# Patient Record
Sex: Male | Born: 1965 | Race: White | Hispanic: No | Smoking: Current every day smoker
Health system: Southern US, Community
[De-identification: ages and names within clinical notes are randomized; demographics above are authoritative.]

## PROBLEM LIST (undated history)

## (undated) DIAGNOSIS — G4730 Sleep apnea, unspecified: Secondary | ICD-10-CM

## (undated) DIAGNOSIS — T33521A Superficial frostbite of right hand, initial encounter: Secondary | ICD-10-CM

## (undated) DIAGNOSIS — K635 Polyp of colon: Secondary | ICD-10-CM

## (undated) DIAGNOSIS — Z21 Asymptomatic human immunodeficiency virus [HIV] infection status: Secondary | ICD-10-CM

## (undated) DIAGNOSIS — B20 Human immunodeficiency virus [HIV] disease: Secondary | ICD-10-CM

## (undated) DIAGNOSIS — T7840XA Allergy, unspecified, initial encounter: Secondary | ICD-10-CM

## (undated) DIAGNOSIS — A630 Anogenital (venereal) warts: Secondary | ICD-10-CM

## (undated) DIAGNOSIS — I10 Essential (primary) hypertension: Secondary | ICD-10-CM

## (undated) DIAGNOSIS — J449 Chronic obstructive pulmonary disease, unspecified: Secondary | ICD-10-CM

## (undated) HISTORY — DX: Allergy, unspecified, initial encounter: T78.40XA

## (undated) HISTORY — DX: Human immunodeficiency virus (HIV) disease: B20

## (undated) HISTORY — DX: Polyp of colon: K63.5

## (undated) HISTORY — DX: Sleep apnea, unspecified: G47.30

## (undated) HISTORY — DX: Essential (primary) hypertension: I10

## (undated) HISTORY — DX: Asymptomatic human immunodeficiency virus (hiv) infection status: Z21

## (undated) HISTORY — DX: Anogenital (venereal) warts: A63.0

## (undated) HISTORY — DX: Chronic obstructive pulmonary disease, unspecified: J44.9

---

## 2018-03-03 ENCOUNTER — Ambulatory Visit: Payer: BLUE CROSS/BLUE SHIELD | Admitting: Adult Health

## 2018-03-03 ENCOUNTER — Encounter: Payer: Self-pay | Admitting: Adult Health

## 2018-03-03 VITALS — BP 124/88 | HR 75 | Temp 98.2°F | Resp 16 | Ht 69.5 in | Wt 180.0 lb

## 2018-03-03 DIAGNOSIS — J209 Acute bronchitis, unspecified: Secondary | ICD-10-CM | POA: Insufficient documentation

## 2018-03-03 DIAGNOSIS — J440 Chronic obstructive pulmonary disease with acute lower respiratory infection: Secondary | ICD-10-CM | POA: Diagnosis not present

## 2018-03-03 DIAGNOSIS — B20 Human immunodeficiency virus [HIV] disease: Secondary | ICD-10-CM

## 2018-03-03 DIAGNOSIS — Z1211 Encounter for screening for malignant neoplasm of colon: Secondary | ICD-10-CM

## 2018-03-03 DIAGNOSIS — F419 Anxiety disorder, unspecified: Secondary | ICD-10-CM

## 2018-03-03 DIAGNOSIS — J0110 Acute frontal sinusitis, unspecified: Secondary | ICD-10-CM

## 2018-03-03 DIAGNOSIS — I10 Essential (primary) hypertension: Secondary | ICD-10-CM

## 2018-03-03 DIAGNOSIS — I270 Primary pulmonary hypertension: Secondary | ICD-10-CM | POA: Insufficient documentation

## 2018-03-03 DIAGNOSIS — B001 Herpesviral vesicular dermatitis: Secondary | ICD-10-CM

## 2018-03-03 DIAGNOSIS — F17219 Nicotine dependence, cigarettes, with unspecified nicotine-induced disorders: Secondary | ICD-10-CM

## 2018-03-03 MED ORDER — VALACYCLOVIR HCL 1 G PO TABS: 1000.0000 mg | ORAL_TABLET | Freq: Two times a day (BID) | ORAL | 1 refills | Status: DC

## 2018-03-03 MED ORDER — HYDROXYZINE HCL 25 MG PO TABS: 25.0000 mg | ORAL_TABLET | Freq: Every evening | ORAL | 0 refills | Status: DC | PRN

## 2018-03-03 MED ORDER — ALBUTEROL SULFATE HFA 108 (90 BASE) MCG/ACT IN AERS: 1.0000 | INHALATION_SPRAY | Freq: Four times a day (QID) | RESPIRATORY_TRACT | 2 refills | Status: DC | PRN

## 2018-03-03 MED ORDER — AMOXICILLIN-POT CLAVULANATE 875-125 MG PO TABS: 1.0000 | ORAL_TABLET | Freq: Two times a day (BID) | ORAL | 0 refills | Status: DC

## 2018-03-03 MED ORDER — LISINOPRIL 10 MG PO TABS: 10.0000 mg | ORAL_TABLET | Freq: Every day | ORAL | 0 refills | Status: DC

## 2018-03-03 NOTE — Progress Notes (Signed)
[facility] [place], [place] [address]  Internal MEDICINE  Office Visit Note  Patient Name: [name]  [id number]  [id number]  Date of Service: 03/03/2018   Complaints/HPI Pt is here for establishment of PCP. Chief Complaint  Patient presents with  . 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 Hypertension  . COPD    needs refills   . Allergies  . Quality Metric Gaps    colonoscopy  . Sinusitis    ongoing sinus infection   HPI Patient is a well-appearing 52 year old Caucasian male.  He reports moving to area a few months ago for a new job.  He moved here alone and does not know anyone have any family in the area.  He is here today to establish primary care.  He reports a medical history that includes: HIV, hypertension, COPD, and allergies.  He also reports some significant anxiety surrounding his medical treatment.  He states that his anxiety has been bad since having to make multiple doctors appointments and get his care established here.  He states he has been much more stressful than he had anticipated.  Patient reports he has been referred to infectious disease at [facility] monitor his HIV.  He states he is been undetectable for the last 9 years and his infectious disease physician in [place] where he moved from referred him to [facility].  He is already been in establish care with them they will continue to write his antiviral medications.  He is in need of a colonoscopy to close his quality metric gaps he is agreeable to this at this visit.  His main complaint today is sinus infection as well as ongoing allergy symptoms.  He reports multiple weeks of sinus pain and pressure as well as rhinitis and postnasal drip.  He reports he is been taking some over-the-counter medications with no relief.  Current Medication: Outpatient Encounter Medications as of 03/03/2018  Medication Sig  . albuterol (VENTOLIN HFA) 108 (90 Base) MCG/ACT inhaler Inhale 1-2 puffs into the  lungs every 6 (six) hours as needed for wheezing or shortness of breath.  . lisinopril (PRINIVIL,ZESTRIL) 10 MG tablet Take 1 tablet (10 mg total) by mouth daily.  . mirtazapine (REMERON) 15 MG tablet   . TRIUMEQ 600-50-300 MG tablet   . valACYclovir (VALTREX) 1000 MG tablet Take 1 tablet (1,000 mg total) by mouth 2 (two) times daily.  . [DISCONTINUED] albuterol (VENTOLIN HFA) 108 (90 Base) MCG/ACT inhaler Inhale 1-2 puffs into the lungs every 6 (six) hours as needed for wheezing or shortness of breath.  . [DISCONTINUED] lisinopril (PRINIVIL,ZESTRIL) 10 MG tablet   . [DISCONTINUED] valACYclovir (VALTREX) 1000 MG tablet   . amoxicillin-clavulanate (AUGMENTIN) 875-125 MG tablet Take 1 tablet by mouth 2 (two) times daily.  . hydrOXYzine (ATARAX/VISTARIL) 25 MG tablet Take 1 tablet (25 mg total) by mouth at bedtime as needed.   No facility-administered encounter medications on file as of 03/03/2018.     Surgical History: History reviewed. No pertinent surgical history.  Medical History: Past Medical History:  Diagnosis Date  . Allergy   . COPD (chronic obstructive pulmonary disease) ([place])   . HIV (human immunodeficiency virus infection) ([place])   . Hypertension     Family History: Family History  Adopted: Yes    Social History   Socioeconomic History  . Marital status: Divorced    Spouse name: Not on file  . Number of children: Not on file  . Years of  education: Not on file  . Highest education level: Not on file  Occupational History  . Not on file  Social Needs  . Financial resource strain: Not on file  . Food insecurity:    Worry: Not on file    Inability: Not on file  . Transportation needs:    Medical: Not on file    Non-medical: Not on file  Tobacco Use  . Smoking status: Current Every Day Smoker    Packs/day: 1.00    Types: Cigarettes  . Smokeless tobacco: Never Used  Substance and Sexual Activity  . Alcohol use: Yes  . Drug use: Never  . Sexual activity: Not  on file  Lifestyle  . Physical activity:    Days per week: Not on file    Minutes per session: Not on file  . Stress: Not on file  Relationships  . Social connections:    Talks on phone: Not on file    Gets together: Not on file    Attends religious service: Not on file    Active member of club or organization: Not on file    Attends meetings of clubs or organizations: Not on file    Relationship status: Not on file  . Intimate partner violence:    Fear of current or ex partner: Not on file    Emotionally abused: Not on file    Physically abused: Not on file    Forced sexual activity: Not on file  Other Topics Concern  . Not on file  Social History Narrative  . Not on file     Review of Systems  Constitutional: Negative.  Negative for chills, fatigue and unexpected weight change.  HENT: Positive for sinus pressure and sinus pain. Negative for congestion, rhinorrhea, sneezing and sore throat.   Eyes: Negative for redness.  Respiratory: Negative.  Negative for cough, chest tightness and shortness of breath.   Cardiovascular: Negative.  Negative for chest pain and palpitations.  Gastrointestinal: Negative.  Negative for abdominal pain, constipation, diarrhea, nausea and vomiting.  Endocrine: Negative.   Genitourinary: Negative.  Negative for dysuria and frequency.  Musculoskeletal: Negative.  Negative for arthralgias, back pain, joint swelling and neck pain.  Skin: Negative.  Negative for rash.  Allergic/Immunologic: Negative.   Neurological: Negative.  Negative for tremors and numbness.  Hematological: Negative for adenopathy. Does not bruise/bleed easily.  Psychiatric/Behavioral: Negative.  Negative for behavioral problems, sleep disturbance and suicidal ideas. The patient is not nervous/anxious.     Vital Signs: BP 124/88 (BP Location: Left Arm, Patient Position: Sitting, Cuff Size: Normal)   Pulse 75   Temp 98.2 F (36.8 C) (Oral)   Resp 16   Ht 5' 9.5" (1.765 m)    Wt 180 lb (81.6 kg)   SpO2 97%   BMI 26.20 kg/m    Physical Exam  Constitutional: He is oriented to person, place, and time. He appears well-developed and well-nourished. No distress.  HENT:  Head: Normocephalic and atraumatic.  Mouth/Throat: Oropharynx is clear and moist. No oropharyngeal exudate.  Eyes: Pupils are equal, round, and reactive to light. EOM are normal.  Neck: Normal range of motion. Neck supple. No JVD present. No tracheal deviation present. No thyromegaly present.  Cardiovascular: Normal rate, regular rhythm and normal heart sounds. Exam reveals no gallop and no friction rub.  No murmur heard. Pulmonary/Chest: Effort normal and breath sounds normal. No respiratory distress. He has no wheezes. He has no rales. He exhibits no tenderness.  Abdominal: Soft. There  is no tenderness. There is no guarding.  Musculoskeletal: Normal range of motion.  Lymphadenopathy:    He has no cervical adenopathy.  Neurological: He is alert and oriented to person, place, and time. No cranial nerve deficit.  Skin: Skin is warm and dry. He is not diaphoretic.  Psychiatric: He has a normal mood and affect. His behavior is normal. Judgment and thought content normal.  Nursing note and vitals reviewed.  Assessment/Plan: 1. Anxiety Patient reports he has been taking Remeron at night for his anxiety, however he does not feel this is working had to stop taking it a few months ago.  He states that he had been on clonazepam at some point at night for his anxiety however he does not want to take any from the strong.  We discussed a few options including SSRIs but the patient settled on taking a small dose of hydroxyzine and see how that does for him.  He reports difficulty falling asleep and staying asleep due to his anxiety and feels like the hydroxyzine will help him with his sleeping as well as anxiety.  2. Acute non-recurrent frontal sinusitis Patient reports feeling feverish and chilly at times over  the last few days.  No matter how much over-the-counter medication he takes he still has sinus drainage and pressure.  Given the symptoms a couple of Augmentin is prescribed.  Patient is instructed to return to clinic if symptoms do not improve in the next 5 to 7 days. - amoxicillin-clavulanate (AUGMENTIN) 875-125 MG tablet; Take 1 tablet by mouth 2 (two) times daily.  Dispense: 20 tablet; Refill: 0  3. HIV infection, unspecified symptom status ([place]) Patient will see [facility] infectious disease for ongoing HIV surveillance.  Per patient is currently undetectable and is on the following medication. - TRIUMEQ 600-50-300 MG tablet  4. Acute bronchitis with COPD [facility]) Patient reports using her inhaler for his COPD.  Unfortunately he does continue to smoke greater than 1 pack of cigarettes per day.  Albuterol inhaler refilled at this time. - albuterol (VENTOLIN HFA) 108 (90 Base) MCG/ACT inhaler; Inhale 1-2 puffs into the lungs every 6 (six) hours as needed for wheezing or shortness of breath.  Dispense: 1 Inhaler; Refill: 2  5. 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  He keeps a prescription for valacyclovir on hand for these outbreak.  He reports his last outbreak approximately 2 months ago.  Patient Valtrex refilled at this time. - valACYclovir (VALTREX) 1000 MG tablet; Take 1 tablet (1,000 mg total) by mouth 2 (two) times daily.  Dispense: 20 tablet; Refill: 1  6. Hypertension, unspecified type Refill patient's lisinopril.  Blood pressure appears stable at this time. - lisinopril (PRINIVIL,ZESTRIL) 10 MG tablet; Take 1 tablet (10 mg total) by mouth daily.  Dispense: 30 tablet; Refill: 0  7. Screen for colon cancer Referral for GI place the patient could have screening colonoscopy. - Ambulatory referral to Gastroenterology  8. Cigarette nicotine dependence with nicotine-induced disorder Patient reports smoking just over 1 pack of cigarette daily.  He reports his anxiety is  to have a time for him to try to quit and he hopes to start the quitting process once he has stabilized all his medical appointments and has establish his routine locally. Smoking cessation counseling: 1. Pt acknowledges the risks of long term smoking, she will try to quite smoking. 2. Options for different medications including nicotine products, chewing gum, patch etc, Wellbutrin and Chantix is discussed 3. Goal  and date of compete cessation is discussed 4. Total time spent in smoking cessation is 15 min.   General Counseling: Marylou Mccoy understanding of the findings of todays visit and agrees with plan of treatment. I have discussed any further diagnostic evaluation that may be needed or ordered today. We also reviewed his medications today. he has been encouraged to call the office with any questions or concerns that should arise related to todays visit.  Orders Placed This Encounter  Procedures  . CBC with Differential/Platelet  . Lipid Panel With LDL/HDL Ratio  . TSH  . T4, free  . Comprehensive metabolic panel  . PSA  . Ambulatory referral to Gastroenterology    Meds ordered this encounter  Medications  . albuterol (VENTOLIN HFA) 108 (90 Base) MCG/ACT inhaler    Sig: Inhale 1-2 puffs into the lungs every 6 (six) hours as needed for wheezing or shortness of breath.    Dispense:  1 Inhaler    Refill:  2  . amoxicillin-clavulanate (AUGMENTIN) 875-125 MG tablet    Sig: Take 1 tablet by mouth 2 (two) times daily.    Dispense:  20 tablet    Refill:  0  . lisinopril (PRINIVIL,ZESTRIL) 10 MG tablet    Sig: Take 1 tablet (10 mg total) by mouth daily.    Dispense:  30 tablet    Refill:  0  . valACYclovir (VALTREX) 1000 MG tablet    Sig: Take 1 tablet (1,000 mg total) by mouth 2 (two) times daily.    Dispense:  20 tablet    Refill:  1  . hydrOXYzine (ATARAX/VISTARIL) 25 MG tablet    Sig: Take 1 tablet (25 mg total) by mouth at bedtime as needed.    Dispense:  30 tablet     Refill:  0    Time spent: 25 Minutes   This patient was seen by Orson Gear AGNP-C in Collaboration with Dr Lavera Guise as a part of collaborative care agreement  Kendell Bane AGNP-C Internal Medicine

## 2018-03-03 NOTE — Patient Instructions (Signed)

## 2018-03-11 ENCOUNTER — Encounter: Payer: Self-pay | Admitting: Adult Health

## 2018-03-24 ENCOUNTER — Encounter: Payer: Self-pay | Admitting: *Deleted

## 2018-03-24 DIAGNOSIS — Z113 Encounter for screening for infections with a predominantly sexual mode of transmission: Secondary | ICD-10-CM | POA: Diagnosis not present

## 2018-03-24 DIAGNOSIS — I10 Essential (primary) hypertension: Secondary | ICD-10-CM | POA: Diagnosis not present

## 2018-03-24 DIAGNOSIS — N50819 Testicular pain, unspecified: Secondary | ICD-10-CM | POA: Diagnosis not present

## 2018-03-24 DIAGNOSIS — Z720 Tobacco use: Secondary | ICD-10-CM | POA: Diagnosis not present

## 2018-03-24 DIAGNOSIS — B20 Human immunodeficiency virus [HIV] disease: Secondary | ICD-10-CM | POA: Diagnosis not present

## 2018-03-24 DIAGNOSIS — Z23 Encounter for immunization: Secondary | ICD-10-CM | POA: Diagnosis not present

## 2018-03-26 ENCOUNTER — Other Ambulatory Visit: Payer: Self-pay | Admitting: Adult Health

## 2018-03-26 DIAGNOSIS — F419 Anxiety disorder, unspecified: Secondary | ICD-10-CM

## 2018-03-26 MED ORDER — HYDROXYZINE HCL 25 MG PO TABS: 25.0000 mg | ORAL_TABLET | Freq: Every evening | ORAL | 1 refills | Status: DC | PRN

## 2018-04-27 ENCOUNTER — Encounter: Payer: Self-pay | Admitting: Adult Health

## 2018-04-27 ENCOUNTER — Ambulatory Visit (INDEPENDENT_AMBULATORY_CARE_PROVIDER_SITE_OTHER): Payer: BC Managed Care – PPO | Admitting: Adult Health

## 2018-04-27 VITALS — BP 126/86 | HR 79 | Resp 16 | Ht 69.5 in | Wt 178.8 lb

## 2018-04-27 DIAGNOSIS — Z0001 Encounter for general adult medical examination with abnormal findings: Secondary | ICD-10-CM | POA: Diagnosis not present

## 2018-04-27 DIAGNOSIS — I10 Essential (primary) hypertension: Secondary | ICD-10-CM

## 2018-04-27 DIAGNOSIS — R300 Dysuria: Secondary | ICD-10-CM

## 2018-04-27 DIAGNOSIS — H6123 Impacted cerumen, bilateral: Secondary | ICD-10-CM | POA: Diagnosis not present

## 2018-04-27 DIAGNOSIS — Z1211 Encounter for screening for malignant neoplasm of colon: Secondary | ICD-10-CM

## 2018-04-27 DIAGNOSIS — F1010 Alcohol abuse, uncomplicated: Secondary | ICD-10-CM

## 2018-04-27 DIAGNOSIS — F419 Anxiety disorder, unspecified: Secondary | ICD-10-CM

## 2018-04-27 DIAGNOSIS — B20 Human immunodeficiency virus [HIV] disease: Secondary | ICD-10-CM

## 2018-04-27 DIAGNOSIS — F17209 Nicotine dependence, unspecified, with unspecified nicotine-induced disorders: Secondary | ICD-10-CM | POA: Diagnosis not present

## 2018-04-27 MED ORDER — MIRTAZAPINE 15 MG PO TABS: 15.0000 mg | ORAL_TABLET | Freq: Every day | ORAL | 3 refills | Status: DC

## 2018-04-27 MED ORDER — CARBAMIDE PEROXIDE 6.5 % OT SOLN: 5.0000 [drp] | Freq: Two times a day (BID) | OTIC | 0 refills | Status: DC

## 2018-04-27 NOTE — Patient Instructions (Signed)
Coping with Quitting Smoking  Quitting smoking is a physical and mental challenge. You will face cravings, withdrawal symptoms, and temptation. Before quitting, work with your health care provider to make a plan that can help you cope. Preparation can help you quit and keep you from giving in. How can I cope with cravings? Cravings usually last for 5-10 minutes. If you get through it, the craving will pass. Consider taking the following actions to help you cope with cravings:  Keep your mouth busy: ? Chew sugar-free gum. ? Suck on hard candies or a straw. ? Brush your teeth.  Keep your hands and body busy: ? Immediately change to a different activity when you feel a craving. ? Squeeze or play with a ball. ? Do an activity or a hobby, like making bead jewelry, practicing needlepoint, or working with wood. ? Mix up your normal routine. ? Take a short exercise break. Go for a quick walk or run up and down stairs. ? Spend time in public places where smoking is not allowed.  Focus on doing something kind or helpful for someone else.  Call a friend or family member to talk during a craving.  Join a support group.  Call a quit line, such as 1-800-QUIT-NOW.  Talk with your health care provider about medicines that might help you cope with cravings and make quitting easier for you. How can I deal with withdrawal symptoms? Your body may experience negative effects as it tries to get used to not having nicotine in the system. These effects are called withdrawal symptoms. They may include:  Feeling hungrier than normal.  Trouble concentrating.  Irritability.  Trouble sleeping.  Feeling depressed.  Restlessness and agitation.  Craving a cigarette. To manage withdrawal symptoms:  Avoid places, people, and activities that trigger your cravings.  Remember why you want to quit.  Get plenty of sleep.  Avoid coffee and other caffeinated drinks. These may worsen some of your symptoms.  How can I handle social situations? Social situations can be difficult when you are quitting smoking, especially in the first few weeks. To manage this, you can:  Avoid parties, bars, and other social situations where people might be smoking.  Avoid alcohol.  Leave right away if you have the urge to smoke.  Explain to your family and friends that you are quitting smoking. Ask for understanding and support.  Plan activities with friends or family where smoking is not an option. What are some ways I can cope with stress? Wanting to smoke may cause stress, and stress can make you want to smoke. Find ways to manage your stress. Relaxation techniques can help. For example:  Breathe slowly and deeply, in through your nose and out through your mouth.  Listen to soothing, relaxing music.  Talk with a family member or friend about your stress.  Light a candle.  Soak in a bath or take a shower.  Think about a peaceful place. What are some ways I can prevent weight gain? Be aware that many people gain weight after they quit smoking. However, not everyone does. To keep from gaining weight, have a plan in place before you quit and stick to the plan after you quit. Your plan should include:  Having healthy snacks. When you have a craving, it may help to: ? Eat plain popcorn, crunchy carrots, celery, or other cut vegetables. ? Chew sugar-free gum.  Changing how you eat: ? Eat small portion sizes at meals. ? Eat 4-6 small meals   throughout the day instead of 1-2 large meals a day. ? Be mindful when you eat. Do not watch television or do other things that might distract you as you eat.  Exercising regularly: ? Make time to exercise each day. If you do not have time for a long workout, do short bouts of exercise for 5-10 minutes several times a day. ? Do some form of strengthening exercise, like weight lifting, and some form of aerobic exercise, like running or swimming.  Drinking plenty of  water or other low-calorie or no-calorie drinks. Drink 6-8 glasses of water daily, or as much as instructed by your health care provider. Summary  Quitting smoking is a physical and mental challenge. You will face cravings, withdrawal symptoms, and temptation to smoke again. Preparation can help you as you go through these challenges.  You can cope with cravings by keeping your mouth busy (such as by chewing gum), keeping your body and hands busy, and making calls to family, friends, or a helpline for people who want to quit smoking.  You can cope with withdrawal symptoms by avoiding places where people smoke, avoiding drinks with caffeine, and getting plenty of rest.  Ask your health care provider about the different ways to prevent weight gain, avoid stress, and handle social situations. This information is not intended to replace advice given to you by your health care provider. Make sure you discuss any questions you have with your health care provider. Document Released: 04/05/2016 Document Revised: 04/05/2016 Document Reviewed: 04/05/2016 [employer] Interactive Patient Education  2019 [employer]  

## 2018-04-27 NOTE — Progress Notes (Signed)
[facility] [place], [place] [address]  Internal MEDICINE  Office Visit Note  Patient Name: [name]  [id number]  [id number]  Date of Service: 04/27/2018  Chief Complaint  Patient presents with  . Annual Exam  . HIV Positive/AIDS  . Hypertension  . COPD  . Quality Metric Gaps    colonoscopy     HPI Pt is here for routine health maintenance examination.  He is a well appearing 53 yo male. He has a history of HIV for over 10 years.  He also has HTN, COPD.  Generally he is doing well and denies any issues currently.  His blood pressures well controlled on current medications.  He has just seen infectious disease last month at [facility] and his HIV remains undetectable.  He denies any issues with his breathing however he does report that he smokes over 1 pack of cigarettes per day still.  He also reports that he drinks approximately 5 drinks every Saturday and Sunday.  He typically does not drink during the week.  He is in need of a colonoscopy to close his quality metric gaps. He has a referral already, and needs to reschedule, as he could not make his first appt.    Current Medication: Outpatient Encounter Medications as of 04/27/2018  Medication Sig  . albuterol (VENTOLIN HFA) 108 (90 Base) MCG/ACT inhaler Inhale 1-2 puffs into the lungs every 6 (six) hours as needed for wheezing or shortness of breath.  . bictegravir-emtricitabine-tenofovir AF (BIKTARVY) 50-200-25 MG TABS tablet Take by mouth.  . hydrOXYzine (ATARAX/VISTARIL) 25 MG tablet Take 1 tablet (25 mg total) by mouth at bedtime as needed.  Marland Kitchen lisinopril (PRINIVIL,ZESTRIL) 10 MG tablet Take 1 tablet (10 mg total) by mouth daily.  . mirtazapine (REMERON) 15 MG tablet Take 1 tablet (15 mg total) by mouth at bedtime.  . valACYclovir (VALTREX) 1000 MG tablet Take 1 tablet (1,000 mg total) by mouth 2 (two) times daily.  . [DISCONTINUED] mirtazapine (REMERON) 15 MG tablet   . amoxicillin-clavulanate (AUGMENTIN)  875-125 MG tablet Take 1 tablet by mouth 2 (two) times daily. (Patient not taking: Reported on 04/27/2018)  . carbamide peroxide (DEBROX) 6.5 % OTIC solution Place 5 drops into both ears 2 (two) times daily.  Perlie Gold 810-17-510 MG tablet    No facility-administered encounter medications on file as of 04/27/2018.     Surgical History: History reviewed. No pertinent surgical history.  Medical History: Past Medical History:  Diagnosis Date  . Allergy   . COPD (chronic obstructive pulmonary disease) ([place])   . HIV (human immunodeficiency virus infection) ([place])   . Hypertension     Family History: Family History  Adopted: Yes      Review of Systems  Constitutional: Negative.  Negative for chills, fatigue and unexpected weight change.  HENT: Negative.  Negative for congestion, rhinorrhea, sneezing and sore throat.   Eyes: Negative for redness.  Respiratory: Negative.  Negative for cough, chest tightness and shortness of breath.   Cardiovascular: Negative.  Negative for chest pain and palpitations.  Gastrointestinal: Negative.  Negative for abdominal pain, constipation, diarrhea, nausea and vomiting.  Endocrine: Negative.   Genitourinary: Negative.  Negative for dysuria and frequency.  Musculoskeletal: Negative.  Negative for arthralgias, back pain, joint swelling and neck pain.  Skin: Negative.  Negative for rash.  Allergic/Immunologic: Negative.   Neurological: Negative.  Negative for tremors and numbness.  Hematological: Negative for adenopathy. Does not bruise/bleed easily.  Psychiatric/Behavioral: Negative.  Negative for  behavioral problems, sleep disturbance and suicidal ideas. The patient is not nervous/anxious.      Vital Signs: BP 126/86   Pulse 79   Resp 16   Ht 5' 9.5" (1.765 m)   Wt 178 lb 12.8 oz (81.1 kg)   SpO2 96%   BMI 26.03 kg/m    Physical Exam Vitals signs and nursing note reviewed.  Constitutional:      General: He is not in acute distress.     Appearance: He is well-developed. He is not diaphoretic.  HENT:     Head: Normocephalic and atraumatic.     Mouth/Throat:     Pharynx: No oropharyngeal exudate.  Eyes:     Pupils: Pupils are equal, round, and reactive to light.  Neck:     Musculoskeletal: Normal range of motion and neck supple.     Thyroid: No thyromegaly.     Vascular: No JVD.     Trachea: No tracheal deviation.  Cardiovascular:     Rate and Rhythm: Normal rate and regular rhythm.     Heart sounds: Normal heart sounds. No murmur. No friction rub. No gallop.   Pulmonary:     Effort: Pulmonary effort is normal. No respiratory distress.     Breath sounds: Normal breath sounds. No wheezing or rales.  Chest:     Chest wall: No tenderness.  Abdominal:     Palpations: Abdomen is soft.     Tenderness: There is no abdominal tenderness. There is no guarding.  Genitourinary:    Rectum: Normal.  Musculoskeletal: Normal range of motion.  Lymphadenopathy:     Cervical: No cervical adenopathy.  Skin:    General: Skin is warm and dry.  Neurological:     Mental Status: He is alert and oriented to person, place, and time.     Cranial Nerves: No cranial nerve deficit.  Psychiatric:        Behavior: Behavior normal.        Thought Content: Thought content normal.        Judgment: Judgment normal.     LABS: No results found for this or any previous visit (from the past 2160 hour(s)).  Assessment/Plan: 1. Encounter for general adult medical examination with abnormal findings Patient is been on preventative health maintenance except for colonoscopy which he is attempting to schedule at this time. - Lipid Panel With LDL/HDL Ratio - TSH - T4, free - Comprehensive metabolic panel - PSA  2. HIV infection, unspecified symptom status ([place]) Patient is being followed [facility] infectious disease at [facility].  His current viral load is undetectable.  He will continue take his medications and followed by infectious disease. -  bictegravir-emtricitabine-tenofovir AF (BIKTARVY) 50-200-25 MG TABS tablet; Take by mouth.  3. Hypertension, unspecified type Stable, continue current medications.  4. Bilateral impacted cerumen Patient provided with Debrox drops.  He has bilateral impacted ear canals.  Instructed patient to use drops as directed for 710 days and then he can return to office for ear irrigation. - carbamide peroxide (DEBROX) 6.5 % OTIC solution; Place 5 drops into both ears 2 (two) times daily.  Dispense: 15 mL; Refill: 0  5. Nicotine dependence with nicotine-induced disorder, unspecified nicotine product type Unfortunately patient continues to smoke greater than 1 pack of cigarettes per day. Smoking cessation counseling: 1. Pt acknowledges the risks of long term smoking, she will try to quite smoking. 2. Options for different medications including nicotine products, chewing gum, patch etc, Wellbutrin and Chantix is discussed  3. Goal and date of compete cessation is discussed 4. Total time spent in smoking cessation is 15 min.   6. Screen for colon cancer Patient has referral for colonoscopy.  He had to reschedule since he cannot make his first appointment.  Patient reports he will do that at this time.  7. Alcohol consumption binge drinking Patient reports drinking 5 mixed drinks on Saturday and 5 mixed drinks on Sunday every weekend.  He does not feel like he has an alcohol problem or that he needs to cut down at this time.  8. Anxiety Renewed patient's Remeron. - mirtazapine (REMERON) 15 MG tablet; Take 1 tablet (15 mg total) by mouth at bedtime.  Dispense: 30 tablet; Refill: 3  9. Dysuria - UA/M w/rflx Culture, Routine  General Counseling: Desmen verbalizes understanding of the findings of todays visit and agrees with plan of treatment. I have discussed any further diagnostic evaluation that may be needed or ordered today. We also reviewed his medications today. he has been encouraged to call the  office with any questions or concerns that should arise related to todays visit.   Orders Placed This Encounter  Procedures  . UA/M w/rflx Culture, Routine  . Lipid Panel With LDL/HDL Ratio  . TSH  . T4, free  . Comprehensive metabolic panel  . PSA    Meds ordered this encounter  Medications  . carbamide peroxide (DEBROX) 6.5 % OTIC solution    Sig: Place 5 drops into both ears 2 (two) times daily.    Dispense:  15 mL    Refill:  0  . mirtazapine (REMERON) 15 MG tablet    Sig: Take 1 tablet (15 mg total) by mouth at bedtime.    Dispense:  30 tablet    Refill:  3    Time spent: 35 Minutes   This patient was seen by Orson Gear AGNP-C in Collaboration with Dr Lavera Guise as a part of collaborative care agreement    Kendell Bane AGNP-C Internal Medicine

## 2018-04-28 LAB — UA/M W/RFLX CULTURE, ROUTINE
Bilirubin, UA: NEGATIVE
Glucose, UA: NEGATIVE
Ketones, UA: NEGATIVE
LEUKOCYTES UA: NEGATIVE
Nitrite, UA: NEGATIVE
PH UA: 5.5 (ref 5.0–7.5)
PROTEIN UA: NEGATIVE
RBC UA: NEGATIVE
SPEC GRAV UA: 1.011 (ref 1.005–1.030)
Urobilinogen, Ur: 0.2 mg/dL (ref 0.2–1.0)

## 2018-04-28 LAB — MICROSCOPIC EXAMINATION
Bacteria, UA: NONE SEEN
CASTS: NONE SEEN /LPF
Epithelial Cells (non renal): NONE SEEN /hpf (ref 0–10)

## 2018-05-06 DIAGNOSIS — Z0001 Encounter for general adult medical examination with abnormal findings: Secondary | ICD-10-CM | POA: Diagnosis not present

## 2018-05-07 DIAGNOSIS — Z6825 Body mass index (BMI) 25.0-25.9, adult: Secondary | ICD-10-CM | POA: Diagnosis not present

## 2018-05-07 DIAGNOSIS — Z23 Encounter for immunization: Secondary | ICD-10-CM | POA: Diagnosis not present

## 2018-05-07 DIAGNOSIS — B20 Human immunodeficiency virus [HIV] disease: Secondary | ICD-10-CM | POA: Diagnosis not present

## 2018-05-07 LAB — COMPREHENSIVE METABOLIC PANEL
ALT: 19 IU/L (ref 0–44)
AST: 34 IU/L (ref 0–40)
Albumin/Globulin Ratio: 1.8 (ref 1.2–2.2)
Albumin: 4.4 g/dL (ref 3.5–5.5)
Alkaline Phosphatase: 67 IU/L (ref 39–117)
BUN/Creatinine Ratio: 9 (ref 9–20)
BUN: 9 mg/dL (ref 6–24)
Bilirubin Total: 1 mg/dL (ref 0.0–1.2)
CO2: 24 mmol/L (ref 20–29)
Calcium: 8.8 mg/dL (ref 8.7–10.2)
Chloride: 103 mmol/L (ref 96–106)
Creatinine, Ser: 0.98 mg/dL (ref 0.76–1.27)
GFR calc Af Amer: 103 mL/min/{1.73_m2} (ref >59)
GFR calc non Af Amer: 90 mL/min/{1.73_m2} (ref >59)
Globulin, Total: 2.5 g/dL (ref 1.5–4.5)
Glucose: 82 mg/dL (ref 65–99)
Potassium: 4.2 mmol/L (ref 3.5–5.2)
Sodium: 141 mmol/L (ref 134–144)
Total Protein: 6.9 g/dL (ref 6.0–8.5)

## 2018-05-07 LAB — LIPID PANEL WITH LDL/HDL RATIO
Cholesterol, Total: 148 mg/dL (ref 100–199)
HDL: 60 mg/dL (ref >39)
LDL Calculated: 40 mg/dL (ref 0–99)
LDL/HDL RATIO: 0.7 ratio (ref 0.0–3.6)
Triglycerides: 242 mg/dL — ABNORMAL HIGH (ref 0–149)
VLDL Cholesterol Cal: 48 mg/dL — ABNORMAL HIGH (ref 5–40)

## 2018-05-07 LAB — PSA: Prostate Specific Ag, Serum: 0.8 ng/mL (ref 0.0–4.0)

## 2018-05-07 LAB — TSH: TSH: 1.25 u[IU]/mL (ref 0.450–4.500)

## 2018-05-07 LAB — T4, FREE: Free T4: 1.23 ng/dL (ref 0.82–1.77)

## 2018-05-21 ENCOUNTER — Other Ambulatory Visit: Payer: Self-pay

## 2018-05-21 DIAGNOSIS — F419 Anxiety disorder, unspecified: Secondary | ICD-10-CM

## 2018-05-21 MED ORDER — MIRTAZAPINE 15 MG PO TABS: 15.0000 mg | ORAL_TABLET | Freq: Every day | ORAL | 1 refills | Status: DC

## 2018-06-20 ENCOUNTER — Other Ambulatory Visit: Payer: Self-pay | Admitting: Adult Health

## 2018-06-20 DIAGNOSIS — B001 Herpesviral vesicular dermatitis: Secondary | ICD-10-CM

## 2018-06-22 ENCOUNTER — Telehealth: Payer: Self-pay | Admitting: Gastroenterology

## 2018-06-22 NOTE — Telephone Encounter (Signed)
LVM for pt to call office to schedule colonoscopy.  Thanks [name] 

## 2018-06-22 NOTE — Telephone Encounter (Signed)
Patient returned called after receiving letter to schedule procedure.

## 2018-07-06 ENCOUNTER — Other Ambulatory Visit: Payer: Self-pay

## 2018-07-06 ENCOUNTER — Encounter: Payer: Self-pay | Admitting: Adult Health

## 2018-07-06 ENCOUNTER — Ambulatory Visit: Payer: BC Managed Care – PPO | Admitting: Adult Health

## 2018-07-06 VITALS — BP 150/86 | HR 87 | Temp 98.5°F | Resp 16 | Ht 69.5 in | Wt 173.0 lb

## 2018-07-06 DIAGNOSIS — F17209 Nicotine dependence, unspecified, with unspecified nicotine-induced disorders: Secondary | ICD-10-CM

## 2018-07-06 DIAGNOSIS — I10 Essential (primary) hypertension: Secondary | ICD-10-CM

## 2018-07-06 DIAGNOSIS — J0110 Acute frontal sinusitis, unspecified: Secondary | ICD-10-CM | POA: Diagnosis not present

## 2018-07-06 DIAGNOSIS — T7840XA Allergy, unspecified, initial encounter: Secondary | ICD-10-CM

## 2018-07-06 MED ORDER — AMOXICILLIN-POT CLAVULANATE 875-125 MG PO TABS: 1.0000 | ORAL_TABLET | Freq: Two times a day (BID) | ORAL | 0 refills | Status: DC

## 2018-07-06 NOTE — Progress Notes (Signed)
[facility] [place], [place] [address]  Internal MEDICINE  Office Visit Note  Patient Name: [name]  [id number]  [id number]  Date of Service: 07/06/2018  Chief Complaint  Patient presents with  . Sinusitis    chills and sweating no fever      HPI Pt is here for a sick visit. Pt reports he was at the gym and noticed he felt chilled, and started sweating. He was on the treadmill at the time. He has felt feverish since.  He is concerned because of his history of HIV. He came in today to have his temperature checked. He denies ingesting any new substances, or foods.  His medications have not changed.    Current Medication:  Outpatient Encounter Medications as of 07/06/2018  Medication Sig  . albuterol (VENTOLIN HFA) 108 (90 Base) MCG/ACT inhaler Inhale 1-2 puffs into the lungs every 6 (six) hours as needed for wheezing or shortness of breath.  . bictegravir-emtricitabine-tenofovir AF (BIKTARVY) 50-200-25 MG TABS tablet Take by mouth.  . carbamide peroxide (DEBROX) 6.5 % OTIC solution Place 5 drops into both ears 2 (two) times daily.  . hydrOXYzine (ATARAX/VISTARIL) 25 MG tablet Take 1 tablet (25 mg total) by mouth at bedtime as needed.  Marland Kitchen lisinopril (PRINIVIL,ZESTRIL) 10 MG tablet Take 1 tablet (10 mg total) by mouth daily.  . mirtazapine (REMERON) 15 MG tablet Take 1 tablet (15 mg total) by mouth at bedtime.  . TRIUMEQ 600-50-300 MG tablet   . valACYclovir (VALTREX) 1000 MG tablet TAKE 1 TABLET BY MOUTH TWICE A DAY  . [DISCONTINUED] amoxicillin-clavulanate (AUGMENTIN) 875-125 MG tablet Take 1 tablet by mouth 2 (two) times daily. (Patient not taking: Reported on 04/27/2018)   No facility-administered encounter medications on file as of 07/06/2018.       Medical History: Past Medical History:  Diagnosis Date  . Allergy   . COPD (chronic obstructive pulmonary disease) ([place])   . HIV (human immunodeficiency virus infection) ([place])   . Hypertension       Vital Signs: BP (!) 150/86   Pulse 87   Temp 98.5 F (36.9 C)   Resp 16   Ht 5' 9.5" (1.765 m)   Wt 173 lb (78.5 kg)   SpO2 95%   BMI 25.18 kg/m    Review of Systems  Constitutional: Negative.  Negative for chills, fatigue and unexpected weight change.  HENT: Positive for rhinorrhea. Negative for congestion, sneezing and sore throat.   Eyes: Negative for redness.  Respiratory: Negative.  Negative for cough, chest tightness and shortness of breath.   Cardiovascular: Negative.  Negative for chest pain and palpitations.  Gastrointestinal: Negative.  Negative for abdominal pain, constipation, diarrhea, nausea and vomiting.  Endocrine: Negative.   Genitourinary: Negative.  Negative for dysuria and frequency.  Musculoskeletal: Negative.  Negative for arthralgias, back pain, joint swelling and neck pain.  Skin: Negative.  Negative for rash.  Allergic/Immunologic: Negative.   Neurological: Negative.  Negative for tremors and numbness.  Hematological: Negative for adenopathy. Does not bruise/bleed easily.  Psychiatric/Behavioral: Negative.  Negative for behavioral problems, sleep disturbance and suicidal ideas. The patient is not nervous/anxious.     Physical Exam Vitals signs and nursing note reviewed.  Constitutional:      General: He is not in acute distress.    Appearance: He is well-developed. He is not diaphoretic.  HENT:     Head: Normocephalic and atraumatic.     Mouth/Throat:     Pharynx: No oropharyngeal exudate.  Eyes:     Pupils: Pupils are equal, round, and reactive to light.  Neck:     Musculoskeletal: Normal range of motion and neck supple.     Thyroid: No thyromegaly.     Vascular: No JVD.     Trachea: No tracheal deviation.  Cardiovascular:     Rate and Rhythm: Normal rate and regular rhythm.     Heart sounds: Normal heart sounds. No murmur. No friction rub. No gallop.   Pulmonary:     Effort: Pulmonary effort is normal. No respiratory distress.      Breath sounds: Normal breath sounds. No wheezing or rales.  Chest:     Chest wall: No tenderness.  Abdominal:     Palpations: Abdomen is soft.     Tenderness: There is no abdominal tenderness. There is no guarding.  Musculoskeletal: Normal range of motion.  Lymphadenopathy:     Cervical: No cervical adenopathy.  Skin:    General: Skin is warm and dry.  Neurological:     Mental Status: He is alert and oriented to person, place, and time.     Cranial Nerves: No cranial nerve deficit.  Psychiatric:        Behavior: Behavior normal.        Thought Content: Thought content normal.        Judgment: Judgment normal.    Assessment/Plan: 1. Allergic state, initial encounter Advised patient since [place] is [place] to the [place], he should take a daily allergy medication like Zyrtec, or Claritin. Also suggested flonase.  He verbalized understanding and is agreeable to the plan.    2. Acute non-recurrent frontal sinusitis Pt provided with course of augmentin for watch and wait. Instructed patient that if he develops a fever, or sinus pressure/pain increases he can start taking augmentin. - amoxicillin-clavulanate (AUGMENTIN) 875-125 MG tablet; Take 1 tablet by mouth 2 (two) times daily.  Dispense: 14 tablet; Refill: 0  3. Hypertension, unspecified type BP rechecked, 132/86.  Will continue to follow at future visits.   4. Nicotine dependence with nicotine-induced disorder, unspecified nicotine product type Smoking cessation counseling: 1. Pt acknowledges the risks of long term smoking, she will try to quite smoking. 2. Options for different medications including nicotine products, chewing gum, patch etc, Wellbutrin and Chantix is discussed 3. Goal and date of compete cessation is discussed 4. Total time spent in smoking cessation is 15 min.  General Counseling: Marylou Mccoy understanding of the findings of todays visit and agrees with plan of treatment. I have discussed any further diagnostic  evaluation that may be needed or ordered today. We also reviewed his medications today. he has been encouraged to call the office with any questions or concerns that should arise related to todays visit.   No orders of the defined types were placed in this encounter.   No orders of the defined types were placed in this encounter.   Time spent:25 Minutes  This patient was seen by Orson Gear AGNP-C in Collaboration with Dr Lavera Guise as a part of collaborative care agreement.  Kendell Bane AGNP-C Internal Medicine

## 2018-07-06 NOTE — Progress Notes (Signed)
bp elevated

## 2018-08-03 ENCOUNTER — Encounter: Payer: Self-pay | Admitting: *Deleted

## 2018-09-07 ENCOUNTER — Telehealth: Payer: Self-pay

## 2018-09-07 ENCOUNTER — Other Ambulatory Visit: Payer: Self-pay

## 2018-09-07 DIAGNOSIS — F419 Anxiety disorder, unspecified: Secondary | ICD-10-CM

## 2018-09-07 MED ORDER — HYDROXYZINE HCL 25 MG PO TABS: 25.0000 mg | ORAL_TABLET | Freq: Every evening | ORAL | 1 refills | Status: DC | PRN

## 2018-09-07 NOTE — Telephone Encounter (Signed)
Called pt to schedule a general fu. His hydroxyzine has been sent but will need a follow up appt for further refills.

## 2018-09-21 ENCOUNTER — Ambulatory Visit: Payer: BLUE CROSS/BLUE SHIELD | Admitting: Adult Health

## 2018-10-14 ENCOUNTER — Encounter: Payer: Self-pay | Admitting: Adult Health

## 2018-10-14 ENCOUNTER — Ambulatory Visit: Payer: BC Managed Care – PPO | Admitting: Adult Health

## 2018-10-14 ENCOUNTER — Other Ambulatory Visit: Payer: Self-pay

## 2018-10-14 VITALS — BP 125/85 | HR 100 | Resp 16 | Ht 69.5 in | Wt 174.0 lb

## 2018-10-14 DIAGNOSIS — B001 Herpesviral vesicular dermatitis: Secondary | ICD-10-CM

## 2018-10-14 DIAGNOSIS — J209 Acute bronchitis, unspecified: Secondary | ICD-10-CM

## 2018-10-14 DIAGNOSIS — J440 Chronic obstructive pulmonary disease with acute lower respiratory infection: Secondary | ICD-10-CM

## 2018-10-14 DIAGNOSIS — F17209 Nicotine dependence, unspecified, with unspecified nicotine-induced disorders: Secondary | ICD-10-CM | POA: Diagnosis not present

## 2018-10-14 DIAGNOSIS — I10 Essential (primary) hypertension: Secondary | ICD-10-CM | POA: Diagnosis not present

## 2018-10-14 DIAGNOSIS — K1370 Unspecified lesions of oral mucosa: Secondary | ICD-10-CM

## 2018-10-14 DIAGNOSIS — F419 Anxiety disorder, unspecified: Secondary | ICD-10-CM

## 2018-10-14 DIAGNOSIS — F17200 Nicotine dependence, unspecified, uncomplicated: Secondary | ICD-10-CM

## 2018-10-14 MED ORDER — LISINOPRIL 10 MG PO TABS: 10.0000 mg | ORAL_TABLET | Freq: Every day | ORAL | 0 refills | Status: DC

## 2018-10-14 MED ORDER — MIRTAZAPINE 15 MG PO TABS: 15.0000 mg | ORAL_TABLET | Freq: Every day | ORAL | 3 refills | Status: DC

## 2018-10-14 MED ORDER — ALBUTEROL SULFATE HFA 108 (90 BASE) MCG/ACT IN AERS: 1.0000 | INHALATION_SPRAY | Freq: Four times a day (QID) | RESPIRATORY_TRACT | 3 refills | Status: DC | PRN

## 2018-10-14 MED ORDER — CHANTIX STARTING MONTH PAK 0.5 MG X 11 & 1 MG X 42 PO TABS: ORAL_TABLET | ORAL | 0 refills | Status: DC

## 2018-10-14 MED ORDER — VALACYCLOVIR HCL 1 G PO TABS: 1000.0000 mg | ORAL_TABLET | Freq: Two times a day (BID) | ORAL | 1 refills | Status: DC

## 2018-10-14 MED ORDER — HYDROXYZINE HCL 25 MG PO TABS: 25.0000 mg | ORAL_TABLET | Freq: Every evening | ORAL | 1 refills | Status: DC | PRN

## 2018-10-14 MED ORDER — MIRTAZAPINE 15 MG PO TABS: 15.0000 mg | ORAL_TABLET | Freq: Every day | ORAL | 1 refills | Status: DC

## 2018-10-14 NOTE — Progress Notes (Signed)
[facility] [place], [place] [address]  Internal MEDICINE  Office Visit Note  Patient Name: [name]  [id number]  [id number]  Date of Service: 10/14/2018  Chief Complaint  Patient presents with  . Medical Management of Chronic Issues  . Anxiety  . Hypertension    HPI  Pt is here for follow up on HTN, anxiety, and copd.  He report his bp has been well controlled. Denies Chest pain, Shortness of breath, palpitations, headache, or blurred vision. He reports some increased anxiety due to covid-19 however he has found it to be manageable.  He unfortunately is continuing to smoke cigarettes, about 2 packs per day.  He is requesting chantix at this time to help him stop.    Current Medication: Outpatient Encounter Medications as of 10/14/2018  Medication Sig  . albuterol (VENTOLIN HFA) 108 (90 Base) MCG/ACT inhaler Inhale 1-2 puffs into the lungs every 6 (six) hours as needed for wheezing or shortness of breath.  . bictegravir-emtricitabine-tenofovir AF (BIKTARVY) 50-200-25 MG TABS tablet Take by mouth.  . hydrOXYzine (ATARAX/VISTARIL) 25 MG tablet Take 1 tablet (25 mg total) by mouth at bedtime as needed.  Marland Kitchen lisinopril (ZESTRIL) 10 MG tablet Take 1 tablet (10 mg total) by mouth daily.  . mirtazapine (REMERON) 15 MG tablet Take 1 tablet (15 mg total) by mouth at bedtime.  . TRIUMEQ 600-50-300 MG tablet   . valACYclovir (VALTREX) 1000 MG tablet Take 1 tablet (1,000 mg total) by mouth 2 (two) times daily.  . [DISCONTINUED] albuterol (VENTOLIN HFA) 108 (90 Base) MCG/ACT inhaler Inhale 1-2 puffs into the lungs every 6 (six) hours as needed for wheezing or shortness of breath.  . [DISCONTINUED] hydrOXYzine (ATARAX/VISTARIL) 25 MG tablet Take 1 tablet (25 mg total) by mouth at bedtime as needed.  . [DISCONTINUED] lisinopril (PRINIVIL,ZESTRIL) 10 MG tablet Take 1 tablet (10 mg total) by mouth daily.  . [DISCONTINUED] mirtazapine (REMERON) 15 MG tablet Take 1 tablet (15  mg total) by mouth at bedtime.  . [DISCONTINUED] valACYclovir (VALTREX) 1000 MG tablet TAKE 1 TABLET BY MOUTH TWICE A DAY  . varenicline (CHANTIX STARTING MONTH PAK) 0.5 MG X 11 & 1 MG X 42 tablet Take one 0.5 mg tablet by mouth once daily for 3 days, then increase to one 0.5 mg tablet twice daily for 4 days, then increase to one 1 mg tablet twice daily.  . [DISCONTINUED] amoxicillin-clavulanate (AUGMENTIN) 875-125 MG tablet Take 1 tablet by mouth 2 (two) times daily. (Patient not taking: Reported on 10/14/2018)  . [DISCONTINUED] carbamide peroxide (DEBROX) 6.5 % OTIC solution Place 5 drops into both ears 2 (two) times daily. (Patient not taking: Reported on 10/14/2018)   No facility-administered encounter medications on file as of 10/14/2018.     Surgical History: No past surgical history on file.  Medical History: Past Medical History:  Diagnosis Date  . Allergy   . COPD (chronic obstructive pulmonary disease) ([place])   . HIV (human immunodeficiency virus infection) ([place])   . Hypertension     Family History: Family History  Adopted: Yes    Social History   Socioeconomic History  . Marital status: Divorced    Spouse name: Not on file  . Number of children: Not on file  . Years of education: Not on file  . Highest education level: Not on file  Occupational History  . Not on file  Social Needs  . Financial resource strain: Not on file  . Food insecurity  Worry: Not on file    Inability: Not on file  . Transportation needs    Medical: Not on file    Non-medical: Not on file  Tobacco Use  . Smoking status: Current Every Day Smoker    Packs/day: 1.00    Types: Cigarettes  . Smokeless tobacco: Never Used  Substance and Sexual Activity  . Alcohol use: Yes  . Drug use: Never  . Sexual activity: Not on file  Lifestyle  . Physical activity    Days per week: Not on file    Minutes per session: Not on file  . Stress: Not on file  Relationships  . Social Product manager on phone: Not on file    Gets together: Not on file    Attends religious service: Not on file    Active member of club or organization: Not on file    Attends meetings of clubs or organizations: Not on file    Relationship status: Not on file  . Intimate partner violence    Fear of current or ex partner: Not on file    Emotionally abused: Not on file    Physically abused: Not on file    Forced sexual activity: Not on file  Other Topics Concern  . Not on file  Social History Narrative  . Not on file      Review of Systems  Constitutional: Negative.  Negative for chills, fatigue and unexpected weight change.  HENT: Negative.  Negative for congestion, rhinorrhea, sneezing and sore throat.   Eyes: Negative for redness.  Respiratory: Negative.  Negative for cough, chest tightness and shortness of breath.   Cardiovascular: Negative.  Negative for chest pain and palpitations.  Gastrointestinal: Negative.  Negative for abdominal pain, constipation, diarrhea, nausea and vomiting.  Endocrine: Negative.   Genitourinary: Negative.  Negative for dysuria and frequency.  Musculoskeletal: Negative.  Negative for arthralgias, back pain, joint swelling and neck pain.  Skin: Negative.  Negative for rash.  Allergic/Immunologic: Negative.   Neurological: Negative.  Negative for tremors and numbness.  Hematological: Negative for adenopathy. Does not bruise/bleed easily.  Psychiatric/Behavioral: Negative.  Negative for behavioral problems, sleep disturbance and suicidal ideas. The patient is not nervous/anxious.     Vital Signs: BP 125/85   Pulse 100   Resp 16   Ht 5' 9.5" (1.765 m)   Wt 174 lb (78.9 kg)   SpO2 97%   BMI 25.33 kg/m    Physical Exam Vitals signs and nursing note reviewed.  Constitutional:      General: He is not in acute distress.    Appearance: He is well-developed. He is not diaphoretic.  HENT:     Head: Normocephalic and atraumatic.     Mouth/Throat:      Pharynx: No oropharyngeal exudate.  Eyes:     Pupils: Pupils are equal, round, and reactive to light.  Neck:     Musculoskeletal: Normal range of motion and neck supple.     Thyroid: No thyromegaly.     Vascular: No JVD.     Trachea: No tracheal deviation.  Cardiovascular:     Rate and Rhythm: Normal rate and regular rhythm.     Heart sounds: Normal heart sounds. No murmur. No friction rub. No gallop.   Pulmonary:     Effort: Pulmonary effort is normal. No respiratory distress.     Breath sounds: Normal breath sounds. No wheezing or rales.  Chest:     Chest wall: No tenderness.  Abdominal:     Palpations: Abdomen is soft.     Tenderness: There is no abdominal tenderness. There is no guarding.  Musculoskeletal: Normal range of motion.  Lymphadenopathy:     Cervical: No cervical adenopathy.  Skin:    General: Skin is warm and dry.  Neurological:     Mental Status: He is alert and oriented to person, place, and time.     Cranial Nerves: No cranial nerve deficit.  Psychiatric:        Behavior: Behavior normal.        Thought Content: Thought content normal.        Judgment: Judgment normal.     Assessment/Plan: 1. Hypertension, unspecified type Stable, refilled lisinopril, continue taking as directed.  - lisinopril (ZESTRIL) 10 MG tablet; Take 1 tablet (10 mg total) by mouth daily.  Dispense: 30 tablet; Refill: 0  2. Anxiety Refilled patients remeron and hydroxizine at this time.  - mirtazapine (REMERON) 15 MG tablet; Take 1 tablet (15 mg total) by mouth at bedtime.  Dispense: 90 tablet; Refill: 1 - hydrOXYzine (ATARAX/VISTARIL) 25 MG tablet; Take 1 tablet (25 mg total) by mouth at bedtime as needed.  Dispense: 90 tablet; Refill: 1  3. Nicotine dependence with nicotine-induced disorder, unspecified nicotine product type Smoking cessation counseling: 1. Pt acknowledges the risks of long term smoking, she will try to quite smoking. 2. Options for different medications  including nicotine products, chewing gum, patch etc, Wellbutrin and Chantix is discussed 3. Goal and date of compete cessation is discussed 4. Total time spent in smoking cessation is 15 min. - varenicline (CHANTIX STARTING MONTH PAK) 0.5 MG X 11 & 1 MG X 42 tablet; Take one 0.5 mg tablet by mouth once daily for 3 days, then increase to one 0.5 mg tablet twice daily for 4 days, then increase to one 1 mg tablet twice daily.  Dispense: 53 tablet; Refill: 0  4. Cold sore Pt reports last outbreak one month ago, refilled medication at this time.  - valACYclovir (VALTREX) 1000 MG tablet; Take 1 tablet (1,000 mg total) by mouth 2 (two) times daily.  Dispense: 20 tablet; Refill: 1  5. Acute bronchitis with COPD ([place]) Refilled albuterol at this time.  - albuterol (VENTOLIN HFA) 108 (90 Base) MCG/ACT inhaler; Inhale 1-2 puffs into the lungs every 6 (six) hours as needed for wheezing or shortness of breath.  Dispense: 18 g; Refill: 3  7. Oral mucosal lesion Pt reports a place in the roof of his mouth that has been there for a few months.  He is able to "pull it off" at times but it keeps returning. - Ambulatory referral to ENT  General Counseling: Marylou Mccoy understanding of the findings of todays visit and agrees with plan of treatment. I have discussed any further diagnostic evaluation that may be needed or ordered today. We also reviewed his medications today. he has been encouraged to call the office with any questions or concerns that should arise related to todays visit.    No orders of the defined types were placed in this encounter.   Meds ordered this encounter  Medications  . mirtazapine (REMERON) 15 MG tablet    Sig: Take 1 tablet (15 mg total) by mouth at bedtime.    Dispense:  90 tablet    Refill:  1  . lisinopril (ZESTRIL) 10 MG tablet    Sig: Take 1 tablet (10 mg total) by mouth daily.    Dispense:  30 tablet  Refill:  0  . hydrOXYzine (ATARAX/VISTARIL) 25 MG tablet     Sig: Take 1 tablet (25 mg total) by mouth at bedtime as needed.    Dispense:  90 tablet    Refill:  1  . valACYclovir (VALTREX) 1000 MG tablet    Sig: Take 1 tablet (1,000 mg total) by mouth 2 (two) times daily.    Dispense:  20 tablet    Refill:  1  . albuterol (VENTOLIN HFA) 108 (90 Base) MCG/ACT inhaler    Sig: Inhale 1-2 puffs into the lungs every 6 (six) hours as needed for wheezing or shortness of breath.    Dispense:  18 g    Refill:  3  . varenicline (CHANTIX STARTING MONTH PAK) 0.5 MG X 11 & 1 MG X 42 tablet    Sig: Take one 0.5 mg tablet by mouth once daily for 3 days, then increase to one 0.5 mg tablet twice daily for 4 days, then increase to one 1 mg tablet twice daily.    Dispense:  53 tablet    Refill:  0    Time spent: 20 Minutes   This patient was seen by Orson Gear AGNP-C in Collaboration with Dr Lavera Guise as a part of collaborative care agreement     Kendell Bane AGNP-C Internal medicine

## 2018-10-28 DIAGNOSIS — B20 Human immunodeficiency virus [HIV] disease: Secondary | ICD-10-CM | POA: Diagnosis not present

## 2018-11-04 DIAGNOSIS — Z7289 Other problems related to lifestyle: Secondary | ICD-10-CM | POA: Diagnosis not present

## 2018-11-04 DIAGNOSIS — B20 Human immunodeficiency virus [HIV] disease: Secondary | ICD-10-CM | POA: Diagnosis not present

## 2018-11-04 DIAGNOSIS — F17210 Nicotine dependence, cigarettes, uncomplicated: Secondary | ICD-10-CM | POA: Insufficient documentation

## 2018-11-04 DIAGNOSIS — Z7189 Other specified counseling: Secondary | ICD-10-CM | POA: Diagnosis not present

## 2018-11-04 DIAGNOSIS — F5104 Psychophysiologic insomnia: Secondary | ICD-10-CM | POA: Insufficient documentation

## 2018-11-04 DIAGNOSIS — Z720 Tobacco use: Secondary | ICD-10-CM | POA: Diagnosis not present

## 2018-11-05 DIAGNOSIS — B20 Human immunodeficiency virus [HIV] disease: Secondary | ICD-10-CM | POA: Diagnosis not present

## 2018-11-05 DIAGNOSIS — Z23 Encounter for immunization: Secondary | ICD-10-CM | POA: Diagnosis not present

## 2018-11-06 ENCOUNTER — Other Ambulatory Visit: Payer: Self-pay

## 2018-11-06 DIAGNOSIS — F419 Anxiety disorder, unspecified: Secondary | ICD-10-CM

## 2018-11-06 MED ORDER — HYDROXYZINE HCL 25 MG PO TABS: 25.0000 mg | ORAL_TABLET | Freq: Two times a day (BID) | ORAL | 0 refills | Status: DC

## 2018-11-15 ENCOUNTER — Other Ambulatory Visit: Payer: Self-pay | Admitting: Adult Health

## 2018-11-15 DIAGNOSIS — F17209 Nicotine dependence, unspecified, with unspecified nicotine-induced disorders: Secondary | ICD-10-CM

## 2018-12-03 ENCOUNTER — Other Ambulatory Visit: Payer: Self-pay | Admitting: Adult Health

## 2018-12-03 DIAGNOSIS — J209 Acute bronchitis, unspecified: Secondary | ICD-10-CM

## 2018-12-04 ENCOUNTER — Other Ambulatory Visit: Payer: Self-pay | Admitting: Adult Health

## 2018-12-04 DIAGNOSIS — F419 Anxiety disorder, unspecified: Secondary | ICD-10-CM

## 2018-12-04 MED ORDER — HYDROXYZINE HCL 25 MG PO TABS: 25.0000 mg | ORAL_TABLET | Freq: Two times a day (BID) | ORAL | 2 refills | Status: DC

## 2019-01-02 DIAGNOSIS — Z23 Encounter for immunization: Secondary | ICD-10-CM | POA: Diagnosis not present

## 2019-01-07 ENCOUNTER — Other Ambulatory Visit: Payer: Self-pay | Admitting: Adult Health

## 2019-01-07 DIAGNOSIS — F419 Anxiety disorder, unspecified: Secondary | ICD-10-CM

## 2019-01-26 ENCOUNTER — Telehealth: Payer: Self-pay | Admitting: Gastroenterology

## 2019-01-26 NOTE — Telephone Encounter (Signed)
Pt left vm to schedule colonoscopy  °

## 2019-01-27 NOTE — Telephone Encounter (Signed)
Returned patients call to schedule colonoscopy.  He will need to check with his insurance to make sure our providers are in network.  Thanks [employer]

## 2019-02-11 ENCOUNTER — Encounter: Payer: Self-pay | Admitting: Adult Health

## 2019-02-11 ENCOUNTER — Ambulatory Visit: Payer: BC Managed Care – PPO | Admitting: Internal Medicine

## 2019-02-11 ENCOUNTER — Other Ambulatory Visit: Payer: Self-pay

## 2019-02-11 DIAGNOSIS — I10 Essential (primary) hypertension: Secondary | ICD-10-CM

## 2019-02-11 DIAGNOSIS — M542 Cervicalgia: Secondary | ICD-10-CM | POA: Diagnosis not present

## 2019-02-11 DIAGNOSIS — M25552 Pain in left hip: Secondary | ICD-10-CM | POA: Diagnosis not present

## 2019-02-11 DIAGNOSIS — F411 Generalized anxiety disorder: Secondary | ICD-10-CM

## 2019-02-11 DIAGNOSIS — R0602 Shortness of breath: Secondary | ICD-10-CM

## 2019-02-11 DIAGNOSIS — J449 Chronic obstructive pulmonary disease, unspecified: Secondary | ICD-10-CM

## 2019-02-11 NOTE — Progress Notes (Signed)
[facility] [place], [place] [address]  Internal MEDICINE  Office Visit Note  Patient Name: [name]  [id number]  [id number]  Date of Service: 02/18/2019  Chief Complaint  Patient presents with  . HIV Positive/AIDS  . Hypertension  . Medical Management of Chronic Issues    muscle pain in left leg  . Cough    HPI  Pt is here for routine follow up, His anxiety is still not well controlled, he is still having sleeping problems and feels anxious. C/O left hip pain in the center of buttock, denies any radiation, denies any rashes, Has been under the care of ID for his AIDS/HIV management. BP is well controlled as well. Pt has congested cough( smoker's cough)clears after coughing however there is an audible wheeze. Takes albuterol as needed but has been using it more frequently, denies any chest pain   Current Medication: Outpatient Encounter Medications as of 02/11/2019  Medication Sig  . albuterol (VENTOLIN HFA) 108 (90 Base) MCG/ACT inhaler INHALE 1-2 PUFFS INTO THE LUNGS EVERY 6 (SIX) HOURS AS NEEDED FOR WHEEZING OR SHORTNESS OF BREATH.  . bictegravir-emtricitabine-tenofovir AF (BIKTARVY) 50-200-25 MG TABS tablet Take by mouth.  . hydrOXYzine (ATARAX/VISTARIL) 25 MG tablet Take 1 tablet (25 mg total) by mouth 2 (two) times daily.  Marland Kitchen lisinopril (ZESTRIL) 10 MG tablet Take 1 tablet (10 mg total) by mouth daily.  . TRIUMEQ 600-50-300 MG tablet   . valACYclovir (VALTREX) 1000 MG tablet Take 1 tablet (1,000 mg total) by mouth 2 (two) times daily.  . varenicline (CHANTIX STARTING MONTH PAK) 0.5 MG X 11 & 1 MG X 42 tablet Take one 0.5 mg tablet by mouth once daily for 3 days, then increase to one 0.5 mg tablet twice daily for 4 days, then increase to one 1 mg tablet twice daily.  . [DISCONTINUED] mirtazapine (REMERON) 15 MG tablet TAKE 1 TABLET BY MOUTH EVERYDAY AT BEDTIME   No facility-administered encounter medications on file as of 02/11/2019.     Surgical  History: History reviewed. No pertinent surgical history.  Medical History: Past Medical History:  Diagnosis Date  . Allergy   . COPD (chronic obstructive pulmonary disease) ([place])   . HIV (human immunodeficiency virus infection) ([place])   . Hypertension     Family History: Family History  Adopted: Yes    Social History   Socioeconomic History  . Marital status: Divorced    Spouse name: Not on file  . Number of children: Not on file  . Years of education: Not on file  . Highest education level: Not on file  Occupational History  . Not on file  Social Needs  . Financial resource strain: Not on file  . Food insecurity    Worry: Not on file    Inability: Not on file  . Transportation needs    Medical: Not on file    Non-medical: Not on file  Tobacco Use  . Smoking status: Current Every Day Smoker    Packs/day: 1.00    Types: Cigarettes  . Smokeless tobacco: Never Used  Substance and Sexual Activity  . Alcohol use: Yes  . Drug use: Never  . Sexual activity: Not on file  Lifestyle  . Physical activity    Days per week: Not on file    Minutes per session: Not on file  . Stress: Not on file  Relationships  . Social Herbalist on phone: Not on file    Gets  together: Not on file    Attends religious service: Not on file    Active member of club or organization: Not on file    Attends meetings of clubs or organizations: Not on file    Relationship status: Not on file  . Intimate partner violence    Fear of current or ex partner: Not on file    Emotionally abused: Not on file    Physically abused: Not on file    Forced sexual activity: Not on file  Other Topics Concern  . Not on file  Social History Narrative  . Not on file    Review of Systems  Constitutional: Negative for chills, fatigue and unexpected weight change.  HENT: Positive for postnasal drip. Negative for congestion, rhinorrhea, sneezing and sore throat.   Eyes: Negative for redness.   Respiratory: Positive for cough and wheezing. Negative for chest tightness and shortness of breath.   Cardiovascular: Negative for chest pain and palpitations.  Gastrointestinal: Negative for abdominal pain, constipation, diarrhea, nausea and vomiting.  Genitourinary: Negative for dysuria and frequency.  Musculoskeletal: Positive for back pain and neck pain. Negative for arthralgias and joint swelling.       Left hip pain   Skin: Negative for rash.  Neurological: Negative.  Negative for tremors and numbness.  Hematological: Negative for adenopathy. Does not bruise/bleed easily.  Psychiatric/Behavioral: Positive for sleep disturbance. Negative for behavioral problems (Depression) and suicidal ideas. The patient is nervous/anxious.    Vital Signs: BP 110/81   Pulse 87   Temp (!) 97.1 F (36.2 C)   Resp 16   Ht 5' 9.5" (1.765 m)   Wt 172 lb (78 kg)   SpO2 94%   BMI 25.04 kg/m   Physical Exam Constitutional:      General: He is not in acute distress.    Appearance: He is well-developed. He is not diaphoretic.  HENT:     Head: Normocephalic and atraumatic.     Mouth/Throat:     Pharynx: No oropharyngeal exudate.  Eyes:     Pupils: Pupils are equal, round, and reactive to light.  Neck:     Musculoskeletal: Normal range of motion and neck supple.     Thyroid: No thyromegaly.     Vascular: No JVD.     Trachea: No tracheal deviation.  Cardiovascular:     Rate and Rhythm: Normal rate and regular rhythm.     Heart sounds: Normal heart sounds. No murmur. No friction rub. No gallop.   Pulmonary:     Effort: Pulmonary effort is normal. No respiratory distress.     Breath sounds: No wheezing or rales.  Chest:     Chest wall: No tenderness.  Abdominal:     General: Bowel sounds are normal.     Palpations: Abdomen is soft.  Musculoskeletal: Normal range of motion.     Comments: Exam is benign, straight leg raising is negative, good ROM  Lymphadenopathy:     Cervical: No cervical  adenopathy.  Skin:    General: Skin is warm and dry.  Neurological:     Mental Status: He is alert and oriented to person, place, and time.     Cranial Nerves: No cranial nerve deficit.  Psychiatric:        Behavior: Behavior normal.        Thought Content: Thought content normal.        Judgment: Judgment normal.    Assessment/Plan: 1. Chronic obstructive pulmonary disease, unspecified COPD type ([place]) -  Spirometry with Graph, Abnormal, pt will need PFT's and CXR, samples of Dulera is given   2. Left hip pain X-ray ordered, might need further testing if condition is not improved   3. Neck pain C spine Xray is ordered. Flexeril prn as prescribed   4. Hypertension, unspecified type Well controlled, continue meds   5. Generalized anxiety disorder Increase Remeron to 30 mg po qhs, side effects were discussed with him, add Lexapro 10 mg once a day as well   General Counseling: Marylou Mccoy understanding of the findings of todays visit and agrees with plan of treatment. I have discussed any further diagnostic evaluation that may be needed or ordered today. We also reviewed his medications today. he has been encouraged to call the office with any questions or concerns that should arise related to todays visit.  Orders Placed This Encounter  Procedures  . Spirometry with Graph   Orders Placed This Encounter  Procedures  . Spirometry with Graph   Time spent:25 Minutes  Dr Lavera Guise Internal medicine

## 2019-02-12 ENCOUNTER — Telehealth: Payer: Self-pay

## 2019-02-12 ENCOUNTER — Other Ambulatory Visit: Payer: Self-pay

## 2019-02-12 MED ORDER — ESCITALOPRAM OXALATE 10 MG PO TABS: ORAL_TABLET | ORAL | 1 refills | Status: DC

## 2019-02-12 MED ORDER — MIRTAZAPINE 30 MG PO TABS: 30.0000 mg | ORAL_TABLET | Freq: Every day | ORAL | 1 refills | Status: DC

## 2019-02-12 MED ORDER — CYCLOBENZAPRINE HCL 10 MG PO TABS: 10.0000 mg | ORAL_TABLET | Freq: Two times a day (BID) | ORAL | 0 refills | Status: DC | PRN

## 2019-02-12 NOTE — Telephone Encounter (Signed)
Pt advised we send med to [facility] we add muscle relaxer and increase remeron and also add lexapro  And dulera try on see how he is doing sample given at visit

## 2019-02-12 NOTE — Telephone Encounter (Signed)
As per dr Humphrey Rolls send Remeron 30 mg take 1 tab po at bed time,Lexapro 10 mg  1 tab po with supper and flexeril 10 mg 1 tab po BID prn send to [facility]

## 2019-02-18 ENCOUNTER — Other Ambulatory Visit: Payer: Self-pay | Admitting: Internal Medicine

## 2019-02-18 ENCOUNTER — Telehealth: Payer: Self-pay

## 2019-02-18 DIAGNOSIS — M25552 Pain in left hip: Secondary | ICD-10-CM

## 2019-02-18 DIAGNOSIS — M542 Cervicalgia: Secondary | ICD-10-CM

## 2019-02-18 NOTE — Telephone Encounter (Signed)
-----   Message from Lavera Guise, MD sent at 02/17/2019  2:56 PM EDT ----- ok ----- Message ----- From: Laurie Panda Sent: 02/17/2019  11:46 AM EDT To: Lavera Guise, MD  Pt called c/o left hip pain worsening and meds not really helping and he called requesting to have a x ray of left hip and see if he could also do a neck x ray so we could have result for his appt in 2 weeks, can I put order in for these x rays? Beth

## 2019-02-18 NOTE — Telephone Encounter (Signed)
Left patient message advising him of x ray orders and to callus if he has any further questions. Beth

## 2019-02-19 ENCOUNTER — Ambulatory Visit
Admission: RE | Admit: 2019-02-19 | Discharge: 2019-02-19 | Disposition: A | Discharge status: Home / Self Care | Payer: BC Managed Care – PPO | Source: Ambulatory Visit | Attending: Internal Medicine | Admitting: Internal Medicine

## 2019-02-19 ENCOUNTER — Other Ambulatory Visit: Payer: Self-pay

## 2019-02-19 DIAGNOSIS — M542 Cervicalgia: Secondary | ICD-10-CM | POA: Diagnosis not present

## 2019-02-19 DIAGNOSIS — M1612 Unilateral primary osteoarthritis, left hip: Secondary | ICD-10-CM | POA: Diagnosis not present

## 2019-02-19 DIAGNOSIS — M25552 Pain in left hip: Secondary | ICD-10-CM

## 2019-02-22 NOTE — Progress Notes (Signed)
Patient scheduled to see Adam 03/02/2019

## 2019-02-23 ENCOUNTER — Telehealth: Payer: Self-pay

## 2019-02-23 ENCOUNTER — Other Ambulatory Visit: Payer: Self-pay

## 2019-02-23 DIAGNOSIS — Z1211 Encounter for screening for malignant neoplasm of colon: Secondary | ICD-10-CM

## 2019-02-23 NOTE — Telephone Encounter (Signed)
Gastroenterology Pre-Procedure Review  Request Date: 03/22/19 Requesting Physician: Dr. Allen Norris  PATIENT REVIEW QUESTIONS: The patient responded to the following health history questions as indicated:    1. Are you having any GI issues? no 2. Do you have a personal history of Polyps? no 3. Do you have a family history of Colon Cancer or Polyps? no 4. Diabetes Mellitus? no 5. Joint replacements in the past 12 months?no 6. Major health problems in the past 3 months?no 7. Any artificial heart valves, MVP, or defibrillator?no  8. Pulmonary Health:COPD    MEDICATIONS & ALLERGIES:    Patient reports the following regarding taking any anticoagulation/antiplatelet therapy:   Plavix, Coumadin, Eliquis, Xarelto, Lovenox, Pradaxa, Brilinta, or Effient? no Aspirin? no  Patient confirms/reports the following medications:  Current Outpatient Medications  Medication Sig Dispense Refill  . albuterol (VENTOLIN HFA) 108 (90 Base) MCG/ACT inhaler INHALE 1-2 PUFFS INTO THE LUNGS EVERY 6 (SIX) HOURS AS NEEDED FOR WHEEZING OR SHORTNESS OF BREATH. 18 g 2  . bictegravir-emtricitabine-tenofovir AF (BIKTARVY) 50-200-25 MG TABS tablet Take by mouth.    . cyclobenzaprine (FLEXERIL) 10 MG tablet Take 1 tablet (10 mg total) by mouth 2 (two) times daily as needed for muscle spasms. 45 tablet 0  . escitalopram (LEXAPRO) 10 MG tablet Take 1 tab po daily with supper 30 tablet 1  . hydrOXYzine (ATARAX/VISTARIL) 25 MG tablet Take 1 tablet (25 mg total) by mouth 2 (two) times daily. 180 tablet 2  . lisinopril (ZESTRIL) 10 MG tablet Take 1 tablet (10 mg total) by mouth daily. 30 tablet 0  . mirtazapine (REMERON) 30 MG tablet Take 1 tablet (30 mg total) by mouth at bedtime. 30 tablet 1  . TRIUMEQ 600-50-300 MG tablet     . valACYclovir (VALTREX) 1000 MG tablet Take 1 tablet (1,000 mg total) by mouth 2 (two) times daily. 20 tablet 1  . varenicline (CHANTIX STARTING MONTH PAK) 0.5 MG X 11 & 1 MG X 42 tablet Take one 0.5 mg  tablet by mouth once daily for 3 days, then increase to one 0.5 mg tablet twice daily for 4 days, then increase to one 1 mg tablet twice daily. 53 tablet 0   No current facility-administered medications for this visit.     Patient confirms/reports the following allergies:  No Known Allergies  No orders of the defined types were placed in this encounter.   AUTHORIZATION INFORMATION Primary Insurance: 1D#: Group #:  Secondary Insurance: 1D#: Group #:  SCHEDULE INFORMATION: Date: 03/17/19 Time: Location:[facility]

## 2019-02-24 ENCOUNTER — Other Ambulatory Visit: Payer: Self-pay

## 2019-03-02 ENCOUNTER — Encounter: Payer: Self-pay | Admitting: Adult Health

## 2019-03-02 ENCOUNTER — Ambulatory Visit: Payer: BC Managed Care – PPO | Admitting: Adult Health

## 2019-03-02 ENCOUNTER — Other Ambulatory Visit: Payer: Self-pay

## 2019-03-02 VITALS — BP 145/90 | HR 103 | Temp 97.8°F | Resp 16 | Ht 69.0 in | Wt 166.0 lb

## 2019-03-02 DIAGNOSIS — M25552 Pain in left hip: Secondary | ICD-10-CM | POA: Diagnosis not present

## 2019-03-02 DIAGNOSIS — F17209 Nicotine dependence, unspecified, with unspecified nicotine-induced disorders: Secondary | ICD-10-CM | POA: Diagnosis not present

## 2019-03-02 DIAGNOSIS — F411 Generalized anxiety disorder: Secondary | ICD-10-CM

## 2019-03-02 DIAGNOSIS — I10 Essential (primary) hypertension: Secondary | ICD-10-CM

## 2019-03-02 NOTE — Progress Notes (Signed)
[facility] [place], [place] [address]  Internal MEDICINE  Office Visit Note  Patient Name: [name]  [id number]  [id number]  Date of Service: 03/14/2019  Chief Complaint  Patient presents with  . Hypertension  . Follow-up    xray     HPI  Pt is here for follow up to review xray.  He reports about 3-4 weeks of discomfort in his left hip/leg.  The pain is on the distal part of his left thigh.  He localizes the pain,and reports "it feels like my bone hurts"  His hip xray shows minimal degenerative changes, with no other abnormalities.  He has tried muscle relaxer's with no relief. He reports standing feels better, and he is having trouble sitting in exam room. He does reports some tingling to his lower leg, below the painful site.    Current Medication: Outpatient Encounter Medications as of 03/02/2019  Medication Sig  . albuterol (VENTOLIN HFA) 108 (90 Base) MCG/ACT inhaler INHALE 1-2 PUFFS INTO THE LUNGS EVERY 6 (SIX) HOURS AS NEEDED FOR WHEEZING OR SHORTNESS OF BREATH.  . bictegravir-emtricitabine-tenofovir AF (BIKTARVY) 50-200-25 MG TABS tablet Take by mouth.  . cyclobenzaprine (FLEXERIL) 10 MG tablet Take 1 tablet (10 mg total) by mouth 2 (two) times daily as needed for muscle spasms.  . hydrOXYzine (ATARAX/VISTARIL) 25 MG tablet Take 1 tablet (25 mg total) by mouth 2 (two) times daily.  Marland Kitchen lisinopril (ZESTRIL) 10 MG tablet Take 1 tablet (10 mg total) by mouth daily.  . mirtazapine (REMERON) 30 MG tablet Take 1 tablet (30 mg total) by mouth at bedtime.  . TRIUMEQ 600-50-300 MG tablet   . valACYclovir (VALTREX) 1000 MG tablet Take 1 tablet (1,000 mg total) by mouth 2 (two) times daily.  . varenicline (CHANTIX STARTING MONTH PAK) 0.5 MG X 11 & 1 MG X 42 tablet Take one 0.5 mg tablet by mouth once daily for 3 days, then increase to one 0.5 mg tablet twice daily for 4 days, then increase to one 1 mg tablet twice daily.  . [DISCONTINUED] escitalopram  (LEXAPRO) 10 MG tablet Take 1 tab po daily with supper   No facility-administered encounter medications on file as of 03/02/2019.     Surgical History: History reviewed. No pertinent surgical history.  Medical History: Past Medical History:  Diagnosis Date  . Allergy   . COPD (chronic obstructive pulmonary disease) ([place])   . HIV (human immunodeficiency virus infection) ([place])   . Hypertension     Family History: Family History  Adopted: Yes    Social History   Socioeconomic History  . Marital status: Divorced    Spouse name: Not on file  . Number of children: Not on file  . Years of education: Not on file  . Highest education level: Not on file  Occupational History  . Not on file  Social Needs  . Financial resource strain: Not on file  . Food insecurity    Worry: Not on file    Inability: Not on file  . Transportation needs    Medical: Not on file    Non-medical: Not on file  Tobacco Use  . Smoking status: Current Every Day Smoker    Packs/day: 1.00    Types: Cigarettes  . Smokeless tobacco: Never Used  Substance and Sexual Activity  . Alcohol use: Yes  . Drug use: Never  . Sexual activity: Not on file  Lifestyle  . Physical activity    Days per week: Not  on file    Minutes per session: Not on file  . Stress: Not on file  Relationships  . Social Herbalist on phone: Not on file    Gets together: Not on file    Attends religious service: Not on file    Active member of club or organization: Not on file    Attends meetings of clubs or organizations: Not on file    Relationship status: Not on file  . Intimate partner violence    Fear of current or ex partner: Not on file    Emotionally abused: Not on file    Physically abused: Not on file    Forced sexual activity: Not on file  Other Topics Concern  . Not on file  Social History Narrative  . Not on file      Review of Systems  Constitutional: Negative.  Negative for chills, fatigue  and unexpected weight change.  HENT: Negative.  Negative for congestion, rhinorrhea, sneezing and sore throat.   Eyes: Negative for redness.  Respiratory: Negative.  Negative for cough, chest tightness and shortness of breath.   Cardiovascular: Negative.  Negative for chest pain and palpitations.  Gastrointestinal: Negative.  Negative for abdominal pain, constipation, diarrhea, nausea and vomiting.  Endocrine: Negative.   Genitourinary: Negative.  Negative for dysuria and frequency.  Musculoskeletal: Negative.  Negative for arthralgias, back pain, joint swelling and neck pain.       Left thigh, femur pain.  Skin: Negative.  Negative for rash.  Allergic/Immunologic: Negative.   Neurological: Negative.  Negative for tremors and numbness.  Hematological: Negative for adenopathy. Does not bruise/bleed easily.  Psychiatric/Behavioral: Negative.  Negative for behavioral problems, sleep disturbance and suicidal ideas. The patient is not nervous/anxious.     Vital Signs: BP (!) 145/90   Pulse (!) 103   Temp 97.8 F (36.6 C)   Resp 16   Ht 5\' 9"  (1.753 m)   Wt 166 lb (75.3 kg)   SpO2 97%   BMI 24.51 kg/m    Physical Exam Vitals signs and nursing note reviewed.  Constitutional:      General: He is not in acute distress.    Appearance: He is well-developed. He is not diaphoretic.  HENT:     Head: Normocephalic and atraumatic.     Mouth/Throat:     Pharynx: No oropharyngeal exudate.  Eyes:     Pupils: Pupils are equal, round, and reactive to light.  Neck:     Musculoskeletal: Normal range of motion and neck supple.     Thyroid: No thyromegaly.     Vascular: No JVD.     Trachea: No tracheal deviation.  Cardiovascular:     Rate and Rhythm: Normal rate and regular rhythm.     Heart sounds: Normal heart sounds. No murmur. No friction rub. No gallop.   Pulmonary:     Effort: Pulmonary effort is normal. No respiratory distress.     Breath sounds: Normal breath sounds. No wheezing or  rales.  Chest:     Chest wall: No tenderness.  Abdominal:     Palpations: Abdomen is soft.     Tenderness: There is no abdominal tenderness. There is no guarding.  Musculoskeletal: Normal range of motion.  Lymphadenopathy:     Cervical: No cervical adenopathy.  Skin:    General: Skin is warm and dry.  Neurological:     Mental Status: He is alert and oriented to person, place, and time.  Cranial Nerves: No cranial nerve deficit.  Psychiatric:        Behavior: Behavior normal.        Thought Content: Thought content normal.        Judgment: Judgment normal.     Assessment/Plan: 1. Left hip pain Encouraged patient to follow up with ortho, and we discussed bone density evaluation.  - Ambulatory referral to Orthopedic Surgery  2. Hypertension, unspecified type BP elevated today 145/90, He reports drinking coffee this morning, and is anxious about his pain.  Will continue to follow.   3. Generalized anxiety disorder PT is very anxious about his pain, as well as his susceptibility with COVID due to his HIV status.   4. Nicotine dependence with nicotine-induced disorder, unspecified nicotine product type Smoking cessation counseling: 1. Pt acknowledges the risks of long term smoking, she will try to quite smoking. 2. Options for different medications including nicotine products, chewing gum, patch etc, Wellbutrin and Chantix is discussed 3. Goal and date of compete cessation is discussed 4. Total time spent in smoking cessation is 15 min.   General Counseling: [name] understanding of the findings of todays visit and agrees with plan of treatment. I have discussed any further diagnostic evaluation that may be needed or ordered today. We also reviewed his medications today. he has been encouraged to call the office with any questions or concerns that should arise related to todays visit.    Orders Placed This Encounter  Procedures  . Ambulatory referral to Orthopedic  Surgery    No orders of the defined types were placed in this encounter.   Time spent: 25 Minutes   This patient was seen by Orson Gear AGNP-C in Collaboration with Dr Lavera Guise as a part of collaborative care agreement     Kendell Bane AGNP-C Internal medicine

## 2019-03-03 ENCOUNTER — Telehealth: Payer: Self-pay

## 2019-03-03 NOTE — Telephone Encounter (Signed)
Left message advising pt he will need to see ortho before pcp can approve an [place], [employer]

## 2019-03-07 ENCOUNTER — Other Ambulatory Visit: Payer: Self-pay | Admitting: Internal Medicine

## 2019-03-08 ENCOUNTER — Ambulatory Visit: Payer: BC Managed Care – PPO | Admitting: Adult Health

## 2019-03-08 ENCOUNTER — Encounter: Payer: Self-pay | Admitting: Adult Health

## 2019-03-08 ENCOUNTER — Other Ambulatory Visit: Payer: Self-pay

## 2019-03-08 VITALS — BP 148/90 | HR 102 | Temp 97.0°F | Resp 16 | Ht 69.5 in | Wt 168.0 lb

## 2019-03-08 DIAGNOSIS — F1010 Alcohol abuse, uncomplicated: Secondary | ICD-10-CM | POA: Diagnosis not present

## 2019-03-08 DIAGNOSIS — F513 Sleepwalking [somnambulism]: Secondary | ICD-10-CM

## 2019-03-08 DIAGNOSIS — M25552 Pain in left hip: Secondary | ICD-10-CM

## 2019-03-08 DIAGNOSIS — F411 Generalized anxiety disorder: Secondary | ICD-10-CM | POA: Diagnosis not present

## 2019-03-08 DIAGNOSIS — M8588 Other specified disorders of bone density and structure, other site: Secondary | ICD-10-CM | POA: Diagnosis not present

## 2019-03-08 MED ORDER — ESCITALOPRAM OXALATE 20 MG PO TABS: ORAL_TABLET | ORAL | 1 refills | Status: DC

## 2019-03-08 NOTE — Progress Notes (Addendum)
[facility] [place], [place] [address]  Internal MEDICINE  Office Visit Note  Patient Name: [name]  [id number]  [id number]  Date of Service: 03/21/2019  Chief Complaint  Patient presents with  . Medical Management of Chronic Issues    sleep walking and bruise on both hips     HPI Pt is here for a sick visit. Pt reports he woke up over the weekend with blood on his hands. He describes it has a moderate amount of blood.  He noticed it was on the wall by the light switch in his bathroom.  He is concerned that he is sleep walking, which he denies history of.  He has a few small breaks in his skin on his hands that are most likely where the blood has come from.  He reports he has been drinking heavily, and daily recently.  He knows he needs to cut back. He reports 3 strong drinks, nightly.  He has a referral to psych from another provider, and he is going to call them. His Remeron was increased at his previous visit, however he reports at this visit he has stopped taking it completely, as he does not feel it was helping.   His bp is elevated today, and his pulse is 102.  HE is very anxious in the room. He has some ongoing left hip/femur pain that he now has an appt to see ortho for.       Current Medication:  Outpatient Encounter Medications as of 03/08/2019  Medication Sig  . albuterol (VENTOLIN HFA) 108 (90 Base) MCG/ACT inhaler INHALE 1-2 PUFFS INTO THE LUNGS EVERY 6 (SIX) HOURS AS NEEDED FOR WHEEZING OR SHORTNESS OF BREATH.  . bictegravir-emtricitabine-tenofovir AF (BIKTARVY) 50-200-25 MG TABS tablet Take by mouth.  . hydrOXYzine (ATARAX/VISTARIL) 25 MG tablet Take 1 tablet (25 mg total) by mouth 2 (two) times daily. (Patient not taking: Reported on 03/15/2019)  . lisinopril (ZESTRIL) 10 MG tablet Take 1 tablet (10 mg total) by mouth daily.  . mirtazapine (REMERON) 30 MG tablet Take 1 tablet (30 mg total) by mouth at bedtime.  . valACYclovir (VALTREX)  1000 MG tablet Take 1 tablet (1,000 mg total) by mouth 2 (two) times daily.  . varenicline (CHANTIX STARTING MONTH PAK) 0.5 MG X 11 & 1 MG X 42 tablet Take one 0.5 mg tablet by mouth once daily for 3 days, then increase to one 0.5 mg tablet twice daily for 4 days, then increase to one 1 mg tablet twice daily. (Patient not taking: Reported on 03/15/2019)  . [DISCONTINUED] cyclobenzaprine (FLEXERIL) 10 MG tablet Take 1 tablet (10 mg total) by mouth 2 (two) times daily as needed for muscle spasms. (Patient not taking: Reported on 03/15/2019)  . [DISCONTINUED] escitalopram (LEXAPRO) 10 MG tablet Take 1 tab po daily with supper  . [DISCONTINUED] escitalopram (LEXAPRO) 20 MG tablet Take 1 tab po daily with supper  . [DISCONTINUED] TRIUMEQ [id number] MG tablet    No facility-administered encounter medications on file as of 03/08/2019.       Medical History: Past Medical History:  Diagnosis Date  . Allergy   . COPD (chronic obstructive pulmonary disease) ([place])   . 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  . HIV (human immunodeficiency virus infection) ([place])   . Hypertension      Vital Signs: BP (!) 148/90   Pulse (!) 102   Temp (!) 97 F (36.1 C)   Resp 16  Ht 5' 9.5" (1.765 m)   Wt 168 lb (76.2 kg)   SpO2 94%   BMI 24.45 kg/m    Review of Systems  Constitutional: Negative.  Negative for chills, fatigue and unexpected weight change.  HENT: Negative.  Negative for congestion, rhinorrhea, sneezing and sore throat.   Eyes: Negative for redness.  Respiratory: Negative.  Negative for cough, chest tightness and shortness of breath.   Cardiovascular: Negative.  Negative for chest pain and palpitations.  Gastrointestinal: Negative.  Negative for abdominal pain, constipation, diarrhea, nausea and vomiting.  Endocrine: Negative.   Genitourinary: Negative.  Negative for dysuria and frequency.  Musculoskeletal: Negative.  Negative for arthralgias, back pain, joint swelling and neck pain.   Skin: Negative.  Negative for rash.  Allergic/Immunologic: Negative.   Neurological: Negative.  Negative for tremors and numbness.  Hematological: Negative for adenopathy. Does not bruise/bleed easily.  Psychiatric/Behavioral: Negative.  Negative for behavioral problems, sleep disturbance and suicidal ideas. The patient is not nervous/anxious.     Physical Exam Vitals signs and nursing note reviewed.  Constitutional:      General: He is not in acute distress.    Appearance: He is well-developed. He is not diaphoretic.  HENT:     Head: Normocephalic and atraumatic.     Mouth/Throat:     Pharynx: No oropharyngeal exudate.  Eyes:     Pupils: Pupils are equal, round, and reactive to light.  Neck:     Musculoskeletal: Normal range of motion and neck supple.     Thyroid: No thyromegaly.     Vascular: No JVD.     Trachea: No tracheal deviation.  Cardiovascular:     Rate and Rhythm: Normal rate and regular rhythm.     Heart sounds: Normal heart sounds. No murmur. No friction rub. No gallop.   Pulmonary:     Effort: Pulmonary effort is normal. No respiratory distress.     Breath sounds: Normal breath sounds. No wheezing or rales.  Chest:     Chest wall: No tenderness.  Abdominal:     Palpations: Abdomen is soft.     Tenderness: There is no abdominal tenderness. There is no guarding.  Musculoskeletal: Normal range of motion.     Comments: Left leg/hip pain  Lymphadenopathy:     Cervical: No cervical adenopathy.  Skin:    General: Skin is warm and dry.  Neurological:     Mental Status: He is alert and oriented to person, place, and time.     Cranial Nerves: No cranial nerve deficit.  Psychiatric:        Behavior: Behavior normal.        Thought Content: Thought content normal.        Judgment: Judgment normal.     Assessment/Plan: 1. Generalized anxiety disorder Increased patient lexapro to 20mg  po daily.  Encouraged him to follow up with psych.    2. Left hip  pain Ongoing, see ortho as scheduled.   3. Osteopenia of spine Will schedule bone density. - DG Bone Density; Future  4. Alcohol abuse Encouarged patient to cut back on alcohol intake, and see psych as discussed.   5. Sleep walking Likely due to alcohol at bedtime, continue to monitor.   General Counseling: Marylou Mccoy understanding of the findings of todays visit and agrees with plan of treatment. I have discussed any further diagnostic evaluation that may be needed or ordered today. We also reviewed his medications today. he has been encouraged to call the office with  any questions or concerns that should arise related to todays visit.   Orders Placed This Encounter  Procedures  . DG Bone Density    Meds ordered this encounter  Medications  . DISCONTD: escitalopram (LEXAPRO) 20 MG tablet    Sig: Take 1 tab po daily with supper    Dispense:  30 tablet    Refill:  1    Time spent: 25 Minutes  This patient was seen by Orson Gear AGNP-C in Collaboration with Dr Lavera Guise as a part of collaborative care agreement.  Kendell Bane AGNP-C Internal Medicine

## 2019-03-10 ENCOUNTER — Other Ambulatory Visit: Payer: Self-pay

## 2019-03-10 DIAGNOSIS — F411 Generalized anxiety disorder: Secondary | ICD-10-CM

## 2019-03-10 MED ORDER — ESCITALOPRAM OXALATE 20 MG PO TABS: ORAL_TABLET | ORAL | 0 refills | Status: DC

## 2019-03-11 ENCOUNTER — Telehealth: Payer: Self-pay

## 2019-03-11 DIAGNOSIS — M179 Osteoarthritis of knee, unspecified: Secondary | ICD-10-CM | POA: Insufficient documentation

## 2019-03-11 DIAGNOSIS — M170 Bilateral primary osteoarthritis of knee: Secondary | ICD-10-CM | POA: Diagnosis not present

## 2019-03-11 DIAGNOSIS — M7062 Trochanteric bursitis, left hip: Secondary | ICD-10-CM | POA: Diagnosis not present

## 2019-03-11 NOTE — Telephone Encounter (Signed)
Patient cancelled appointment, had cortisone injection pain went away patient does not feel likes needs follow up. klh

## 2019-03-15 ENCOUNTER — Encounter: Payer: Self-pay | Admitting: *Deleted

## 2019-03-15 ENCOUNTER — Telehealth: Payer: Self-pay

## 2019-03-15 ENCOUNTER — Other Ambulatory Visit: Payer: Self-pay

## 2019-03-15 NOTE — Telephone Encounter (Signed)
LVm for pt to call office in regards to his colonoscopy.  He did not receive his instructions-I requested an email address to email instructions to him or he may pick up in the office.  I also requested him to call me back to provide a pharmacy to send his rx to. Also in message I informed him that he will need someone to accompany him to his colonoscopy due to anesthesia required for the procedure, and if he does not have someone to accompany him the colonoscopy can not be done.  Advised of COVID test to be done on Wednesday at [facility] located on [address] between 10:30am and 12:30pm.  Will await call back.  Thanks [employer]

## 2019-03-17 ENCOUNTER — Other Ambulatory Visit
Admission: RE | Admit: 2019-03-17 | Discharge: 2019-03-17 | Disposition: A | Discharge status: Home / Self Care | Payer: BC Managed Care – PPO | Source: Ambulatory Visit | Attending: Gastroenterology | Admitting: Gastroenterology

## 2019-03-17 ENCOUNTER — Other Ambulatory Visit: Payer: Self-pay

## 2019-03-17 ENCOUNTER — Telehealth: Payer: Self-pay | Admitting: Gastroenterology

## 2019-03-17 DIAGNOSIS — Z01812 Encounter for preprocedural laboratory examination: Secondary | ICD-10-CM | POA: Diagnosis not present

## 2019-03-17 DIAGNOSIS — Z20828 Contact with and (suspected) exposure to other viral communicable diseases: Secondary | ICD-10-CM | POA: Diagnosis not present

## 2019-03-17 LAB — SARS CORONAVIRUS 2 (TAT 6-24 HRS): SARS Coronavirus 2: NEGATIVE

## 2019-03-17 MED ORDER — SUPREP BOWEL PREP KIT 17.5-3.13-1.6 GM/177ML PO SOLN: 1.0000 | ORAL | 0 refills | Status: DC

## 2019-03-17 NOTE — Telephone Encounter (Signed)
Patient called & l/m on v/m stating he has not received any instruction by email or mail for his colonoscopy on Monday 03-22-19.     I called him back around 1:30 asking him to call in because we do not have his email address to send them to him & that we will be closing early for the holiday.

## 2019-03-17 NOTE — Discharge Instructions (Signed)

## 2019-03-19 ENCOUNTER — Other Ambulatory Visit: Payer: BC Managed Care – PPO

## 2019-03-20 ENCOUNTER — Other Ambulatory Visit: Payer: Self-pay | Admitting: Internal Medicine

## 2019-03-22 ENCOUNTER — Ambulatory Visit: Payer: BC Managed Care – PPO | Admitting: Anesthesiology

## 2019-03-22 ENCOUNTER — Encounter
Admission: RE | Disposition: A | Discharge status: Home / Self Care | Payer: Self-pay | Source: Home / Self Care | Attending: Gastroenterology

## 2019-03-22 ENCOUNTER — Ambulatory Visit
Admission: RE | Admit: 2019-03-22 | Discharge: 2019-03-22 | Disposition: A | Discharge status: Home / Self Care | Payer: BC Managed Care – PPO | Attending: Gastroenterology | Admitting: Gastroenterology

## 2019-03-22 ENCOUNTER — Other Ambulatory Visit: Payer: Self-pay

## 2019-03-22 DIAGNOSIS — Z1211 Encounter for screening for malignant neoplasm of colon: Secondary | ICD-10-CM

## 2019-03-22 DIAGNOSIS — D123 Benign neoplasm of transverse colon: Secondary | ICD-10-CM | POA: Insufficient documentation

## 2019-03-22 DIAGNOSIS — I10 Essential (primary) hypertension: Secondary | ICD-10-CM | POA: Diagnosis not present

## 2019-03-22 DIAGNOSIS — J449 Chronic obstructive pulmonary disease, unspecified: Secondary | ICD-10-CM | POA: Insufficient documentation

## 2019-03-22 DIAGNOSIS — K635 Polyp of colon: Secondary | ICD-10-CM | POA: Diagnosis not present

## 2019-03-22 DIAGNOSIS — K641 Second degree hemorrhoids: Secondary | ICD-10-CM | POA: Diagnosis not present

## 2019-03-22 DIAGNOSIS — Z21 Asymptomatic human immunodeficiency virus [HIV] infection status: Secondary | ICD-10-CM | POA: Diagnosis not present

## 2019-03-22 DIAGNOSIS — Z79899 Other long term (current) drug therapy: Secondary | ICD-10-CM | POA: Insufficient documentation

## 2019-03-22 DIAGNOSIS — F17210 Nicotine dependence, cigarettes, uncomplicated: Secondary | ICD-10-CM | POA: Diagnosis not present

## 2019-03-22 HISTORY — DX: Superficial frostbite of left hand, initial encounter: T33.521A

## 2019-03-22 HISTORY — PX: POLYPECTOMY: SHX5525

## 2019-03-22 HISTORY — PX: COLONOSCOPY WITH PROPOFOL: SHX5780

## 2019-03-22 SURGERY — COLONOSCOPY WITH PROPOFOL
Anesthesia: General | Site: Rectum

## 2019-03-22 MED ORDER — PROPOFOL 10 MG/ML IV BOLUS
INTRAVENOUS | Status: DC | PRN
  Administered 2019-03-22: 200 mg via INTRAVENOUS
  Administered 2019-03-22 (×4): 60 mg via INTRAVENOUS
  Administered 2019-03-22: 40 mg via INTRAVENOUS
  Administered 2019-03-22 (×3): 60 mg via INTRAVENOUS

## 2019-03-22 MED ORDER — LACTATED RINGERS IV SOLN
10.0000 mL/h | INTRAVENOUS | Status: DC
  Administered 2019-03-22: 10 mL/h via INTRAVENOUS

## 2019-03-22 MED ORDER — LIDOCAINE HCL (CARDIAC) PF 100 MG/5ML IV SOSY
PREFILLED_SYRINGE | INTRAVENOUS | Status: DC | PRN
  Administered 2019-03-22: 30 mg via INTRAVENOUS

## 2019-03-22 MED ORDER — ACETAMINOPHEN 160 MG/5ML PO SOLN: 325.0000 mg | Freq: Once | ORAL | Status: DC

## 2019-03-22 MED ORDER — STERILE WATER FOR IRRIGATION IR SOLN
Status: DC | PRN
  Administered 2019-03-22: 50 mL

## 2019-03-22 MED ORDER — ACETAMINOPHEN 325 MG PO TABS: 325.0000 mg | ORAL_TABLET | Freq: Once | ORAL | Status: DC

## 2019-03-22 SURGICAL SUPPLY — 9 items
CANISTER SUCT 1200ML W/VALVE (MISCELLANEOUS) ×2 IMPLANT
ELECT REM PT RETURN 9FT ADLT (ELECTROSURGICAL) ×2
ELECTRODE REM PT RTRN 9FT ADLT (ELECTROSURGICAL) ×1 IMPLANT
GOWN CVR UNV OPN BCK APRN NK (MISCELLANEOUS) ×2 IMPLANT
GOWN ISOL THUMB LOOP REG UNIV (MISCELLANEOUS) ×2
KIT ENDO PROCEDURE OLY (KITS) ×2 IMPLANT
SNARE SHORT THROW 13M SML OVAL (MISCELLANEOUS) ×2 IMPLANT
TRAP ETRAP POLY (MISCELLANEOUS) ×2 IMPLANT
WATER STERILE IRR 250ML POUR (IV SOLUTION) ×2 IMPLANT

## 2019-03-22 NOTE — Transfer of Care (Signed)
Immediate Anesthesia Transfer of Care Note  Patient: [name]  Procedure(s) Performed: COLONOSCOPY WITH BIOPSIES (N/A Rectum) POLYPECTOMY (N/A Rectum)  Patient Location: PACU  Anesthesia Type: General  Level of Consciousness: awake, alert  and patient cooperative  Airway and Oxygen Therapy: Patient Spontanous Breathing and Patient connected to supplemental oxygen  Post-op Assessment: Post-op Vital signs reviewed, Patient's Cardiovascular Status Stable, Respiratory Function Stable, Patent Airway and No signs of Nausea or vomiting  Post-op Vital Signs: Reviewed and stable  Complications: No apparent anesthesia complications

## 2019-03-22 NOTE — H&P (Signed)
Justin Lame, MD [address] [place], [place] [address] Phone: [phone] Fax : [phone]  Primary Care Physician:  Kendell Bane, NP Primary Gastroenterologist:  Dr. Allen Norris  Pre-Procedure History & Physical: HPI:  [name] is a 53 y.o. male is here for a screening colonoscopy.   Past Medical History:  Diagnosis Date  . Allergy   . COPD (chronic obstructive pulmonary disease) ([place])   . 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  . HIV (human immunodeficiency virus infection) ([place])   . Hypertension     History reviewed. No pertinent surgical history.  Prior to Admission medications   Medication Sig Start Date End Date Taking? Authorizing Provider  albuterol (VENTOLIN HFA) 108 (90 Base) MCG/ACT inhaler INHALE 1-2 PUFFS INTO THE LUNGS EVERY 6 (SIX) HOURS AS NEEDED FOR WHEEZING OR SHORTNESS OF BREATH. 12/03/18  Yes Scarboro, Audie Clear, NP  bictegravir-emtricitabine-tenofovir AF (BIKTARVY) 50-200-25 MG TABS tablet Take by mouth. 03/24/18  Yes [provider]  lisinopril (ZESTRIL) 10 MG tablet Take 1 tablet (10 mg total) by mouth daily. 10/14/18  Yes Scarboro, Audie Clear, NP  mirtazapine (REMERON) 30 MG tablet Take 1 tablet (30 mg total) by mouth at bedtime. 02/12/19  Yes Lavera Guise, MD  valACYclovir (VALTREX) 1000 MG tablet Take 1 tablet (1,000 mg total) by mouth 2 (two) times daily. 10/14/18  Yes Scarboro, Audie Clear, NP  cyclobenzaprine (FLEXERIL) 10 MG tablet TAKE 1 TABLET BY MOUTH TWICE A DAY AS NEEDED FOR MUSCLE SPASMS 03/21/19   Lavera Guise, MD  escitalopram (LEXAPRO) 20 MG tablet Take 1 tab po daily with supper Patient not taking: Reported on 03/15/2019 03/10/19   Kendell Bane, NP  hydrOXYzine (ATARAX/VISTARIL) 25 MG tablet Take 1 tablet (25 mg total) by mouth 2 (two) times daily. Patient not taking: Reported on 03/15/2019 12/04/18   Kendell Bane, NP  Na Sulfate-K Sulfate-Mg Sulf (SUPREP BOWEL PREP KIT) 17.5-3.13-1.6 GM/177ML SOLN Take 1 kit by mouth  as directed. 03/17/19   Justin Lame, MD  varenicline (CHANTIX STARTING MONTH PAK) 0.5 MG X 11 & 1 MG X 42 tablet Take one 0.5 mg tablet by mouth once daily for 3 days, then increase to one 0.5 mg tablet twice daily for 4 days, then increase to one 1 mg tablet twice daily. Patient not taking: Reported on 03/15/2019 10/14/18   Kendell Bane, NP    Allergies as of 02/23/2019  . (No Known Allergies)    Family History  Adopted: Yes    Social History   Socioeconomic History  . Marital status: Divorced    Spouse name: Not on file  . Number of children: Not on file  . Years of education: Not on file  . Highest education level: Not on file  Occupational History  . Not on file  Social Needs  . Financial resource strain: Not on file  . Food insecurity    Worry: Not on file    Inability: Not on file  . Transportation needs    Medical: Not on file    Non-medical: Not on file  Tobacco Use  . Smoking status: Current Every Day Smoker    Packs/day: 1.50    Years: 30.00    Pack years: 45.00    Types: Cigarettes  . Smokeless tobacco: Never Used  . Tobacco comment: since age 33  Substance and Sexual Activity  . Alcohol use: Yes    Alcohol/week: 21.0 standard drinks  Types: 21 Shots of liquor per week  . Drug use: Never  . Sexual activity: Not on file  Lifestyle  . Physical activity    Days per week: Not on file    Minutes per session: Not on file  . Stress: Not on file  Relationships  . Social Herbalist on phone: Not on file    Gets together: Not on file    Attends religious service: Not on file    Active member of club or organization: Not on file    Attends meetings of clubs or organizations: Not on file    Relationship status: Not on file  . Intimate partner violence    Fear of current or ex partner: Not on file    Emotionally abused: Not on file    Physically abused: Not on file    Forced sexual activity: Not on file  Other Topics Concern  . Not on file   Social History Narrative  . Not on file    Review of Systems: See HPI, otherwise negative ROS  Physical Exam: BP (!) 164/95   Pulse 83   Temp 97.7 F (36.5 C)   Resp 16   Ht '5\' 9"'  (1.753 m)   Wt 76.3 kg   SpO2 97%   BMI 24.84 kg/m  General:   Alert,  pleasant and cooperative in NAD Head:  Normocephalic and atraumatic. Neck:  Supple; no masses or thyromegaly. Lungs:  Clear throughout to auscultation.    Heart:  Regular rate and rhythm. Abdomen:  Soft, nontender and nondistended. Normal bowel sounds, without guarding, and without rebound.   Neurologic:  Alert and  oriented x4;  grossly normal neurologically.  Impression/Plan: [name] is now here to undergo a screening colonoscopy.  Risks, benefits, and alternatives regarding colonoscopy have been reviewed with the patient.  Questions have been answered.  All parties agreeable.

## 2019-03-22 NOTE — Anesthesia Procedure Notes (Signed)
Date/Time: 03/22/2019 10:06 AM Performed by: Cameron Ali, CRNA Pre-anesthesia Checklist: Patient identified, Emergency Drugs available, Suction available, Timeout performed and Patient being monitored Patient Re-evaluated:Patient Re-evaluated prior to induction Oxygen Delivery Method: Nasal cannula Placement Confirmation: positive ETCO2

## 2019-03-22 NOTE — Anesthesia Preprocedure Evaluation (Signed)
Anesthesia Evaluation  Patient identified by MRN, date of birth, ID band Patient awake    Reviewed: Allergy & Precautions, H&P , NPO status , Patient's Chart, lab work & pertinent test results  Airway Mallampati: II  TM Distance: >3 FB Neck ROM: full    Dental no notable dental hx.    Pulmonary COPD, Current SmokerPatient did not abstain from smoking.,    Pulmonary exam normal breath sounds clear to auscultation       Cardiovascular hypertension, Normal cardiovascular exam Rhythm:regular Rate:Normal     Neuro/Psych    GI/Hepatic   Endo/Other    Renal/GU      Musculoskeletal   Abdominal   Peds  Hematology  (+) HIV,   Anesthesia Other Findings   Reproductive/Obstetrics                             Anesthesia Physical Anesthesia Plan  ASA: III  Anesthesia Plan: General   Post-op Pain Management:    Induction: Intravenous  PONV Risk Score and Plan: 2 and Propofol infusion, Treatment may vary due to age or medical condition and TIVA  Airway Management Planned: Natural Airway  Additional Equipment:   Intra-op Plan:   Post-operative Plan:   Informed Consent: I have reviewed the patients History and Physical, chart, labs and discussed the procedure including the risks, benefits and alternatives for the proposed anesthesia with the patient or authorized representative who has indicated his/her understanding and acceptance.       Plan Discussed with: CRNA  Anesthesia Plan Comments:         Anesthesia Quick Evaluation

## 2019-03-22 NOTE — Op Note (Addendum)
[facility] Patient Name: [name] Procedure Date: 03/22/2019 9:47 AM MRN: [id number] Account #: [id number] Date of Birth: April 03, 1966 Admit Type: Outpatient Age: 53 Room: [facility] OR ROOM 01 Gender: Male Note Status: Supervisor Override Procedure:             Colonoscopy Indications:           Screening for colorectal malignant neoplasm Providers:             Lucilla Lame MD, MD Referring MD:          Lavera Guise, MD (Referring MD) Medicines:             Propofol per Anesthesia Complications:         No immediate complications. Procedure:             Pre-Anesthesia Assessment:                        - Prior to the procedure, a History and Physical was                         performed, and patient medications and allergies were                         reviewed. The patient's tolerance of previous                         anesthesia was also reviewed. The risks and benefits                         of the procedure and the sedation options and risks                         were discussed with the patient. All questions were                         answered, and informed consent was obtained. Prior                         Anticoagulants: The patient has taken no previous                         anticoagulant or antiplatelet agents. ASA Grade                         Assessment: II - A patient with mild systemic disease.                         After reviewing the risks and benefits, the patient                         was deemed in satisfactory condition to undergo the                         procedure.                        After obtaining informed consent, the colonoscope was  passed under direct vision. Throughout the procedure,                         the patient's blood pressure, pulse, and oxygen                         saturations were monitored continuously. The was                         introduced through the anus and  advanced to the the                         cecum, identified by appendiceal orifice and ileocecal                         valve. The colonoscopy was performed without                         difficulty. The patient tolerated the procedure well.                         The quality of the bowel preparation was excellent. Findings:      The perianal and digital rectal examinations were normal.      Four sessile polyps were found in the transverse colon. The polyps were       5 to 8 mm in size. These polyps were removed with a hot snare. Resection       and retrieval were complete.      A 3 mm polyp was found in the sigmoid colon. The polyp was sessile. The       polyp was removed with a cold snare. Resection was complete, but the       polyp tissue was not retrieved.      Non-bleeding internal hemorrhoids were found during retroflexion. The       hemorrhoids were Grade II (internal hemorrhoids that prolapse but reduce       spontaneously). Impression:            - Four 5 to 8 mm polyps in the transverse colon,                         removed with a hot snare. Resected and retrieved.                        - One 3 mm polyp in the sigmoid colon, removed with a                         cold snare. Complete resection. Polyp tissue not                         retrieved.                        - Non-bleeding internal hemorrhoids. Recommendation:        - Discharge patient to home.                        - Resume previous diet.                        -  Continue present medications.                        - Await pathology results.                        - Repeat colonoscopy in 5 years if polyp adenoma and                         10 years if hyperplastic Procedure Code(s):     --- Professional ---                        [phone], Colonoscopy, flexible; with removal of                         tumor(s), polyp(s), or other lesion(s) by snare                         technique Diagnosis Code(s):     ---  Professional ---                        Z12.11, Encounter for screening for malignant neoplasm                         of colon                        K63.5, Polyp of colon CPT copyright 2019 American Medical Association. All rights reserved. The codes documented in this report are preliminary and upon coder review may  be revised to meet current compliance requirements. Lucilla Lame MD, MD 03/22/2019 10:15:20 AM This report has been signed electronically. Number of Addenda: 0 Note Initiated On: 03/22/2019 9:47 AM Scope Withdrawal Time: 0 hours 6 minutes 14 seconds  Total Procedure Duration: 0 hours 11 minutes 30 seconds  Estimated Blood Loss:  Estimated blood loss: none.      [facility]

## 2019-03-22 NOTE — Anesthesia Postprocedure Evaluation (Signed)
Anesthesia Post Note  Patient: [name]  Procedure(s) Performed: COLONOSCOPY WITH BIOPSIES (N/A Rectum) POLYPECTOMY (N/A Rectum)     Patient location during evaluation: PACU Anesthesia Type: General Level of consciousness: awake and alert and oriented Pain management: satisfactory to patient Vital Signs Assessment: post-procedure vital signs reviewed and stable Respiratory status: spontaneous breathing, nonlabored ventilation and respiratory function stable Cardiovascular status: blood pressure returned to baseline and stable Postop Assessment: Adequate PO intake and No signs of nausea or vomiting Anesthetic complications: no    Raliegh Ip

## 2019-03-23 ENCOUNTER — Telehealth: Payer: Self-pay | Admitting: Gastroenterology

## 2019-03-23 NOTE — Telephone Encounter (Signed)
Unable to contact patient.  We have no additional procedures for him scheduled.  Thanks [employer]

## 2019-03-23 NOTE — Telephone Encounter (Signed)
Pt left vm to cancel his procedure because he already did it yesterday and had his Covid19 test .

## 2019-03-24 ENCOUNTER — Telehealth: Payer: Self-pay

## 2019-03-24 NOTE — Telephone Encounter (Signed)
Called patient with some phone numbers for alcohol abuse help, left message with some phone numbers for him, [phone], [phone], outpatient help centers for alcohol and drug use

## 2019-03-25 ENCOUNTER — Telehealth (HOSPITAL_COMMUNITY): Payer: Self-pay | Admitting: Licensed Clinical Social Worker

## 2019-03-25 ENCOUNTER — Encounter: Payer: Self-pay | Admitting: Gastroenterology

## 2019-03-25 DIAGNOSIS — R000 Tachycardia, unspecified: Secondary | ICD-10-CM | POA: Diagnosis not present

## 2019-03-25 DIAGNOSIS — R4182 Altered mental status, unspecified: Secondary | ICD-10-CM | POA: Diagnosis not present

## 2019-03-25 DIAGNOSIS — D849 Immunodeficiency, unspecified: Secondary | ICD-10-CM | POA: Diagnosis not present

## 2019-03-25 DIAGNOSIS — J322 Chronic ethmoidal sinusitis: Secondary | ICD-10-CM | POA: Diagnosis not present

## 2019-03-25 NOTE — Telephone Encounter (Signed)
Clinician spoke with client about referral for substance abuse treatment. Client reports he is unable to copmlete [facility] CDIOP program due to working second shift. Client states he has pending appointment with [facility] for following day to discuss CDIOP program hours. Based on currently reported symptoms daily of shaking, nausea, headaches, chills and cold sweats, having to drink to attend work client is recommended to go to ER for medically monitored detox. Client is in agreement with this plan.

## 2019-03-26 ENCOUNTER — Encounter: Payer: Self-pay | Admitting: Gastroenterology

## 2019-03-30 ENCOUNTER — Other Ambulatory Visit: Payer: Self-pay

## 2019-03-30 ENCOUNTER — Ambulatory Visit: Payer: BC Managed Care – PPO | Admitting: Adult Health

## 2019-03-30 ENCOUNTER — Telehealth: Payer: Self-pay

## 2019-03-30 NOTE — Telephone Encounter (Signed)
Records release request filled put by patient and last CPE printed at handed to patient on 03-30-19.

## 2019-03-31 DIAGNOSIS — F419 Anxiety disorder, unspecified: Secondary | ICD-10-CM | POA: Diagnosis not present

## 2019-04-02 DIAGNOSIS — B20 Human immunodeficiency virus [HIV] disease: Secondary | ICD-10-CM | POA: Diagnosis not present

## 2019-04-06 ENCOUNTER — Other Ambulatory Visit: Payer: Self-pay | Admitting: Adult Health

## 2019-04-06 ENCOUNTER — Other Ambulatory Visit: Payer: Self-pay | Admitting: Internal Medicine

## 2019-04-06 DIAGNOSIS — F411 Generalized anxiety disorder: Secondary | ICD-10-CM

## 2019-04-13 DIAGNOSIS — Z720 Tobacco use: Secondary | ICD-10-CM | POA: Diagnosis not present

## 2019-04-13 DIAGNOSIS — B20 Human immunodeficiency virus [HIV] disease: Secondary | ICD-10-CM | POA: Diagnosis not present

## 2019-04-13 DIAGNOSIS — Z7289 Other problems related to lifestyle: Secondary | ICD-10-CM | POA: Diagnosis not present

## 2019-04-13 NOTE — Progress Notes (Signed)
This pt should have a follow up in a month or so

## 2019-04-21 DIAGNOSIS — F419 Anxiety disorder, unspecified: Secondary | ICD-10-CM | POA: Diagnosis not present

## 2019-04-22 ENCOUNTER — Telehealth: Payer: Self-pay

## 2019-04-22 NOTE — Telephone Encounter (Signed)
RECORD REQUEST COMPLETED AND FAXED BACK TO [facility].

## 2019-04-28 ENCOUNTER — Telehealth: Payer: Self-pay

## 2019-04-28 NOTE — Telephone Encounter (Signed)
Called lmom informing patient of appointment. klh 

## 2019-04-30 ENCOUNTER — Ambulatory Visit: Payer: BC Managed Care – PPO | Admitting: Adult Health

## 2019-05-11 DIAGNOSIS — M8588 Other specified disorders of bone density and structure, other site: Secondary | ICD-10-CM | POA: Diagnosis not present

## 2019-05-12 ENCOUNTER — Telehealth: Payer: Self-pay

## 2019-05-12 NOTE — Telephone Encounter (Signed)
Called lmom informing patient of virtual visit. klh 

## 2019-05-14 ENCOUNTER — Encounter: Payer: Self-pay | Admitting: Adult Health

## 2019-05-14 ENCOUNTER — Other Ambulatory Visit: Payer: Self-pay

## 2019-05-14 ENCOUNTER — Ambulatory Visit (INDEPENDENT_AMBULATORY_CARE_PROVIDER_SITE_OTHER): Payer: BC Managed Care – PPO | Admitting: Adult Health

## 2019-05-14 VITALS — Resp 16 | Ht 69.5 in | Wt 163.0 lb

## 2019-05-14 DIAGNOSIS — F1010 Alcohol abuse, uncomplicated: Secondary | ICD-10-CM

## 2019-05-14 DIAGNOSIS — F17209 Nicotine dependence, unspecified, with unspecified nicotine-induced disorders: Secondary | ICD-10-CM | POA: Diagnosis not present

## 2019-05-14 DIAGNOSIS — M25552 Pain in left hip: Secondary | ICD-10-CM

## 2019-05-14 DIAGNOSIS — B20 Human immunodeficiency virus [HIV] disease: Secondary | ICD-10-CM

## 2019-05-14 DIAGNOSIS — F411 Generalized anxiety disorder: Secondary | ICD-10-CM

## 2019-05-14 DIAGNOSIS — I10 Essential (primary) hypertension: Secondary | ICD-10-CM

## 2019-05-14 DIAGNOSIS — Z125 Encounter for screening for malignant neoplasm of prostate: Secondary | ICD-10-CM

## 2019-05-14 DIAGNOSIS — J209 Acute bronchitis, unspecified: Secondary | ICD-10-CM

## 2019-05-14 DIAGNOSIS — J440 Chronic obstructive pulmonary disease with acute lower respiratory infection: Secondary | ICD-10-CM

## 2019-05-14 MED ORDER — ALBUTEROL SULFATE HFA 108 (90 BASE) MCG/ACT IN AERS: 1.0000 | INHALATION_SPRAY | Freq: Four times a day (QID) | RESPIRATORY_TRACT | 2 refills | Status: DC | PRN

## 2019-05-14 MED ORDER — LISINOPRIL 10 MG PO TABS: 10.0000 mg | ORAL_TABLET | Freq: Every day | ORAL | 1 refills | Status: DC

## 2019-05-14 MED ORDER — ESCITALOPRAM OXALATE 20 MG PO TABS: ORAL_TABLET | ORAL | 1 refills | Status: DC

## 2019-05-14 NOTE — Progress Notes (Signed)
[facility] [place], [place] [address]  Internal MEDICINE  Telephone Visit  Patient Name: [name]  [id number]  [id number]  Date of Service: 05/14/2019  I connected with the patient at  1117 by telephone and verified the patients identity using two identifiers.   I discussed the limitations, risks, security and privacy concerns of performing an evaluation and management service by telephone and the availability of in person appointments. I also discussed with the patient that there may be a patient responsible charge related to the service.  The patient expressed understanding and agrees to proceed.    Chief Complaint  Patient presents with  . Telephone Assessment  . Telephone Screen  . HIV Positive/AIDS  . Hypertension    HPI  Pt is seen via video.  He reports I seen today for follow up on HTN, alcohol abuse, anxiety. His blood pressure is currently well controlled.  He tried to seek alcohol treatment with [facility] but they did not want to give him the medications he used in the past, so he stopped going.  He has cut down on his drinking, but continues to drink at this time.  He reports excellent results with lexapro for his anxiety.  He is pleased with this medication.  He reports his hip pain has gotten better since his cortisone injection.   Current Medication: Outpatient Encounter Medications as of 05/14/2019  Medication Sig  . albuterol (VENTOLIN HFA) 108 (90 Base) MCG/ACT inhaler Inhale 1-2 puffs into the lungs every 6 (six) hours as needed for wheezing or shortness of breath.  . bictegravir-emtricitabine-tenofovir AF (BIKTARVY) 50-200-25 MG TABS tablet Take by mouth.  . cyclobenzaprine (FLEXERIL) 10 MG tablet TAKE 1 TABLET BY MOUTH TWICE A DAY AS NEEDED FOR MUSCLE SPASMS  . escitalopram (LEXAPRO) 20 MG tablet TAKE 1 TABLET BY MOUTH DAILY WITH SUPPER  . hydrOXYzine (ATARAX/VISTARIL) 25 MG tablet Take 1 tablet (25 mg total) by mouth 2 (two) times daily.  Marland Kitchen  lisinopril (ZESTRIL) 10 MG tablet Take 1 tablet (10 mg total) by mouth daily.  . mirtazapine (REMERON) 30 MG tablet TAKE 1 TABLET BY MOUTH AT BEDTIME.  . Na Sulfate-K Sulfate-Mg Sulf (SUPREP BOWEL PREP KIT) 17.5-3.13-1.6 GM/177ML SOLN Take 1 kit by mouth as directed.  . valACYclovir (VALTREX) 1000 MG tablet Take 1 tablet (1,000 mg total) by mouth 2 (two) times daily.  . varenicline (CHANTIX STARTING MONTH PAK) 0.5 MG X 11 & 1 MG X 42 tablet Take one 0.5 mg tablet by mouth once daily for 3 days, then increase to one 0.5 mg tablet twice daily for 4 days, then increase to one 1 mg tablet twice daily.  . [DISCONTINUED] albuterol (VENTOLIN HFA) 108 (90 Base) MCG/ACT inhaler INHALE 1-2 PUFFS INTO THE LUNGS EVERY 6 (SIX) HOURS AS NEEDED FOR WHEEZING OR SHORTNESS OF BREATH.  . [DISCONTINUED] escitalopram (LEXAPRO) 20 MG tablet Take 1 tab po daily with supper  . [DISCONTINUED] escitalopram (LEXAPRO) 20 MG tablet TAKE 1 TABLET BY MOUTH DAILY WITH SUPPER  . [DISCONTINUED] lisinopril (ZESTRIL) 10 MG tablet Take 1 tablet (10 mg total) by mouth daily.   No facility-administered encounter medications on file as of 05/14/2019.    Surgical History: Past Surgical History:  Procedure Laterality Date  . COLONOSCOPY WITH PROPOFOL N/A 03/22/2019   Procedure: COLONOSCOPY WITH BIOPSIES;  Surgeon: Lucilla Lame, MD;  Location: [place];  Service: Endoscopy;  Laterality: N/A;  . POLYPECTOMY N/A 03/22/2019   Procedure: POLYPECTOMY;  Surgeon: Lucilla Lame,  MD;  Location: [place];  Service: Endoscopy;  Laterality: N/A;    Medical History: Past Medical History:  Diagnosis Date  . Allergy   . COPD (chronic obstructive pulmonary disease) ([place])   . 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  . HIV (human immunodeficiency virus infection) ([place])   . Hypertension     Family History: Family History  Adopted: Yes    Social History   Socioeconomic History  . Marital status: Divorced    Spouse  name: Not on file  . Number of children: Not on file  . Years of education: Not on file  . Highest education level: Not on file  Occupational History  . Not on file  Tobacco Use  . Smoking status: Current Every Day Smoker    Packs/day: 1.50    Years: 30.00    Pack years: 45.00    Types: Cigarettes  . Smokeless tobacco: Never Used  . Tobacco comment: since age 47  Substance and Sexual Activity  . Alcohol use: Yes    Alcohol/week: 21.0 standard drinks    Types: 21 Shots of liquor per week  . Drug use: Never  . Sexual activity: Not on file  Other Topics Concern  . Not on file  Social History Narrative  . Not on file   Social Determinants of Health   Financial Resource Strain:   . Difficulty of Paying Living Expenses: Not on file  Food Insecurity:   . Worried About Charity fundraiser in the Last Year: Not on file  . Ran Out of Food in the Last Year: Not on file  Transportation Needs:   . Lack of Transportation (Medical): Not on file  . Lack of Transportation (Non-Medical): Not on file  Physical Activity:   . Days of Exercise per Week: Not on file  . Minutes of Exercise per Session: Not on file  Stress:   . Feeling of Stress : Not on file  Social Connections:   . Frequency of Communication with Friends and Family: Not on file  . Frequency of Social Gatherings with Friends and Family: Not on file  . Attends Religious Services: Not on file  . Active Member of Clubs or Organizations: Not on file  . Attends Archivist Meetings: Not on file  . Marital Status: Not on file  Intimate Partner Violence:   . Fear of Current or Ex-Partner: Not on file  . Emotionally Abused: Not on file  . Physically Abused: Not on file  . Sexually Abused: Not on file      Review of Systems  Constitutional: Negative.  Negative for chills, fatigue and unexpected weight change.  HENT: Negative.  Negative for congestion, rhinorrhea, sneezing and sore throat.   Eyes: Negative for  redness.  Respiratory: Negative.  Negative for cough, chest tightness and shortness of breath.   Cardiovascular: Negative.  Negative for chest pain and palpitations.  Gastrointestinal: Negative.  Negative for abdominal pain, constipation, diarrhea, nausea and vomiting.  Endocrine: Negative.   Genitourinary: Negative.  Negative for dysuria and frequency.  Musculoskeletal: Negative.  Negative for arthralgias, back pain, joint swelling and neck pain.  Skin: Negative.  Negative for rash.  Allergic/Immunologic: Negative.   Neurological: Negative.  Negative for tremors and numbness.  Hematological: Negative for adenopathy. Does not bruise/bleed easily.  Psychiatric/Behavioral: Negative.  Negative for behavioral problems, sleep disturbance and suicidal ideas. The patient is not nervous/anxious.     Vital Signs: Resp 16  Ht 5' 9.5" (1.765 m)   Wt 163 lb (73.9 kg)   BMI 23.73 kg/m    Observation/Objective:  Well appearing, NAD noted.    Assessment/Plan: 1. Generalized anxiety disorder Continue to uses lexapro as directed. Pt is having good results.   2. Hypertension, unspecified type Stable, continue present management.  3. Alcohol abuse Continues to drink, was being seen at [facility], but they would not give him medication to help him quit drinking so he stopped going.   4. Nicotine dependence with nicotine-induced disorder, unspecified nicotine product type Smoking cessation counseling: 1. Pt acknowledges the risks of long term smoking, she will try to quite smoking. 2. Options for different medications including nicotine products, chewing gum, patch etc, Wellbutrin and Chantix is discussed 3. Goal and date of compete cessation is discussed 4. Total time spent in smoking cessation is 15 min.  5. Left hip pain Resolved with cortisone injection  6. HIV infection, unspecified symptom status ([place]) Continue to follow up with infectious disease as   General Counseling: Marylou Mccoy  understanding of the findings of today's phone visit and agrees with plan of treatment. I have discussed any further diagnostic evaluation that may be needed or ordered today. We also reviewed his medications today. he has been encouraged to call the office with any questions or concerns that should arise related to todays visit.    Orders Placed This Encounter  Procedures  . CBC with Differential/Platelet  . Lipid Panel With LDL/HDL Ratio  . TSH  . T4, free  . Comprehensive metabolic panel  . PSA    Meds ordered this encounter  Medications  . lisinopril (ZESTRIL) 10 MG tablet    Sig: Take 1 tablet (10 mg total) by mouth daily.    Dispense:  90 tablet    Refill:  1  . albuterol (VENTOLIN HFA) 108 (90 Base) MCG/ACT inhaler    Sig: Inhale 1-2 puffs into the lungs every 6 (six) hours as needed for wheezing or shortness of breath.    Dispense:  18 g    Refill:  2  . escitalopram (LEXAPRO) 20 MG tablet    Sig: TAKE 1 TABLET BY MOUTH DAILY WITH SUPPER    Dispense:  90 tablet    Refill:  1    Time spent: 25 Minutes    Orson Gear AGNP-C Internal medicine

## 2019-05-19 ENCOUNTER — Encounter: Payer: Self-pay | Admitting: Adult Health

## 2019-05-19 ENCOUNTER — Emergency Department: Payer: BC Managed Care – PPO

## 2019-05-19 ENCOUNTER — Inpatient Hospital Stay
Admission: EM | Admit: 2019-05-19 | Discharge: 2019-05-22 | DRG: 982 | Disposition: A | Discharge status: Home / Self Care | Payer: BC Managed Care – PPO | Attending: Family Medicine | Admitting: Family Medicine

## 2019-05-19 ENCOUNTER — Ambulatory Visit: Payer: BC Managed Care – PPO | Admitting: Adult Health

## 2019-05-19 ENCOUNTER — Other Ambulatory Visit: Payer: Self-pay

## 2019-05-19 ENCOUNTER — Encounter: Payer: Self-pay | Admitting: Emergency Medicine

## 2019-05-19 VITALS — BP 139/82 | HR 93 | Temp 97.4°F | Ht 69.0 in | Wt 172.6 lb

## 2019-05-19 DIAGNOSIS — L03818 Cellulitis of other sites: Secondary | ICD-10-CM

## 2019-05-19 DIAGNOSIS — Z20822 Contact with and (suspected) exposure to covid-19: Secondary | ICD-10-CM | POA: Diagnosis present

## 2019-05-19 DIAGNOSIS — L97929 Non-pressure chronic ulcer of unspecified part of left lower leg with unspecified severity: Secondary | ICD-10-CM | POA: Diagnosis not present

## 2019-05-19 DIAGNOSIS — F1010 Alcohol abuse, uncomplicated: Secondary | ICD-10-CM

## 2019-05-19 DIAGNOSIS — F329 Major depressive disorder, single episode, unspecified: Secondary | ICD-10-CM | POA: Diagnosis present

## 2019-05-19 DIAGNOSIS — Z6823 Body mass index (BMI) 23.0-23.9, adult: Secondary | ICD-10-CM

## 2019-05-19 DIAGNOSIS — Z79899 Other long term (current) drug therapy: Secondary | ICD-10-CM

## 2019-05-19 DIAGNOSIS — Z91048 Other nonmedicinal substance allergy status: Secondary | ICD-10-CM | POA: Diagnosis not present

## 2019-05-19 DIAGNOSIS — L089 Local infection of the skin and subcutaneous tissue, unspecified: Secondary | ICD-10-CM | POA: Diagnosis not present

## 2019-05-19 DIAGNOSIS — F10239 Alcohol dependence with withdrawal, unspecified: Secondary | ICD-10-CM | POA: Diagnosis not present

## 2019-05-19 DIAGNOSIS — F419 Anxiety disorder, unspecified: Secondary | ICD-10-CM | POA: Diagnosis present

## 2019-05-19 DIAGNOSIS — F17210 Nicotine dependence, cigarettes, uncomplicated: Secondary | ICD-10-CM | POA: Diagnosis present

## 2019-05-19 DIAGNOSIS — L03116 Cellulitis of left lower limb: Principal | ICD-10-CM

## 2019-05-19 DIAGNOSIS — B20 Human immunodeficiency virus [HIV] disease: Secondary | ICD-10-CM

## 2019-05-19 DIAGNOSIS — Z21 Asymptomatic human immunodeficiency virus [HIV] infection status: Secondary | ICD-10-CM | POA: Diagnosis not present

## 2019-05-19 DIAGNOSIS — Z86010 Personal history of colonic polyps: Secondary | ICD-10-CM

## 2019-05-19 DIAGNOSIS — J449 Chronic obstructive pulmonary disease, unspecified: Secondary | ICD-10-CM | POA: Diagnosis present

## 2019-05-19 DIAGNOSIS — L0390 Cellulitis, unspecified: Secondary | ICD-10-CM | POA: Diagnosis present

## 2019-05-19 DIAGNOSIS — I10 Essential (primary) hypertension: Secondary | ICD-10-CM

## 2019-05-19 DIAGNOSIS — L97925 Non-pressure chronic ulcer of unspecified part of left lower leg with muscle involvement without evidence of necrosis: Secondary | ICD-10-CM | POA: Diagnosis not present

## 2019-05-19 DIAGNOSIS — R609 Edema, unspecified: Secondary | ICD-10-CM

## 2019-05-19 DIAGNOSIS — Z91038 Other insect allergy status: Secondary | ICD-10-CM

## 2019-05-19 DIAGNOSIS — Z03818 Encounter for observation for suspected exposure to other biological agents ruled out: Secondary | ICD-10-CM | POA: Diagnosis not present

## 2019-05-19 DIAGNOSIS — E876 Hypokalemia: Secondary | ICD-10-CM | POA: Diagnosis present

## 2019-05-19 DIAGNOSIS — L97823 Non-pressure chronic ulcer of other part of left lower leg with necrosis of muscle: Secondary | ICD-10-CM | POA: Diagnosis present

## 2019-05-19 DIAGNOSIS — L02416 Cutaneous abscess of left lower limb: Secondary | ICD-10-CM | POA: Diagnosis not present

## 2019-05-19 DIAGNOSIS — M7989 Other specified soft tissue disorders: Secondary | ICD-10-CM | POA: Diagnosis not present

## 2019-05-19 DIAGNOSIS — E440 Moderate protein-calorie malnutrition: Secondary | ICD-10-CM | POA: Diagnosis present

## 2019-05-19 LAB — COMPREHENSIVE METABOLIC PANEL
ALT: 18 U/L (ref 0–44)
ALT: 17 U/L (ref 0–44)
AST: 34 U/L (ref 15–41)
AST: 29 U/L (ref 15–41)
Albumin: 3.2 g/dL — ABNORMAL LOW (ref 3.5–5.0)
Albumin: 3 g/dL — ABNORMAL LOW (ref 3.5–5.0)
Alkaline Phosphatase: 68 U/L (ref 38–126)
Alkaline Phosphatase: 58 U/L (ref 38–126)
Anion gap: 10 (ref 5–15)
Anion gap: 11 (ref 5–15)
BUN: 10 mg/dL (ref 6–20)
BUN: 8 mg/dL (ref 6–20)
CO2: 25 mmol/L (ref 22–32)
CO2: 23 mmol/L (ref 22–32)
Calcium: 8.6 mg/dL — ABNORMAL LOW (ref 8.9–10.3)
Calcium: 8 mg/dL — ABNORMAL LOW (ref 8.9–10.3)
Chloride: 102 mmol/L (ref 98–111)
Chloride: 100 mmol/L (ref 98–111)
Creatinine, Ser: 0.69 mg/dL (ref 0.61–1.24)
Creatinine, Ser: 0.65 mg/dL (ref 0.61–1.24)
GFR calc Af Amer: >60 mL/min (ref >60)
GFR calc Af Amer: >60 mL/min (ref >60)
GFR calc non Af Amer: >60 mL/min (ref >60)
GFR calc non Af Amer: >60 mL/min (ref >60)
Glucose, Bld: 89 mg/dL (ref 70–99)
Glucose, Bld: 147 mg/dL — ABNORMAL HIGH (ref 70–99)
Potassium: 3.1 mmol/L — ABNORMAL LOW (ref 3.5–5.1)
Potassium: 3.2 mmol/L — ABNORMAL LOW (ref 3.5–5.1)
Sodium: 137 mmol/L (ref 135–145)
Sodium: 134 mmol/L — ABNORMAL LOW (ref 135–145)
Total Bilirubin: 0.5 mg/dL (ref 0.3–1.2)
Total Bilirubin: 0.8 mg/dL (ref 0.3–1.2)
Total Protein: 7 g/dL (ref 6.5–8.1)
Total Protein: 6.5 g/dL (ref 6.5–8.1)

## 2019-05-19 LAB — CBC WITH DIFFERENTIAL/PLATELET
Abs Immature Granulocytes: 0.08 10*3/uL — ABNORMAL HIGH (ref 0.00–0.07)
Basophils Absolute: 0.1 10*3/uL (ref 0.0–0.1)
Basophils Relative: 1 %
Eosinophils Absolute: 0.1 10*3/uL (ref 0.0–0.5)
Eosinophils Relative: 1 %
HCT: 36.7 % — ABNORMAL LOW (ref 39.0–52.0)
Hemoglobin: 13.1 g/dL (ref 13.0–17.0)
Immature Granulocytes: 1 %
Lymphocytes Relative: 16 %
Lymphs Abs: 2.2 10*3/uL (ref 0.7–4.0)
MCH: 34.6 pg — ABNORMAL HIGH (ref 26.0–34.0)
MCHC: 35.7 g/dL (ref 30.0–36.0)
MCV: 96.8 fL (ref 80.0–100.0)
Monocytes Absolute: 1.4 10*3/uL — ABNORMAL HIGH (ref 0.1–1.0)
Monocytes Relative: 10 %
Neutro Abs: 10 10*3/uL — ABNORMAL HIGH (ref 1.7–7.7)
Neutrophils Relative %: 71 %
Platelets: 218 10*3/uL (ref 150–400)
RBC: 3.79 MIL/uL — ABNORMAL LOW (ref 4.22–5.81)
RDW: 13.3 % (ref 11.5–15.5)
WBC: 13.9 10*3/uL — ABNORMAL HIGH (ref 4.0–10.5)
nRBC: 0 % (ref 0.0–0.2)

## 2019-05-19 LAB — CBC
HCT: 36 % — ABNORMAL LOW (ref 39.0–52.0)
Hemoglobin: 12.9 g/dL — ABNORMAL LOW (ref 13.0–17.0)
MCH: 34.8 pg — ABNORMAL HIGH (ref 26.0–34.0)
MCHC: 35.8 g/dL (ref 30.0–36.0)
MCV: 97 fL (ref 80.0–100.0)
Platelets: 213 10*3/uL (ref 150–400)
RBC: 3.71 MIL/uL — ABNORMAL LOW (ref 4.22–5.81)
RDW: 13.2 % (ref 11.5–15.5)
WBC: 10.7 10*3/uL — ABNORMAL HIGH (ref 4.0–10.5)
nRBC: 0 % (ref 0.0–0.2)

## 2019-05-19 LAB — SARS CORONAVIRUS 2 (TAT 6-24 HRS): SARS Coronavirus 2: NEGATIVE

## 2019-05-19 LAB — LACTIC ACID, PLASMA
Lactic Acid, Venous: 1.8 mmol/L (ref 0.5–1.9)
Lactic Acid, Venous: 1.6 mmol/L (ref 0.5–1.9)

## 2019-05-19 MED ORDER — SODIUM CHLORIDE 0.9 % IV SOLN
1.0000 g | INTRAVENOUS | Status: DC
  Filled 2019-05-19: qty 10

## 2019-05-19 MED ORDER — SODIUM CHLORIDE 0.9 % IV SOLN
2.0000 g | Freq: Once | INTRAVENOUS | Status: AC
  Administered 2019-05-19: 17:00:00 2 g via INTRAVENOUS
  Filled 2019-05-19: qty 20

## 2019-05-19 MED ORDER — ENOXAPARIN SODIUM 40 MG/0.4ML ~~LOC~~ SOLN
40.0000 mg | SUBCUTANEOUS | Status: DC
  Administered 2019-05-19 – 2019-05-21 (×3): 40 mg via SUBCUTANEOUS
  Filled 2019-05-19 (×3): qty 0.4

## 2019-05-19 MED ORDER — FLUTICASONE PROPIONATE 50 MCG/ACT NA SUSP
1.0000 | Freq: Every day | NASAL | Status: DC
  Administered 2019-05-21 – 2019-05-22 (×2): 1 via NASAL
  Filled 2019-05-19 (×2): qty 16

## 2019-05-19 MED ORDER — TRAMADOL HCL 50 MG PO TABS
50.0000 mg | ORAL_TABLET | Freq: Four times a day (QID) | ORAL | Status: AC | PRN
  Administered 2019-05-19 – 2019-05-21 (×3): 50 mg via ORAL
  Filled 2019-05-19 (×4): qty 1

## 2019-05-19 MED ORDER — LISINOPRIL 10 MG PO TABS
10.0000 mg | ORAL_TABLET | Freq: Every day | ORAL | Status: DC
  Administered 2019-05-21 – 2019-05-22 (×2): 10 mg via ORAL
  Filled 2019-05-19 (×3): qty 1

## 2019-05-19 MED ORDER — LORATADINE 10 MG PO TABS
10.0000 mg | ORAL_TABLET | Freq: Every day | ORAL | Status: DC
  Administered 2019-05-21 – 2019-05-22 (×2): 10 mg via ORAL
  Filled 2019-05-19 (×3): qty 1

## 2019-05-19 MED ORDER — VANCOMYCIN HCL IN DEXTROSE 1-5 GM/200ML-% IV SOLN
1000.0000 mg | Freq: Once | INTRAVENOUS | Status: AC
  Administered 2019-05-19: 18:00:00 1000 mg via INTRAVENOUS
  Filled 2019-05-19: qty 200

## 2019-05-19 MED ORDER — LORAZEPAM 2 MG/ML IJ SOLN
1.0000 mg | INTRAMUSCULAR | Status: DC | PRN
  Administered 2019-05-19: 22:00:00 1 mg via INTRAVENOUS
  Filled 2019-05-19: qty 1

## 2019-05-19 MED ORDER — MIRTAZAPINE 15 MG PO TABS
30.0000 mg | ORAL_TABLET | Freq: Every day | ORAL | Status: DC
  Administered 2019-05-19 – 2019-05-21 (×3): 30 mg via ORAL
  Filled 2019-05-19 (×3): qty 2

## 2019-05-19 MED ORDER — ADULT MULTIVITAMIN W/MINERALS CH
1.0000 | ORAL_TABLET | Freq: Every day | ORAL | Status: DC
  Administered 2019-05-19 – 2019-05-22 (×3): 1 via ORAL
  Filled 2019-05-19 (×5): qty 1

## 2019-05-19 MED ORDER — ACETAMINOPHEN 650 MG RE SUPP: 650.0000 mg | Freq: Four times a day (QID) | RECTAL | Status: DC | PRN

## 2019-05-19 MED ORDER — MORPHINE SULFATE (PF) 4 MG/ML IV SOLN
4.0000 mg | Freq: Once | INTRAVENOUS | Status: AC
  Administered 2019-05-19: 19:00:00 4 mg via INTRAVENOUS
  Filled 2019-05-19: qty 1

## 2019-05-19 MED ORDER — BICTEGRAVIR-EMTRICITAB-TENOFOV 50-200-25 MG PO TABS
1.0000 | ORAL_TABLET | Freq: Every day | ORAL | Status: DC
  Administered 2019-05-21 – 2019-05-22 (×2): 1 via ORAL
  Filled 2019-05-19 (×3): qty 1

## 2019-05-19 MED ORDER — VANCOMYCIN HCL 1250 MG/250ML IV SOLN
1250.0000 mg | Freq: Two times a day (BID) | INTRAVENOUS | Status: DC
  Filled 2019-05-19: qty 250

## 2019-05-19 MED ORDER — POTASSIUM CHLORIDE CRYS ER 20 MEQ PO TBCR
20.0000 meq | EXTENDED_RELEASE_TABLET | Freq: Once | ORAL | Status: AC
  Administered 2019-05-19: 22:00:00 20 meq via ORAL
  Filled 2019-05-19: qty 1

## 2019-05-19 MED ORDER — LORAZEPAM 1 MG PO TABS
1.0000 mg | ORAL_TABLET | ORAL | Status: DC | PRN
  Administered 2019-05-20 (×2): 1 mg via ORAL
  Filled 2019-05-19 (×2): qty 1

## 2019-05-19 MED ORDER — ACETAMINOPHEN 325 MG PO TABS: 650.0000 mg | ORAL_TABLET | Freq: Four times a day (QID) | ORAL | Status: DC | PRN

## 2019-05-19 MED ORDER — POTASSIUM CHLORIDE IN NACL 20-0.9 MEQ/L-% IV SOLN
INTRAVENOUS | Status: DC
  Filled 2019-05-19 (×2): qty 1000

## 2019-05-19 MED ORDER — ALBUTEROL SULFATE (2.5 MG/3ML) 0.083% IN NEBU: 2.5000 mg | INHALATION_SOLUTION | Freq: Four times a day (QID) | RESPIRATORY_TRACT | Status: DC | PRN

## 2019-05-19 MED ORDER — PRO-STAT SUGAR FREE PO LIQD
30.0000 mL | Freq: Two times a day (BID) | ORAL | Status: DC
  Administered 2019-05-20 – 2019-05-22 (×2): 30 mL via ORAL

## 2019-05-19 MED ORDER — SODIUM CHLORIDE 0.9 % IV BOLUS
1000.0000 mL | Freq: Once | INTRAVENOUS | Status: AC
  Administered 2019-05-19: 17:00:00 1000 mL via INTRAVENOUS

## 2019-05-19 MED ORDER — VANCOMYCIN HCL 750 MG/150ML IV SOLN
750.0000 mg | Freq: Once | INTRAVENOUS | Status: AC
  Administered 2019-05-19: 21:00:00 750 mg via INTRAVENOUS
  Filled 2019-05-19: qty 150

## 2019-05-19 MED ORDER — THIAMINE HCL 100 MG/ML IJ SOLN
100.0000 mg | Freq: Every day | INTRAMUSCULAR | Status: DC
  Administered 2019-05-20: 10:00:00 100 mg via INTRAVENOUS
  Filled 2019-05-19: qty 2

## 2019-05-19 MED ORDER — ESCITALOPRAM OXALATE 10 MG PO TABS
20.0000 mg | ORAL_TABLET | Freq: Every day | ORAL | Status: DC
  Administered 2019-05-20 – 2019-05-21 (×2): 20 mg via ORAL
  Filled 2019-05-19 (×4): qty 2

## 2019-05-19 MED ORDER — ONDANSETRON HCL 4 MG/2ML IJ SOLN: 4.0000 mg | Freq: Four times a day (QID) | INTRAMUSCULAR | Status: DC | PRN

## 2019-05-19 MED ORDER — FOLIC ACID 1 MG PO TABS
1.0000 mg | ORAL_TABLET | Freq: Every day | ORAL | Status: DC
  Administered 2019-05-19 – 2019-05-22 (×4): 1 mg via ORAL
  Filled 2019-05-19 (×4): qty 1

## 2019-05-19 MED ORDER — THIAMINE HCL 100 MG PO TABS
100.0000 mg | ORAL_TABLET | Freq: Every day | ORAL | Status: DC
  Administered 2019-05-19 – 2019-05-22 (×3): 100 mg via ORAL
  Filled 2019-05-19 (×4): qty 1

## 2019-05-19 NOTE — Progress Notes (Signed)
[facility] [place], [place] [address]  Internal MEDICINE  Office Visit Note  Patient Name: [name]  [id number]  [id number]  Date of Service: 05/19/2019  Chief Complaint  Patient presents with  . Sore    left leg, has gotten worse, inflamed, warm to touch, pus, craters, tingling in foot and upper thigh     HPI Pt is here for a sick visit. Pt has swollen left lower leg with wound. He also has multiple wounds around this body including head that are similar, however do not appear infected.  It is warm to touch, significantly swollen and draining form multiple areas.  They have been present for a few weeks but have become much worse over the last 4-5 days.  He has multiple sites around his body as well, including right shoulder, and posterior occiput. They are all in various stages of healing, the most concerning is the left lower extremity.        Current Medication:  Outpatient Encounter Medications as of 05/19/2019  Medication Sig  . albuterol (VENTOLIN HFA) 108 (90 Base) MCG/ACT inhaler Inhale 1-2 puffs into the lungs every 6 (six) hours as needed for wheezing or shortness of breath.  . bictegravir-emtricitabine-tenofovir AF (BIKTARVY) 50-200-25 MG TABS tablet Take by mouth.  . cyclobenzaprine (FLEXERIL) 10 MG tablet TAKE 1 TABLET BY MOUTH TWICE A DAY AS NEEDED FOR MUSCLE SPASMS  . escitalopram (LEXAPRO) 20 MG tablet TAKE 1 TABLET BY MOUTH DAILY WITH SUPPER  . hydrOXYzine (ATARAX/VISTARIL) 25 MG tablet Take 1 tablet (25 mg total) by mouth 2 (two) times daily.  Marland Kitchen lisinopril (ZESTRIL) 10 MG tablet Take 1 tablet (10 mg total) by mouth daily.  . mirtazapine (REMERON) 30 MG tablet TAKE 1 TABLET BY MOUTH AT BEDTIME.  . Na Sulfate-K Sulfate-Mg Sulf (SUPREP BOWEL PREP KIT) 17.5-3.13-1.6 GM/177ML SOLN Take 1 kit by mouth as directed.  . valACYclovir (VALTREX) 1000 MG tablet Take 1 tablet (1,000 mg total) by mouth 2 (two) times daily.  . varenicline (CHANTIX  STARTING MONTH PAK) 0.5 MG X 11 & 1 MG X 42 tablet Take one 0.5 mg tablet by mouth once daily for 3 days, then increase to one 0.5 mg tablet twice daily for 4 days, then increase to one 1 mg tablet twice daily.   No facility-administered encounter medications on file as of 05/19/2019.      Medical History: Past Medical History:  Diagnosis Date  . Allergy   . COPD (chronic obstructive pulmonary disease) ([place])   . 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  . HIV (human immunodeficiency virus infection) ([place])   . Hypertension      Vital Signs: BP 139/82   Pulse 93   Temp (!) 97.4 F (36.3 C)   Ht '5\' 9"'  (1.753 m)   Wt 172 lb 9.6 oz (78.3 kg)   SpO2 98%   BMI 25.49 kg/m    Review of Systems  Constitutional: Negative.  Negative for chills, fatigue and unexpected weight change.  HENT: Negative.  Negative for congestion, rhinorrhea, sneezing and sore throat.   Eyes: Negative for redness.  Respiratory: Negative.  Negative for cough, chest tightness and shortness of breath.   Cardiovascular: Negative.  Negative for chest pain and palpitations.  Gastrointestinal: Negative.  Negative for abdominal pain, constipation, diarrhea, nausea and vomiting.  Endocrine: Negative.   Genitourinary: Negative.  Negative for dysuria and frequency.  Musculoskeletal: Negative.  Negative for arthralgias, back pain, joint swelling  and neck pain.  Skin: Positive for wound. Negative for rash.       Wound with reddness, heat and purulent drainage to left lower leg. Also has scattered wounds in various stages of healing through out body.   Allergic/Immunologic: Negative.   Neurological: Negative.  Negative for tremors and numbness.  Hematological: Negative for adenopathy. Does not bruise/bleed easily.  Psychiatric/Behavioral: Negative.  Negative for behavioral problems, sleep disturbance and suicidal ideas. The patient is not nervous/anxious.     Physical Exam Vitals and nursing note reviewed.   Constitutional:      General: He is not in acute distress.    Appearance: He is well-developed. He is not diaphoretic.  HENT:     Head: Normocephalic and atraumatic.     Mouth/Throat:     Pharynx: No oropharyngeal exudate.  Eyes:     Pupils: Pupils are equal, round, and reactive to light.  Neck:     Thyroid: No thyromegaly.     Vascular: No JVD.     Trachea: No tracheal deviation.  Cardiovascular:     Rate and Rhythm: Normal rate and regular rhythm.     Heart sounds: Normal heart sounds. No murmur. No friction rub. No gallop.   Pulmonary:     Effort: Pulmonary effort is normal. No respiratory distress.     Breath sounds: Normal breath sounds. No wheezing or rales.  Chest:     Chest wall: No tenderness.  Abdominal:     Palpations: Abdomen is soft.     Tenderness: There is no abdominal tenderness. There is no guarding.  Musculoskeletal:        General: Normal range of motion.     Cervical back: Normal range of motion and neck supple.  Lymphadenopathy:     Cervical: No cervical adenopathy.  Skin:    General: Skin is warm and dry.     Findings: Erythema and lesion present.     Comments: Draining wound, with warmth and significant circumferential swelling to lower extremity.  Pain with ambulation.  Neurological:     Mental Status: He is alert and oriented to person, place, and time.     Cranial Nerves: No cranial nerve deficit.  Psychiatric:        Behavior: Behavior normal.        Thought Content: Thought content normal.        Judgment: Judgment normal.     Assessment/Plan: 1. Cellulitis of other specified site Significant signs of infections, Pt likely to need IV antibiotics, asked him to go to Emergency room to be seen immediately.  Concern for osteomyelitis/sepsis, etc.     2. Hypertension, unspecified type Currently controlled, continue medications.   3. HIV infection, unspecified symptom status ([place]) Recent ID appt, reports undetectable viral load.  4. Alcohol  abuse Daily drinker, concern for DT's if admitted to hospital.    General Counseling: Marylou Mccoy understanding of the findings of todays visit and agrees with plan of treatment. I have discussed any further diagnostic evaluation that may be needed or ordered today. We also reviewed his medications today. he has been encouraged to call the office with any questions or concerns that should arise related to todays visit.   No orders of the defined types were placed in this encounter.   No orders of the defined types were placed in this encounter.   Time spent: 20 Minutes  This patient was seen by Orson Gear AGNP-C in Collaboration with Dr Lavera Guise as a  part of collaborative care agreement.  Kendell Bane AGNP-C Internal Medicine

## 2019-05-19 NOTE — Consult Note (Signed)
PHARMACY -  BRIEF ANTIBIOTIC NOTE   Pharmacy has received consult(s) for cellulitis from an ED provider.  The patient's profile has been reviewed for ht/wt/allergies/indication/available labs.    One time order(s) placed for vancomycin  Further antibiotics/pharmacy consults should be ordered by admitting physician if indicated.                       Thank you, Oswald Hillock 05/19/2019  5:07 PM

## 2019-05-19 NOTE — Consult Note (Signed)
Pharmacy Antibiotic Note  [name] is a 54 y.o. male admitted on 05/19/2019 with cellulitis.  Pharmacy has been consulted for vancomycin dosing.  Plan: Pt received a vancomycin 1000 mg in the ED. Will order vancomycin 750 mg for a total of 1750 mg loading dose. Will order a maintenance dose of 1250 mg q12H with a predicted AUC of 490. Goal AUC 400-550. Css min 12.6. Scr used 0.8. Plan to get levels prior to the 4-5 dose.   Height: 5\' 9"  (175.3 cm) Weight: 164 lb (74.4 kg) IBW/kg (Calculated) : 70.7  Temp (24hrs), Avg:97.9 F (36.6 C), Min:97.4 F (36.3 C), Max:98.3 F (36.8 C)  Recent Labs  Lab 05/19/19 1542 05/19/19 1725  WBC 13.9*  --   CREATININE 0.69  --   LATICACIDVEN 1.8 1.6    Estimated Creatinine Clearance: 109.2 mL/min (by C-G formula based on SCr of 0.69 mg/dL).    Allergies  Allergen Reactions  . Other     Dust mites and roaches     Antimicrobials this admission: 1/27 vancomycin >>  1/27 ceftriaxone  >>   Dose adjustments this admission: none  Thank you for allowing pharmacy to be a part of this patient's care.  Oswald Hillock, PharmD, BCPS 05/19/2019 6:51 PM

## 2019-05-19 NOTE — ED Provider Notes (Signed)
[facility] Emergency Department Provider Note  ____________________________________________  Time seen: Approximately 5:56 PM  I have reviewed the triage vital signs and the nursing notes.   HISTORY  Chief Complaint Wound Infection    HPI [name] is a 54 y.o. male with a history of COPD hypertension HIV who complains of redness, swelling, pain, open wound to the left anterior shin that has been there for the past 5 days.  Reports being seen by his doctor  but not starting any antibiotics for it.  Denies chest pain or shortness of breath, reports worsening pain and purulent drainage from the wound in the leg.  Reports compliance with antiretroviral therapy and negative HIV counts.  Reviewed electronic medical record/care everywhere, shows that his HIV RNA was undetectable recently at [facility].     Past Medical History:  Diagnosis Date  . Allergy   . COPD (chronic obstructive pulmonary disease) ([place])   . 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  . HIV (human immunodeficiency virus infection) ([place])   . Hypertension      Patient Active Problem List   Diagnosis Date Noted  . Special screening for malignant neoplasms, colon   . Polyp of transverse colon   . HIV (human immunodeficiency virus infection) ([place]) 03/03/2018  . Acute bronchitis with COPD ([place]) 03/03/2018     Past Surgical History:  Procedure Laterality Date  . COLONOSCOPY WITH PROPOFOL N/A 03/22/2019   Procedure: COLONOSCOPY WITH BIOPSIES;  Surgeon: Lucilla Lame, MD;  Location: [place];  Service: Endoscopy;  Laterality: N/A;  . POLYPECTOMY N/A 03/22/2019   Procedure: POLYPECTOMY;  Surgeon: Lucilla Lame, MD;  Location: [place];  Service: Endoscopy;  Laterality: N/A;     Prior to Admission medications   Medication Sig Start Date End Date Taking? Authorizing Provider  albuterol (VENTOLIN HFA) 108 (90 Base) MCG/ACT inhaler Inhale 1-2 puffs into the lungs every 6 (six)  hours as needed for wheezing or shortness of breath. 05/14/19   Scarboro, Audie Clear, NP  bictegravir-emtricitabine-tenofovir AF (BIKTARVY) 50-200-25 MG TABS tablet Take by mouth. 03/24/18   [provider]  cyclobenzaprine (FLEXERIL) 10 MG tablet TAKE 1 TABLET BY MOUTH TWICE A DAY AS NEEDED FOR MUSCLE SPASMS 03/21/19   Lavera Guise, MD  escitalopram (LEXAPRO) 20 MG tablet TAKE 1 TABLET BY MOUTH DAILY WITH SUPPER 05/14/19   Kendell Bane, NP  hydrOXYzine (ATARAX/VISTARIL) 25 MG tablet Take 1 tablet (25 mg total) by mouth 2 (two) times daily. 12/04/18   Kendell Bane, NP  lisinopril (ZESTRIL) 10 MG tablet Take 1 tablet (10 mg total) by mouth daily. 05/14/19   Kendell Bane, NP  mirtazapine (REMERON) 30 MG tablet TAKE 1 TABLET BY MOUTH AT BEDTIME. 04/06/19   Lavera Guise, MD  Na Sulfate-K Sulfate-Mg Sulf (SUPREP BOWEL PREP Justin) 17.5-3.13-1.6 GM/177ML SOLN Take 1 Justin by mouth as directed. 03/17/19   Lucilla Lame, MD  valACYclovir (VALTREX) 1000 MG tablet Take 1 tablet (1,000 mg total) by mouth 2 (two) times daily. 10/14/18   Kendell Bane, NP  varenicline (CHANTIX STARTING MONTH PAK) 0.5 MG X 11 & 1 MG X 42 tablet Take one 0.5 mg tablet by mouth once daily for 3 days, then increase to one 0.5 mg tablet twice daily for 4 days, then increase to one 1 mg tablet twice daily. 10/14/18   Kendell Bane, NP     Allergies Other   Family History  Adopted: Yes  Social History Social History   Tobacco Use  . Smoking status: Current Every Day Smoker    Packs/day: 1.50    Years: 30.00    Pack years: 45.00    Types: Cigarettes  . Smokeless tobacco: Never Used  . Tobacco comment: since age 79  Substance Use Topics  . Alcohol use: Yes    Alcohol/week: 21.0 standard drinks    Types: 21 Shots of liquor per week  . Drug use: Never    Review of Systems  Constitutional:   No fever or chills.  ENT:   No sore throat. No rhinorrhea. Cardiovascular:   No chest pain or  syncope. Respiratory:   No dyspnea or cough. Gastrointestinal:   Negative for abdominal pain, vomiting and diarrhea.  Musculoskeletal: Left leg pain and swelling as above All other systems reviewed and are negative except as documented above in ROS and HPI.  ____________________________________________   PHYSICAL EXAM:  VITAL SIGNS: ED Triage Vitals  Enc Vitals Group     BP 05/19/19 1539 128/74     Pulse Rate 05/19/19 1539 95     Resp 05/19/19 1539 20     Temp 05/19/19 1539 98.3 F (36.8 C)     Temp Source 05/19/19 1539 Oral     SpO2 05/19/19 1539 98 %     Weight 05/19/19 1442 164 lb (74.4 kg)     Height 05/19/19 1442 '5\' 9"'  (1.753 m)     Head Circumference --      Peak Flow --      Pain Score 05/19/19 1441 7     Pain Loc --      Pain Edu? --      Excl. in [place]? --     Vital signs reviewed, nursing assessments reviewed.   Constitutional:   Alert and oriented. Non-toxic appearance. Eyes:   Conjunctivae are normal. EOMI. PERRL. ENT      Head:   Normocephalic and atraumatic.      Nose:   Wearing a mask.      Mouth/Throat:   Wearing a mask.      Neck:   No meningismus. Full ROM. Hematological/Lymphatic/Immunilogical:   No cervical lymphadenopathy. Cardiovascular:   RRR. Symmetric bilateral radial and DP pulses.  No murmurs. Cap refill less than 2 seconds. Respiratory:   Normal respiratory effort without tachypnea/retractions. Breath sounds are clear and equal bilaterally. No wheezes/rales/rhonchi. Gastrointestinal:   Soft and nontender. Non distended. There is no CVA tenderness.  No rebound, rigidity, or guarding.  Musculoskeletal:   Normal range of motion in all extremities.  There is diffuse erythema with purplish discoloration over the anterior shin centered around a 4 cm soft tissue wound with purulent drainage.  No crepitus.  There is lymphangitic streaking.  There is swelling, warmth, tenderness. Neurologic:   Normal speech and language.  Motor grossly intact. No  acute focal neurologic deficits are appreciated.  Skin:    Skin is warm, dry with left lower extremity wound and inflammatory changes as above. No rash noted.  No petechiae, purpura, or bullae.  There are scattered other superficial skin wounds in various stages of healing on multiple body areas.  ____________________________________________    LABS (pertinent positives/negatives) (all labs ordered are listed, but only abnormal results are displayed) Labs Reviewed  COMPREHENSIVE METABOLIC PANEL - Abnormal; Notable for the following components:      Result Value   Potassium 3.1 (*)    Calcium 8.6 (*)    Albumin 3.2 (*)  All other components within normal limits  CBC WITH DIFFERENTIAL/PLATELET - Abnormal; Notable for the following components:   WBC 13.9 (*)    RBC 3.79 (*)    HCT 36.7 (*)    MCH 34.6 (*)    Neutro Abs 10.0 (*)    Monocytes Absolute 1.4 (*)    Abs Immature Granulocytes 0.08 (*)    All other components within normal limits  SARS CORONAVIRUS 2 (TAT 6-24 HRS)  LACTIC ACID, PLASMA  LACTIC ACID, PLASMA   ____________________________________________   EKG    ____________________________________________    RADIOLOGY  No results found.  ____________________________________________   PROCEDURES Procedures  ____________________________________________    CLINICAL IMPRESSION / ASSESSMENT AND PLAN / ED COURSE  Medications ordered in the ED: Medications  vancomycin (VANCOCIN) IVPB 1000 mg/200 mL premix (has no administration in time range)  cefTRIAXone (ROCEPHIN) 2 g in sodium chloride 0.9 % 100 mL IVPB (2 g Intravenous New Bag/Given 05/19/19 1727)  sodium chloride 0.9 % bolus 1,000 mL (1,000 mLs Intravenous New Bag/Given 05/19/19 1727)    Pertinent labs & imaging results that were available during my care of the patient were reviewed by me and considered in my medical decision making (see chart for details).  [name] was evaluated in Emergency  Department on 05/19/2019 for the symptoms described in the history of present illness. He was evaluated in the context of the global COVID-19 pandemic, which necessitated consideration that the patient might be at risk for infection with the SARS-CoV-2 virus that causes COVID-19. Institutional protocols and algorithms that pertain to the evaluation of patients at risk for COVID-19 are in a state of rapid change based on information released by regulatory bodies including the CDC and federal and state organizations. These policies and algorithms were followed during the patient's care in the ED.   Patient presents with cellulitis of the left leg.  Does not appear to have a cutaneous abscess.  Doubt necrotizing fasciitis or osteomyelitis given the duration of symptoms and lack of crepitus.  Tib-fib x-ray obtained which looks unremarkable for any deep space complication.  Vital signs are unremarkable, patient is not septic.  However with his comorbidities and extensive soft tissue infection, will plan to hospitalize for IV antibiotics, starting vancomycin and ceftriaxone now.  Labs show leukocytosis of 14,000, mild hypokalemia.      ____________________________________________   FINAL CLINICAL IMPRESSION(S) / ED DIAGNOSES    Final diagnoses:  Cellulitis of left lower extremity  HIV infection, unspecified symptom status [facility])     ED Discharge Orders    None      Portions of this note were generated with dragon dictation software. Dictation errors may occur despite best attempts at proofreading.   Carrie Mew, MD 05/19/19 [phone]

## 2019-05-19 NOTE — H&P (Signed)
TRH H&P    Patient Demographics:    [name], is a 54 y.o. male  MRN: [id number]  DOB - 31-Mar-1966  Admit Date - 05/19/2019  Referring MD/NP/PA:   Carrie Mew  Outpatient Primary MD for the patient is Kendell Bane, NP  Patient coming from:  home  Chief complaint-   cellulitis   HPI:    [name]  is a 54 y.o. male,  w HIV, Hypertension, Copd not on home o2, apparently presents with redness of the left distal lower ext , which began on 05/14/2019, started as a small red bump.  No fever, no chills.  Pt notes that redness now from the left ankle up to the knee with slight ulcer 75mm mid way up.   In ED.  T 98.3, P 95 R 20, Bp 128/74 pox 98% on RA Wt 74.4kg  Wbc 13.9, Hgb 13.1, Plt 218 Na 137, K 3.1, Bun 10, Creatinine 0.69 Ast 34, Alt 18, Alb 3.2 Lactic acid 1.6  Pt given vanco iv, an rocephin 2gm iv x1 in the ED   ED requesting patient admission for cellulitis.        Review of systems:    In addition to the HPI above,  No Fever-chills, No Headache, No changes with Vision or hearing, No problems swallowing food or Liquids, No Chest pain, Cough or Shortness of Breath, No Abdominal pain, No Nausea or Vomiting, bowel movements are regular, No Blood in stool or Urine, No dysuria, No new skin rashes or bruises, No new joints pains-aches,  No new weakness, tingling, numbness in any extremity, No recent weight gain or loss, No polyuria, polydypsia or polyphagia, No significant Mental Stressors.  All other systems reviewed and are negative.    Past History of the following :    Past Medical History:  Diagnosis Date  . Allergy   . COPD (chronic obstructive pulmonary disease) ([place])   . 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  . HIV (human immunodeficiency virus infection) ([place])   . Hypertension       Past Surgical History:  Procedure Laterality Date  . COLONOSCOPY WITH  PROPOFOL N/A 03/22/2019   Procedure: COLONOSCOPY WITH BIOPSIES;  Surgeon: Lucilla Lame, MD;  Location: [place];  Service: Endoscopy;  Laterality: N/A;  . POLYPECTOMY N/A 03/22/2019   Procedure: POLYPECTOMY;  Surgeon: Lucilla Lame, MD;  Location: [place];  Service: Endoscopy;  Laterality: N/A;      Social History:      Social History   Tobacco Use  . Smoking status: Current Every Day Smoker    Packs/day: 1.50    Years: 30.00    Pack years: 45.00    Types: Cigarettes  . Smokeless tobacco: Never Used  . Tobacco comment: since age 60  Substance Use Topics  . Alcohol use: Yes    Alcohol/week: 21.0 standard drinks    Types: 21 Shots of liquor per week       Family History :     Family History  Adopted: Yes       Home Medications:   Prior to Admission medications   Medication Sig Start Date End Date Taking? Authorizing Provider  albuterol (VENTOLIN HFA) 108 (90 Base) MCG/ACT inhaler Inhale 1-2 puffs into the lungs every 6 (six) hours as needed for wheezing or shortness of breath. 05/14/19  Yes Scarboro, Audie Clear, NP  bictegravir-emtricitabine-tenofovir AF (BIKTARVY) 50-200-25 MG TABS tablet Take 1 tablet by mouth daily.  03/24/18  Yes [provider]  cetirizine (ZYRTEC) 10 MG tablet Take 10 mg by mouth daily.   Yes [provider]  escitalopram (LEXAPRO) 20 MG tablet TAKE 1 TABLET BY MOUTH DAILY WITH SUPPER 05/14/19  Yes Scarboro, Audie Clear, NP  fluticasone (FLONASE) 50 MCG/ACT nasal spray Place 1 spray into both nostrils daily.   Yes [provider]  lisinopril (ZESTRIL) 10 MG tablet Take 1 tablet (10 mg total) by mouth daily. 05/14/19  Yes Scarboro, Audie Clear, NP  mirtazapine (REMERON) 30 MG tablet TAKE 1 TABLET BY MOUTH AT BEDTIME. 04/06/19  Yes Lavera Guise, MD  valACYclovir (VALTREX) 1000 MG tablet Take 1 tablet (1,000 mg total) by mouth 2 (two) times daily. Patient taking differently: Take 1,000 mg by mouth 2 (two) times daily as  needed.  10/14/18  Yes Kendell Bane, NP     Allergies:     Allergies  Allergen Reactions  . Other     Dust mites and roaches      Physical Exam:   Vitals  Blood pressure 139/84, pulse 80, temperature 98.3 F (36.8 C), temperature source Oral, resp. rate 19, height 5\' 9"  (1.753 m), weight 74.4 kg, SpO2 100 %.  1.  General: axoxo3  2. Psychiatric: euthymic  3. Neurologic: nonfocal  4. HEENMT:  Anicteric, pupil 1.22mm symmetric, direct, consensual, near intact Neck: no jvd  5. Respiratory : CTAB  6. Cardiovascular : rrr s1, s2, no m/g/r  7. Gastrointestinal:  Abd: soft, nt, nd, +bs  8. Skin:  Ext: no c/c,  Slight edema of the left distal lower ext.  Redness from the left ankle to the left knee , fully circumferential of the left distal lower ext. + warm, + tenderness.  25mm skin ulcer mid way up the left distal lower ext  follicultis on the back of the scalp, and also 3 scab over lesions near the left knee  9.Musculoskeletal:  Good ROM    Data Review:    CBC Recent Labs  Lab 05/19/19 1542  WBC 13.9*  HGB 13.1  HCT 36.7*  PLT 218  MCV 96.8  MCH 34.6*  MCHC 35.7  RDW 13.3  LYMPHSABS 2.2  MONOABS 1.4*  EOSABS 0.1  BASOSABS 0.1   ------------------------------------------------------------------------------------------------------------------  Results for orders placed or performed during the hospital encounter of 05/19/19 (from the past 48 hour(s))  Lactic acid, plasma     Status: None   Collection Time: 05/19/19  3:42 PM  Result Value Ref Range   Lactic Acid, Venous 1.8 0.5 - 1.9 mmol/L    Comment: Performed at [facility], [address], [place],  [address]  Comprehensive metabolic panel     Status: Abnormal   Collection Time: 05/19/19  3:42 PM  Result Value Ref Range   Sodium 137 135 - 145 mmol/L   Potassium 3.1 (L) 3.5 - 5.1 mmol/L   Chloride 102 98 - 111 mmol/L   CO2 25 22 - 32 mmol/L   Glucose, Bld 89 70 - 99  mg/dL  BUN 10 6 - 20 mg/dL   Creatinine, Ser 0.69 0.61 - 1.24 mg/dL   Calcium 8.6 (L) 8.9 - 10.3 mg/dL   Total Protein 7.0 6.5 - 8.1 g/dL   Albumin 3.2 (L) 3.5 - 5.0 g/dL   AST 34 15 - 41 U/L   ALT 18 0 - 44 U/L   Alkaline Phosphatase 68 38 - 126 U/L   Total Bilirubin 0.5 0.3 - 1.2 mg/dL   GFR calc non Af Amer >60 >60 mL/min   GFR calc Af Amer >60 >60 mL/min   Anion gap 10 5 - 15    Comment: Performed at [facility], [address] [place], [place] [address]  CBC with Differential     Status: Abnormal   Collection Time: 05/19/19  3:42 PM  Result Value Ref Range   WBC 13.9 (H) 4.0 - 10.5 K/uL   RBC 3.79 (L) 4.22 - 5.81 MIL/uL   Hemoglobin 13.1 13.0 - 17.0 g/dL   HCT 36.7 (L) 39.0 - 52.0 %   MCV 96.8 80.0 - 100.0 fL   MCH 34.6 (H) 26.0 - 34.0 pg   MCHC 35.7 30.0 - 36.0 g/dL   RDW 13.3 11.5 - 15.5 %   Platelets 218 150 - 400 K/uL   nRBC 0.0 0.0 - 0.2 %   Neutrophils Relative % 71 %   Neutro Abs 10.0 (H) 1.7 - 7.7 K/uL   Lymphocytes Relative 16 %   Lymphs Abs 2.2 0.7 - 4.0 K/uL   Monocytes Relative 10 %   Monocytes Absolute 1.4 (H) 0.1 - 1.0 K/uL   Eosinophils Relative 1 %   Eosinophils Absolute 0.1 0.0 - 0.5 K/uL   Basophils Relative 1 %   Basophils Absolute 0.1 0.0 - 0.1 K/uL   Immature Granulocytes 1 %   Abs Immature Granulocytes 0.08 (H) 0.00 - 0.07 K/uL    Comment: Performed at [facility], [address] [place], [place] [address]  Lactic acid, plasma     Status: None   Collection Time: 05/19/19  5:25 PM  Result Value Ref Range   Lactic Acid, Venous 1.6 0.5 - 1.9 mmol/L    Comment: Performed at [facility], [address] [place], [place] [address]    Chemistries  Recent Labs  Lab 05/19/19 1542  NA 137  K 3.1*  CL 102  CO2 25  GLUCOSE 89  BUN 10  CREATININE 0.69  CALCIUM 8.6*  AST 34  ALT 18  ALKPHOS 68  BILITOT 0.5    ------------------------------------------------------------------------------------------------------------------  ------------------------------------------------------------------------------------------------------------------ GFR: Estimated Creatinine Clearance: 109.2 mL/min (by C-G formula based on SCr of 0.69 mg/dL). Liver Function Tests: Recent Labs  Lab 05/19/19 1542  AST 34  ALT 18  ALKPHOS 68  BILITOT 0.5  PROT 7.0  ALBUMIN 3.2*   No results for input(s): LIPASE, AMYLASE in the last 168 hours. No results for input(s): AMMONIA in the last 168 hours. Coagulation Profile: No results for input(s): INR, PROTIME in the last 168 hours. Cardiac Enzymes: No results for input(s): CKTOTAL, CKMB, CKMBINDEX, TROPONINI in the last 168 hours. BNP (last 3 results) No results for input(s): PROBNP in the last 8760 hours. HbA1C: No results for input(s): HGBA1C in the last 72 hours. CBG: No results for input(s): GLUCAP in the last 168 hours. Lipid Profile: No results for input(s): CHOL, HDL, LDLCALC, TRIG, CHOLHDL, LDLDIRECT in the last 72 hours. Thyroid Function Tests: No results for input(s): TSH, T4TOTAL, FREET4, T3FREE, THYROIDAB in the last  72 hours. Anemia Panel: No results for input(s): VITAMINB12, FOLATE, FERRITIN, TIBC, IRON, RETICCTPCT in the last 72 hours.  --------------------------------------------------------------------------------------------------------------- Urine analysis:    Component Value Date/Time   APPEARANCEUR Clear 04/27/2018 0858   GLUCOSEU Negative 04/27/2018 0858   BILIRUBINUR Negative 04/27/2018 0858   PROTEINUR Negative 04/27/2018 0858   NITRITE Negative 04/27/2018 0858   LEUKOCYTESUR Negative 04/27/2018 0858      Imaging Results:    DG Tibia/Fibula Left  Result Date: 05/19/2019 CLINICAL DATA:  Anterior lower leg wound with purulent drainage. EXAM: LEFT TIBIA AND FIBULA - 2 VIEW COMPARISON:  None. FINDINGS: There is no evidence of  fracture or other focal bone lesions. No bony destruction or periosteal reaction. Superficial ulceration over the anterior mid lower leg. Diffuse soft tissue swelling. IMPRESSION: 1. Superficial ulceration over the anterior mid lower leg with diffuse soft tissue swelling. No acute osseous abnormality. Electronically Signed   By: Titus Dubin M.D.   On: 05/19/2019 17:54       Assessment & Plan:    Active Problems:   Cellulitis   Cellulitis w Leukocytosis and wound Wound culture vanco iv, pharmacy to dose Rocephin 1gm iv qday  Left lower ext edema Ultrasound r/o DVT  Hypokalemia Replete Check cmp in am  Moderate protein calorie malnutrition Prostat 30 mL po bid  Hiv Cont Biktarvy  Hypertension Cont Lisinopril 10mg  po qday  Anxiety/ Depression Cont Lexapro 20mg  po qday Cont Remeron 30mg  po qhs   DVT Prophylaxis-   Lovenox - SCDs     AM Labs Ordered, also please review Full Orders  Family Communication: Admission, patients condition and plan of care including tests being ordered have been discussed with the patient  who indicate understanding and agree with the plan and Code Status.  Code Status:  FULL CODE per patient, left message for daughter of admission to [facility]  Admission status:   Inpatient: Based on patients clinical presentation and evaluation of above clinical data, I have made determination that patient meets Inpatient criteria at this time.  Pt has severe cellulitis of the left distal lower ext,  Pt will require iv abx,  Due to the severity of the cellulitis pt will require > 2 nites stay. Has high risk of clinical deterioration.   Time spent in minutes : 55 minutes   Jani Gravel M.D on 05/19/2019 at 6:39 PM

## 2019-05-19 NOTE — ED Triage Notes (Signed)
Large area noted to left shin with dark center, redness around it and heat.

## 2019-05-19 NOTE — ED Triage Notes (Signed)
Pt reports has been dealing with an infection on his left leg since Jan 22nd. Pt reports area keeps getting worse.

## 2019-05-20 ENCOUNTER — Inpatient Hospital Stay: Payer: BC Managed Care – PPO

## 2019-05-20 DIAGNOSIS — L03116 Cellulitis of left lower limb: Secondary | ICD-10-CM | POA: Diagnosis not present

## 2019-05-20 DIAGNOSIS — M7989 Other specified soft tissue disorders: Secondary | ICD-10-CM | POA: Diagnosis not present

## 2019-05-20 DIAGNOSIS — L0390 Cellulitis, unspecified: Secondary | ICD-10-CM | POA: Diagnosis not present

## 2019-05-20 DIAGNOSIS — B20 Human immunodeficiency virus [HIV] disease: Secondary | ICD-10-CM | POA: Diagnosis not present

## 2019-05-20 DIAGNOSIS — L02416 Cutaneous abscess of left lower limb: Secondary | ICD-10-CM | POA: Diagnosis not present

## 2019-05-20 DIAGNOSIS — E440 Moderate protein-calorie malnutrition: Secondary | ICD-10-CM | POA: Diagnosis not present

## 2019-05-20 LAB — CBC
HCT: 34.4 % — ABNORMAL LOW (ref 39.0–52.0)
Hemoglobin: 12.1 g/dL — ABNORMAL LOW (ref 13.0–17.0)
MCH: 34.5 pg — ABNORMAL HIGH (ref 26.0–34.0)
MCHC: 35.2 g/dL (ref 30.0–36.0)
MCV: 98 fL (ref 80.0–100.0)
Platelets: 208 10*3/uL (ref 150–400)
RBC: 3.51 MIL/uL — ABNORMAL LOW (ref 4.22–5.81)
RDW: 13.5 % (ref 11.5–15.5)
WBC: 10.4 10*3/uL (ref 4.0–10.5)
nRBC: 0 % (ref 0.0–0.2)

## 2019-05-20 LAB — BASIC METABOLIC PANEL
Anion gap: 10 (ref 5–15)
BUN: 5 mg/dL — ABNORMAL LOW (ref 6–20)
CO2: 25 mmol/L (ref 22–32)
Calcium: 7.9 mg/dL — ABNORMAL LOW (ref 8.9–10.3)
Chloride: 100 mmol/L (ref 98–111)
Creatinine, Ser: 0.73 mg/dL (ref 0.61–1.24)
GFR calc Af Amer: >60 mL/min (ref >60)
GFR calc non Af Amer: >60 mL/min (ref >60)
Glucose, Bld: 85 mg/dL (ref 70–99)
Potassium: 3.6 mmol/L (ref 3.5–5.1)
Sodium: 135 mmol/L (ref 135–145)

## 2019-05-20 LAB — MAGNESIUM: Magnesium: 2 mg/dL (ref 1.7–2.4)

## 2019-05-20 MED ORDER — GABAPENTIN 300 MG PO CAPS
300.0000 mg | ORAL_CAPSULE | Freq: Three times a day (TID) | ORAL | Status: DC
  Administered 2019-05-20 – 2019-05-22 (×5): 300 mg via ORAL
  Filled 2019-05-20 (×5): qty 1

## 2019-05-20 MED ORDER — VANCOMYCIN HCL 1250 MG/250ML IV SOLN
1250.0000 mg | Freq: Two times a day (BID) | INTRAVENOUS | Status: DC
  Administered 2019-05-20 – 2019-05-22 (×4): 1250 mg via INTRAVENOUS
  Filled 2019-05-20 (×6): qty 250

## 2019-05-20 NOTE — Anesthesia Preprocedure Evaluation (Deleted)
Anesthesia Evaluation    Airway        Dental   Pulmonary Current Smoker and Patient abstained from smoking.,           Cardiovascular hypertension,      Neuro/Psych    GI/Hepatic   Endo/Other    Renal/GU      Musculoskeletal   Abdominal   Peds  Hematology   Anesthesia Other Findings Past Medical History: No date: Allergy No date: COPD (chronic obstructive pulmonary disease) ([facility]) No date: Frostbite of both hands     Comment:  and left arm No date: HIV (human immunodeficiency virus infection) ([place]) No date: Hypertension   Reproductive/Obstetrics                            Anesthesia Physical Anesthesia Plan Anesthesia Quick Evaluation

## 2019-05-20 NOTE — Consult Note (Signed)
Pharmacy Antibiotic Note  [name] is a 54 y.o. male admitted on 05/19/2019 with cellulitis.  Pharmacy has been consulted for vancomycin dosing.  Plan: Patient received vancomycin loading dose of  1750 mg 1/27.  Will reorder Vancomycin  1250 mg q12H.. Goal AUC 400-550. Expected AUC: 467 SCr used: 0.8  Plan to get levels prior to the 4-5 dose.   Height: 5\' 9"  (175.3 cm) Weight: 164 lb (74.4 kg) IBW/kg (Calculated) : 70.7  Temp (24hrs), Avg:98.8 F (37.1 C), Min:98.3 F (36.8 C), Max:99.3 F (37.4 C)  Recent Labs  Lab 05/19/19 1542 05/19/19 1725 05/19/19 1916 05/19/19 2202 05/20/19 0453  WBC 13.9*  --   --  10.7* 10.4  CREATININE 0.69  --  0.65  --  0.73  LATICACIDVEN 1.8 1.6  --   --   --     Estimated Creatinine Clearance: 109.2 mL/min (by C-G formula based on SCr of 0.73 mg/dL).    Allergies  Allergen Reactions  . Other     Dust mites and roaches     Antimicrobials this admission: 1/27 vancomycin >>  1/27 ceftriaxone  >> 1/28  Dose adjustments this admission: none  Thank you for allowing pharmacy to be a part of this patient's care.  Pernell Dupre, PharmD, BCPS 05/20/2019 1:53 PM

## 2019-05-20 NOTE — Consult Note (Signed)
WOC Nurse Consult Note: Reason for Consult:Cellulitis to left lower leg, present on admission.  Is on vancomycin.   Wound type:infectious Pressure Injury POA: NA Measurement: 6 cm circumferential erythema to left lower leg with bulla present to anterior lower leg. Wound [id number] Drainage (amount, consistency, odor) minimal weeping Periwound:edema and erythema Dressing procedure/placement/frequency: Cleanse left leg with soap and water and pat dry   Apply Aquacel Ag to blister on anterior lower leg. Wrap with kerlix and tape.  Change Monday and Thursday Will not follow at this time.  Please re-consult if needed.  Domenic Moras MSN, RN, FNP-BC CWON Wound, Ostomy, Continence Nurse Pager [phone]

## 2019-05-20 NOTE — Plan of Care (Signed)

## 2019-05-20 NOTE — Progress Notes (Signed)
PROGRESS NOTE    [name]  [id number] DOB: 17-Oct-1965 DOA: 05/19/2019 PCP: Kendell Bane, NP      Brief Narrative:  [name] is a 54 y.o. M with HIV, COPD and HTN and daily EtOH use who presented with about a week of right lower extremity redness, swelling and pain around a shin ulcer.  In the ER, HR 95, WBC 13K, lactate normal.  Started on IV antibiotics.       Assessment & Plan:  Cellulitis Purulent cellulitis, with drainage.  Seems to have a fluctuant component subcutaneously.  Doppler US leg rules out DVT. -Continue vancomycin -Stop ceftriaxone -Consult Surgery re: I&D   HIV -Continue Biktarvy -Obtain CD4 count  Alcohol withdrawal Symptoms mild.  CIWA 4-10 overnight -Continue on demand lorazepam -Continue frequent CIWA scoring -Continue thiamine and folate -Start gabapentin 300 TID  COPD No active bronchospasm.    Not on O2 or maintenance inhalers at home. -Continue loratadine -Smoking cessation recommended  Hypertension BP elevated -Continue lisinopril  Mood disorder -Continue escitalopram, mirtazapine       Disposition: The patient was admitted with purulent cellulitis.  This will need incision and drainage, and continued IV antibiotics in the setting of his immunosuppression from chronic HIV infection.    I will discharge when he has completed incision and drainage, his infection has started to improve, and he is able to pack the wound himself.        MDM: The below labs and imaging reports were reviewed and summarized above.  Medication management as above.    DVT prophylaxis: Lovenox Code Status: FULL Family Communication:     Consultants:   General surgery  Procedures:   1/28 US doppler no clot  1/29 I&D pending  Antimicrobials:   Vancomycin 1/27 >>  Ceftriaxone 1.27   Culture data:   1/27 Superficial culture left leg  -- pending          Subjective: Patient still has pretty severe redness and  swelling, no real improvement from yesterday.  No fevers.  He had some hot flashes, shakes, sweats overnight due to alcohol withdrawal.  No confusion, vomiting, headache  Objective: Vitals:   05/19/19 2142 05/19/19 2332 05/20/19 0346 05/20/19 0537  BP: (!) 159/90 (!) 141/82 (!) 147/62 (!) 151/87  Pulse: (!) 101 91 92 88  Resp:      Temp:    98.9 F (37.2 C)  TempSrc:    Oral  SpO2:  96%  96%  Weight:      Height:        Intake/Output Summary (Last 24 hours) at 05/20/2019 1334 Last data filed at 05/20/2019 [id number] Gross per 24 hour  Intake 2021.55 ml  Output --  Net 2021.55 ml   Filed Weights   05/19/19 1442  Weight: 74.4 kg    Examination: General appearance: Thin adult male, alert and in no acute distress.   HEENT: Anicteric, conjunctiva pink, lids and lashes normal. No nasal deformity, discharge, epistaxis.  Lips moist, dentition normal, oropharynx naturally hydrated, no oral lesions, hearing normal.   Skin: Warm and dry.  No jaundice.  Left leg is severely swollen from the knee down, there is an anterior ulcer with some surrounding necrosis and a large fluctuance on the anterior left shin, as well as purulent drainage and swelling of the left foot.  He has scattered other ovoid excoriations/small scabbed ulcers. Cardiac: RRR, nl S1-S2, no murmurs appreciated.  Capillary refill is brisk.  JVP normal.  LE edema only  on the left.  Radial pulses 2+ and symmetric. Respiratory: Normal respiratory rate and rhythm.  CTAB without rales or wheezes. Abdomen: Abdomen soft.  No TTP or guarding. No ascites, distension, hepatosplenomegaly.   MSK: No deformities or effusions. Neuro: Awake and alert.  EOMI, moves all extremities. Speech fluent.    Psych: Sensorium intact and responding to questions, attention normal. Affect normal.  Judgment and insight appear normal.    Data Reviewed: I have personally reviewed following labs and imaging studies:  CBC: Recent Labs  Lab 05/19/19 1542  05/19/19 2202 05/20/19 0453  WBC 13.9* 10.7* 10.4  NEUTROABS 10.0*  --   --   HGB 13.1 12.9* 12.1*  HCT 36.7* 36.0* 34.4*  MCV 96.8 97.0 98.0  PLT 218 213 [id number]   Basic Metabolic Panel: Recent Labs  Lab 05/19/19 1542 05/19/19 1916 05/20/19 0453  NA 137 134* 135  K 3.1* 3.2* 3.6  CL 102 100 100  CO2 25 23 25   GLUCOSE 89 147* 85  BUN 10 8 5*  CREATININE 0.69 0.65 0.73  CALCIUM 8.6* 8.0* 7.9*  MG  --   --  2.0   GFR: Estimated Creatinine Clearance: 109.2 mL/min (by C-G formula based on SCr of 0.73 mg/dL). Liver Function Tests: Recent Labs  Lab 05/19/19 1542 05/19/19 1916  AST 34 29  ALT 18 17  ALKPHOS 68 58  BILITOT 0.5 0.8  PROT 7.0 6.5  ALBUMIN 3.2* 3.0*   No results for input(s): LIPASE, AMYLASE in the last 168 hours. No results for input(s): AMMONIA in the last 168 hours. Coagulation Profile: No results for input(s): INR, PROTIME in the last 168 hours. Cardiac Enzymes: No results for input(s): CKTOTAL, CKMB, CKMBINDEX, TROPONINI in the last 168 hours. BNP (last 3 results) No results for input(s): PROBNP in the last 8760 hours. HbA1C: No results for input(s): HGBA1C in the last 72 hours. CBG: No results for input(s): GLUCAP in the last 168 hours. Lipid Profile: No results for input(s): CHOL, HDL, LDLCALC, TRIG, CHOLHDL, LDLDIRECT in the last 72 hours. Thyroid Function Tests: No results for input(s): TSH, T4TOTAL, FREET4, T3FREE, THYROIDAB in the last 72 hours. Anemia Panel: No results for input(s): VITAMINB12, FOLATE, FERRITIN, TIBC, IRON, RETICCTPCT in the last 72 hours. Urine analysis:    Component Value Date/Time   APPEARANCEUR Clear 04/27/2018 0858   GLUCOSEU Negative 04/27/2018 0858   BILIRUBINUR Negative 04/27/2018 0858   PROTEINUR Negative 04/27/2018 0858   NITRITE Negative 04/27/2018 0858   LEUKOCYTESUR Negative 04/27/2018 0858   Sepsis Labs: @LABRCNTIP (procalcitonin:4,lacticacidven:4)  ) Recent Results (from the past 240 hour(s))  SARS  CORONAVIRUS 2 (TAT 6-24 HRS) Nasopharyngeal Nasopharyngeal Swab     Status: None   Collection Time: 05/19/19  5:25 PM   Specimen: Nasopharyngeal Swab  Result Value Ref Range Status   SARS Coronavirus 2 NEGATIVE NEGATIVE Final    Comment: (NOTE) SARS-CoV-2 target nucleic acids are NOT DETECTED. The SARS-CoV-2 RNA is generally detectable in upper and lower respiratory specimens during the acute phase of infection. Negative results do not preclude SARS-CoV-2 infection, do not rule out co-infections with other pathogens, and should not be used as the sole basis for treatment or other patient management decisions. Negative results must be combined with clinical observations, patient history, and epidemiological information. The expected result is Negative. Fact Sheet for Patients: SugarRoll.be Fact Sheet for Healthcare Providers: https://www.woods-mathews.com/ This test is not yet approved or cleared by the [place] FDA and  has been authorized for detection and/or diagnosis  of SARS-CoV-2 by FDA under an Emergency Use Authorization (EUA). This EUA will remain  in effect (meaning this test can be used) for the duration of the COVID-19 declaration under Section 56 4(b)(1) of the Act, 21 U.S.C. section 360bbb-3(b)(1), unless the authorization is terminated or revoked sooner. Performed at [facility], [place] [address], [place], [place] [address]   Aerobic Culture (superficial specimen)     Status: None (Preliminary result)   Collection Time: 05/19/19  9:06 PM   Specimen: Leg; Wound  Result Value Ref Range Status   Specimen Description   Final    LEG Performed at [facility], [address] [place], [place] [address]    Special Requests   Final    Normal Performed at [facility], [address], [place], [place] [address]    Gram Stain PENDING  Incomplete   Culture   Final    TOO YOUNG TO READ Performed at  [facility], [place] [address] [place], [place] [address]    Report Status PENDING  Incomplete         Radiology Studies: DG Tibia/Fibula Left  Result Date: 05/19/2019 CLINICAL DATA:  Anterior lower leg wound with purulent drainage. EXAM: LEFT TIBIA AND FIBULA - 2 VIEW COMPARISON:  None. FINDINGS: There is no evidence of fracture or other focal bone lesions. No bony destruction or periosteal reaction. Superficial ulceration over the anterior mid lower leg. Diffuse soft tissue swelling. IMPRESSION: 1. Superficial ulceration over the anterior mid lower leg with diffuse soft tissue swelling. No acute osseous abnormality. Electronically Signed   By: Titus Dubin M.D.   On: 05/19/2019 17:54   US Venous Img Lower Unilateral Left (DVT)  Result Date: 05/20/2019 CLINICAL DATA:  Left lower extremity skin infection with swelling and redness, initial encounter EXAM: LEFT LOWER EXTREMITY VENOUS DOPPLER ULTRASOUND TECHNIQUE: Gray-scale sonography with compression, as well as color and duplex ultrasound, were performed to evaluate the deep venous system(s) from the level of the common femoral vein through the popliteal and proximal calf veins. COMPARISON:  None. FINDINGS: VENOUS Normal compressibility of the common femoral, superficial femoral, and popliteal veins, as well as the visualized calf veins. Visualized portions of profunda femoral vein and great saphenous vein unremarkable. No filling defects to suggest DVT on grayscale or color Doppler imaging. Doppler waveforms show normal direction of venous flow, normal respiratory phasicity and response to augmentation. Limited views of the contralateral common femoral vein are unremarkable. OTHER None. Limitations: none IMPRESSION: No femoropopliteal DVT nor evidence of DVT within the visualized calf veins. Electronically Signed   By: Inez Catalina M.D.   On: 05/20/2019 11:46        Scheduled Meds: . bictegravir-emtricitabine-tenofovir AF  1 tablet  Oral Daily  . enoxaparin (LOVENOX) injection  40 mg Subcutaneous Q24H  . escitalopram  20 mg Oral QHS  . feeding supplement (PRO-STAT SUGAR FREE 64)  30 mL Oral BID  . fluticasone  1 spray Each Nare Daily  . folic acid  1 mg Oral Daily  . lisinopril  10 mg Oral Daily  . loratadine  10 mg Oral Daily  . mirtazapine  30 mg Oral QHS  . multivitamin with minerals  1 tablet Oral Daily  . thiamine  100 mg Oral Daily   Or  . thiamine  100 mg Intravenous Daily   Continuous Infusions:    LOS: 1 day    Time spent: 25 minutes    [employer],  MD [facility] 05/20/2019, 1:34 PM     Please page though [place] or Epic secure chat:  For [employer], Adult nurse

## 2019-05-20 NOTE — Progress Notes (Signed)
Patients linen changed and left leg elevated.
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
## 2019-05-20 NOTE — Progress Notes (Signed)
   [name] is a 54 year old male with PMH significant for HTN, HIV, and significant alcohol use.   He presented yesterday complaining of left leg pain for the past 5 days. On assessment Left shin shows erythema, swelling, and an open area approximately 4cm base with marked redness, and blackened tissue with moderate purulent drainage.   He rates the pain from his left leg as a 7 out of 10, and describes the pain as sharp, throbbing.  Left tib/fib xray negative for fracture, or bone involvement.  Doppler negative for DVT.     ROS: General: He denies fever, wt changes, malaise.  Cardio: He denies chest pain, chest palpitations. RESP: He denies shortness of breath, dyspnea at rest or on exertion.  GI: Denies N/V/D/C. Denies hematochezia.  GU: Denies hematuria. Denies dysuria, frequency, flank pain or discharge from genitals.  ENDO: Denies heat or cold intolerance, polyuria, polydipsia.  MUSCULO Denies unexplained myalgias, joint swelling, unexplained arthralgias, gait problems.  SKIN: Denies rash. Confirms "sore" on left lower extremity. Reports noticing this area 5 days ago. Reports seeing his PCP about this complaint, with reports of no antibiotics given. Multiple erythemas noted on skin (back, hands, chin, legs),   NEURO: Denies dizziness, weakness, numbness.  PSYCH: Reports anxiety. Denies depression, SI/HI.   EXAM:  General: [name] is a good historian.   HEENT: PERRL intact. Head is normocephalic. Hair distributions are normal. No ear discharge. Nares are patent. Oral mucosa normal, pink. Poor dental hygiene. No missing teeth.  CARDIO: S1 and S2 heard. No gallops, murmurs or rubs.  RESP: Lung fields vesicular in all areas. Diminished in right and left lower lobes. No adventitious lung sounds. Lungs resonant.   ABD: Normoactive bowel sounds in all 4 quadrants. Nontender. Soft. Tympanic percussion noted throughout.  MUSCULO: No gait abnormalities. Normal ROM in all joints.  PV: +4  edema noted in LLE. LLE dorsalis pedal pulse 1+. Cap refill less than 3 seconds in right and left LE. SKIN: Skin is warm, dry with left lower extremity wound and inflammatory changes as above. No rash noted.There are scattered other superficial skin wounds in various stages of healing on multiple body areas.  NEURO: Awake and alert. Speech fluent. CN II through XII intact.  PSYCH: Responds to questions. Attention poor. Restless.     Assessment & Plan:   LLE cellulitis -continue IV abx: VANCO and ceftriaxone. -WOC RN consult -elevate LLE on 3-4 pillows - doppler negative for DVT -consult to general surgery   Alcohol withdrawal  -Assess CIWA Q6, and every 1 hour after lorazepam given  -add gabapentin 300mg  TID   HTN -continue ACE  -heart healthy diet   HIV+ -continue antiretroviral therapy       [name], Student-FNP

## 2019-05-20 NOTE — Progress Notes (Signed)
Pt arrived from ED. Pt anxious, trembling, talking at fast pace, scattered discussion. CIWA score 10 upon admission to unit, pt states he drinks everyday "until I pass out" pt unable to estimate total amount drink/day. Pt states last alcoholic beverage was last night. Secure message sent to Sharion Settler, NP; CIWA protocol initiated, telemetry initiated. Pt now resting comfortably, CIWA 4. Pt with no needs at this time.

## 2019-05-21 ENCOUNTER — Inpatient Hospital Stay: Payer: BC Managed Care – PPO | Admitting: Anesthesiology

## 2019-05-21 ENCOUNTER — Encounter: Payer: Self-pay | Admitting: Internal Medicine

## 2019-05-21 ENCOUNTER — Encounter
Admission: EM | Disposition: A | Discharge status: Home / Self Care | Payer: Self-pay | Source: Home / Self Care | Attending: Family Medicine

## 2019-05-21 DIAGNOSIS — L02416 Cutaneous abscess of left lower limb: Secondary | ICD-10-CM | POA: Diagnosis not present

## 2019-05-21 HISTORY — PX: I & D EXTREMITY: SHX5045

## 2019-05-21 LAB — HELPER T-LYMPH-CD4 (ARMC ONLY)
% CD 4 Pos. Lymph.: 34.5 % (ref 30.8–58.5)
Absolute CD 4 Helper: 656 /uL (ref 359–1519)
Basophils Absolute: 0.1 10*3/uL (ref 0.0–0.2)
Basos: 1 %
EOS (ABSOLUTE): 0.1 10*3/uL (ref 0.0–0.4)
Eos: 1 %
Hematocrit: 34.3 % — ABNORMAL LOW (ref 37.5–51.0)
Hemoglobin: 12.4 g/dL — ABNORMAL LOW (ref 13.0–17.7)
Immature Grans (Abs): 0 10*3/uL (ref 0.0–0.1)
Immature Granulocytes: 0 %
Lymphocytes Absolute: 1.9 10*3/uL (ref 0.7–3.1)
Lymphs: 19 %
MCH: 35.9 pg — ABNORMAL HIGH (ref 26.6–33.0)
MCHC: 36.2 g/dL — ABNORMAL HIGH (ref 31.5–35.7)
MCV: 99 fL — ABNORMAL HIGH (ref 79–97)
Monocytes Absolute: 1 10*3/uL — ABNORMAL HIGH (ref 0.1–0.9)
Monocytes: 10 %
Neutrophils Absolute: 7 10*3/uL (ref 1.4–7.0)
Neutrophils: 69 %
Platelets: 220 10*3/uL (ref 150–450)
RBC: 3.45 x10E6/uL — ABNORMAL LOW (ref 4.14–5.80)
RDW: 13.3 % (ref 11.6–15.4)
WBC: 10 10*3/uL (ref 3.4–10.8)

## 2019-05-21 LAB — COMPREHENSIVE METABOLIC PANEL
ALT: 12 U/L (ref 0–44)
AST: 17 U/L (ref 15–41)
Albumin: 2.8 g/dL — ABNORMAL LOW (ref 3.5–5.0)
Alkaline Phosphatase: 56 U/L (ref 38–126)
Anion gap: 7 (ref 5–15)
BUN: 8 mg/dL (ref 6–20)
CO2: 25 mmol/L (ref 22–32)
Calcium: 8.1 mg/dL — ABNORMAL LOW (ref 8.9–10.3)
Chloride: 108 mmol/L (ref 98–111)
Creatinine, Ser: 0.73 mg/dL (ref 0.61–1.24)
GFR calc Af Amer: >60 mL/min (ref >60)
GFR calc non Af Amer: >60 mL/min (ref >60)
Glucose, Bld: 95 mg/dL (ref 70–99)
Potassium: 3.8 mmol/L (ref 3.5–5.1)
Sodium: 140 mmol/L (ref 135–145)
Total Bilirubin: 0.9 mg/dL (ref 0.3–1.2)
Total Protein: 6.6 g/dL (ref 6.5–8.1)

## 2019-05-21 LAB — SURGICAL PCR SCREEN
MRSA, PCR: NEGATIVE
Staphylococcus aureus: NEGATIVE

## 2019-05-21 LAB — CBC
HCT: 35.9 % — ABNORMAL LOW (ref 39.0–52.0)
Hemoglobin: 12.4 g/dL — ABNORMAL LOW (ref 13.0–17.0)
MCH: 34.3 pg — ABNORMAL HIGH (ref 26.0–34.0)
MCHC: 34.5 g/dL (ref 30.0–36.0)
MCV: 99.2 fL (ref 80.0–100.0)
Platelets: 214 10*3/uL (ref 150–400)
RBC: 3.62 MIL/uL — ABNORMAL LOW (ref 4.22–5.81)
RDW: 13.3 % (ref 11.5–15.5)
WBC: 8.6 10*3/uL (ref 4.0–10.5)
nRBC: 0 % (ref 0.0–0.2)

## 2019-05-21 SURGERY — IRRIGATION AND DEBRIDEMENT EXTREMITY
Anesthesia: General | Laterality: Left

## 2019-05-21 MED ORDER — LIDOCAINE HCL (CARDIAC) PF 100 MG/5ML IV SOSY
PREFILLED_SYRINGE | INTRAVENOUS | Status: DC | PRN
  Administered 2019-05-21: 100 mg via INTRAVENOUS

## 2019-05-21 MED ORDER — DEXMEDETOMIDINE HCL 200 MCG/2ML IV SOLN
INTRAVENOUS | Status: DC | PRN
  Administered 2019-05-21: 8 ug via INTRAVENOUS

## 2019-05-21 MED ORDER — MIDAZOLAM HCL 2 MG/2ML IJ SOLN
INTRAMUSCULAR | Status: DC | PRN
  Administered 2019-05-21: 2 mg via INTRAVENOUS

## 2019-05-21 MED ORDER — LACTATED RINGERS IV SOLN: INTRAVENOUS | Status: DC | PRN

## 2019-05-21 MED ORDER — PROPOFOL 10 MG/ML IV BOLUS
INTRAVENOUS | Status: DC | PRN
  Administered 2019-05-21: 200 mg via INTRAVENOUS

## 2019-05-21 MED ORDER — MORPHINE SULFATE (PF) 4 MG/ML IV SOLN
4.0000 mg | INTRAVENOUS | Status: DC | PRN
  Filled 2019-05-21 (×2): qty 1

## 2019-05-21 MED ORDER — ONDANSETRON HCL 4 MG/2ML IJ SOLN
INTRAMUSCULAR | Status: AC
  Filled 2019-05-21: qty 2

## 2019-05-21 MED ORDER — PHENYLEPHRINE HCL (PRESSORS) 10 MG/ML IV SOLN
INTRAVENOUS | Status: DC | PRN
  Administered 2019-05-21 (×2): 100 ug via INTRAVENOUS
  Administered 2019-05-21 (×2): 200 ug via INTRAVENOUS

## 2019-05-21 MED ORDER — PROPOFOL 10 MG/ML IV BOLUS
INTRAVENOUS | Status: AC
  Filled 2019-05-21: qty 20

## 2019-05-21 MED ORDER — FENTANYL CITRATE (PF) 100 MCG/2ML IJ SOLN: 25.0000 ug | INTRAMUSCULAR | Status: DC | PRN

## 2019-05-21 MED ORDER — LACTATED RINGERS IV SOLN: Freq: Once | INTRAVENOUS | Status: AC

## 2019-05-21 MED ORDER — FENTANYL CITRATE (PF) 100 MCG/2ML IJ SOLN
INTRAMUSCULAR | Status: DC | PRN
  Administered 2019-05-21: 50 ug via INTRAVENOUS

## 2019-05-21 MED ORDER — EPHEDRINE SULFATE 50 MG/ML IJ SOLN
INTRAMUSCULAR | Status: DC | PRN
  Administered 2019-05-21 (×2): 5 mg via INTRAVENOUS

## 2019-05-21 MED ORDER — ONDANSETRON HCL 4 MG/2ML IJ SOLN
INTRAMUSCULAR | Status: DC | PRN
  Administered 2019-05-21: 4 mg via INTRAVENOUS

## 2019-05-21 MED ORDER — PROMETHAZINE HCL 25 MG/ML IJ SOLN: 6.2500 mg | INTRAMUSCULAR | Status: DC | PRN

## 2019-05-21 MED ORDER — EPHEDRINE SULFATE 50 MG/ML IJ SOLN
INTRAMUSCULAR | Status: AC
  Filled 2019-05-21: qty 1

## 2019-05-21 MED ORDER — MIDAZOLAM HCL 2 MG/2ML IJ SOLN
INTRAMUSCULAR | Status: AC
  Filled 2019-05-21: qty 2

## 2019-05-21 MED ORDER — DIPHENHYDRAMINE HCL 25 MG PO CAPS
50.0000 mg | ORAL_CAPSULE | Freq: Every evening | ORAL | Status: DC | PRN
  Administered 2019-05-21: 22:00:00 50 mg via ORAL
  Filled 2019-05-21: qty 2

## 2019-05-21 MED ORDER — FENTANYL CITRATE (PF) 100 MCG/2ML IJ SOLN
INTRAMUSCULAR | Status: AC
  Filled 2019-05-21: qty 2

## 2019-05-21 MED ORDER — LIDOCAINE HCL (PF) 2 % IJ SOLN
INTRAMUSCULAR | Status: AC
  Filled 2019-05-21: qty 10

## 2019-05-21 MED ORDER — DEXAMETHASONE SODIUM PHOSPHATE 10 MG/ML IJ SOLN
INTRAMUSCULAR | Status: DC | PRN
  Administered 2019-05-21: 10 mg via INTRAVENOUS

## 2019-05-21 SURGICAL SUPPLY — 24 items
BLADE CLIPPER SURG (BLADE) ×3 IMPLANT
BLADE SURG 15 STRL LF DISP TIS (BLADE) ×1 IMPLANT
BLADE SURG 15 STRL SS (BLADE) ×2
BRUSH SCRUB EZ  4% CHG (MISCELLANEOUS) ×2
BRUSH SCRUB EZ 4% CHG (MISCELLANEOUS) ×1 IMPLANT
CANISTER SUCT 3000ML PPV (MISCELLANEOUS) ×3 IMPLANT
COVER WAND RF STERILE (DRAPES) ×3 IMPLANT
DRAPE CHEST BREAST 77X106 FENE (MISCELLANEOUS) IMPLANT
DRAPE LAPAROTOMY 77X122 PED (DRAPES) ×3 IMPLANT
DRSG AQUACEL AG ADV 3.5X 4 (GAUZE/BANDAGES/DRESSINGS) ×3 IMPLANT
ELECT REM PT RETURN 9FT ADLT (ELECTROSURGICAL) ×3
ELECTRODE REM PT RTRN 9FT ADLT (ELECTROSURGICAL) ×1 IMPLANT
GLOVE BIO SURGEON STRL SZ 6.5 (GLOVE) ×2 IMPLANT
GLOVE BIO SURGEONS STRL SZ 6.5 (GLOVE) ×1
GLOVE BIOGEL PI IND STRL 6.5 (GLOVE) ×1 IMPLANT
GLOVE BIOGEL PI INDICATOR 6.5 (GLOVE) ×2
GOWN STRL REUS W/ TWL LRG LVL3 (GOWN DISPOSABLE) ×2 IMPLANT
GOWN STRL REUS W/TWL LRG LVL3 (GOWN DISPOSABLE) ×4
NEEDLE HYPO 22GX1.5 SAFETY (NEEDLE) ×3 IMPLANT
NS IRRIG 1000ML POUR BTL (IV SOLUTION) ×3 IMPLANT
PACK BASIN MINOR ARMC (MISCELLANEOUS) ×3 IMPLANT
SOL PREP PVP 2OZ (MISCELLANEOUS) ×6
SOLUTION PREP PVP 2OZ (MISCELLANEOUS) ×2 IMPLANT
SPONGE LAP 18X18 RF (DISPOSABLE) ×3 IMPLANT

## 2019-05-21 NOTE — Consult Note (Signed)
Pharmacy Antibiotic Note  [name] is a 54 y.o. male admitted on 05/19/2019 with cellulitis.  Pharmacy has been consulted for vancomycin dosing.  Plan: Patient received vancomycin loading dose of  1750 mg 1/27.  Continue Vancomycin  1250 mg q12H. Goal AUC 400-550. Expected AUC: 467 SCr used: 0.8  Plan to get levels 1/30 if patient is continued on vancomycin.   Height: 5\' 9"  (175.3 cm) Weight: 169 lb 8.5 oz (76.9 kg) IBW/kg (Calculated) : 70.7  Temp (24hrs), Avg:98.4 F (36.9 C), Min:97.5 F (36.4 C), Max:99 F (37.2 C)  Recent Labs  Lab 05/19/19 1542 05/19/19 1725 05/19/19 1916 05/19/19 2202 05/20/19 0453 05/21/19 0513  WBC 13.9*  --   --  10.7* 10.4 8.6  CREATININE 0.69  --  0.65  --  0.73 0.73  LATICACIDVEN 1.8 1.6  --   --   --   --     Estimated Creatinine Clearance: 109.2 mL/min (by C-G formula based on SCr of 0.73 mg/dL).    Allergies  Allergen Reactions  . Other     Dust mites and roaches     Antimicrobials this admission: 1/27 vancomycin >>  1/27 ceftriaxone  >> 1/28  Dose adjustments this admission: none  Thank you for allowing pharmacy to be a part of this patient's care.  Pernell Dupre, PharmD, BCPS 05/21/2019 8:55 AM

## 2019-05-21 NOTE — Progress Notes (Signed)
Pt states he has been having a hard time sleeping and request sleeping aid MD paged awaiting response.

## 2019-05-21 NOTE — Anesthesia Procedure Notes (Signed)
Procedure Name: LMA Insertion Date/Time: 05/21/2019 4:57 PM Performed by: Caryl Asp, CRNA Pre-anesthesia Checklist: Patient identified, Patient being monitored, Timeout performed, Emergency Drugs available and Suction available Patient Re-evaluated:Patient Re-evaluated prior to induction Oxygen Delivery Method: Circle system utilized Preoxygenation: Pre-oxygenation with 100% oxygen Induction Type: IV induction Ventilation: Mask ventilation without difficulty LMA: LMA inserted LMA Size: 4.0 Tube type: Oral Number of attempts: 1 Placement Confirmation: positive ETCO2 and breath sounds checked- equal and bilateral Tube secured with: Tape Dental Injury: Teeth and Oropharynx as per pre-operative assessment

## 2019-05-21 NOTE — Progress Notes (Signed)
Plan of care discussed with patient. PRN sleep aid ordered
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
## 2019-05-21 NOTE — Op Note (Addendum)
ATTENDING Surgeon(s): Herbert Pun, MD   ANESTHESIA: General   PRE-OPERATIVE DIAGNOSIS: Unstageable Left Leg Ulcer   POST-OPERATIVE DIAGNOSIS: Stage three left leg ulcer   PROCEDURE(S):  1.) Sharp excisional debridement of left leg ulcer down to tendon    INTRAOPERATIVE FINDINGS:  A 10 cm x 7 cm and 0.5 cm deep stage 3 left leg ulcer with purulent secretions   ESTIMATED BLOOD LOSS: Minimal (<10 mL)    SPECIMENS: left leg ulcer necrotic tissue   COMPLICATIONS: None apparent   CONDITION AT END OF PROCEDURE: Hemodynamically stable and awake   INDICATIONS FOR PROCEDURE:  Patient is 54 y.o. male with left leg ulcer with purulence and cellulitis that is unstageable. Patient with leukocytosis and purulent secretions from left leg wound.      DETAILS OF PROCEDURE: After informed consent, patient was taken to the OR. Time out performed. General anesthesia induced. Patient placed on supine position. The left leg was cleaned and draped in sterile fashion. With #15 blade and scissors, necrotic tissue from the left leg area was resected. Ischemic fat tissue and tendon were debrided down to the tendons anterior to tibial bone. Hemostasis achieved. Aquacel Ag dressing was applied. Patient tolerated the procedure well.   Pre debridement   Post debridement

## 2019-05-21 NOTE — Transfer of Care (Signed)
Immediate Anesthesia Transfer of Care Note  Patient: [name]  Procedure(s) Performed: IRRIGATION AND DEBRIDEMENT LEFT LEG (Left )  Patient Location: PACU  Anesthesia Type:General  Level of Consciousness: drowsy  Airway & Oxygen Therapy: Patient Spontanous Breathing and Patient connected to face mask oxygen  Post-op Assessment: Report given to RN and Post -op Vital signs reviewed and stable  Post vital signs: Reviewed and stable  Last Vitals:  Vitals Value Taken Time  BP 101/68 05/21/19 1735  Temp    Pulse 68 05/21/19 1737  Resp 14 05/21/19 1737  SpO2 98 % 05/21/19 1737  Vitals shown include unvalidated device data.  Last Pain:  Vitals:   05/21/19 1551  TempSrc: Oral  PainSc: 2       Patients Stated Pain Goal: 4 ([phone] [id number])  Complications: No apparent anesthesia complications

## 2019-05-21 NOTE — Progress Notes (Signed)
PROGRESS NOTE    [name]  [id number] DOB: [birth year] DOA: 05/19/2019 PCP: Justin Bane, NP      Brief Narrative:  [name] is a 54 y.o. M with HIV, COPD and HTN and daily EtOH use who presented with about a week of right lower extremity redness, swelling and pain around a shin ulcer.  In the ER, HR 95, WBC 13K, lactate normal.  Started on IV antibiotics.       Assessment & Plan:  Cellulitis Purulent cellulitis, with drainage, suspect staph, maybe MRSA.  MRSA nares negative. Culture pending.  Tib/fibradiograph iwthout bony change.  Seems to have a fluctuant component subcutaneously.  Doppler US leg ruled out DVT, which didn't appear clinically likely.  Afebrile, swelling/redness/pain improved -Continue vancomycin -Consult Surgery re: I&D --> to OR today   HIV -Continue Biktarvy -Follow  Pending CD4 count  Alcohol withdrawal Alcohol use disorder moderate Symptoms much improved.  CIWA <5 overnight -Continue frequent CIWA scoring -Continue on demand lorazepam -Continue thiamine and folate -Continue gabapentin 300 TID  COPD No active bronchospasm.    Not on O2 or maintenance inhalers at home. -Continue loratadine -Smoking cessation recommended  Hypertension BP controlled -Continue lisinopril  Mood disorder -Continue escitalopram, mirtazapine       Disposition: The patient was admitted with a purulent cellulitis of the right shin.  This will undergo incision and drainage today, and I believe it requires ongoing IV antibiotics in the setting of his immunosuppression chronic HIV infection.     I will discharge when he has completed incision and drainage, his infection has started to improve, and he is able to pack the wound himself.        MDM: The below labs and imaging reports reviewed and summarized above.  Medication management as above.      DVT prophylaxis: Lovenox Code Status: FULL Family Communication:     Consultants:    General surgery  Procedures:   1/28 US doppler no clot  1/29 I&D pending  Antimicrobials:   Vancomycin 1/27 >>  Ceftriaxone 1/27   Culture data:   1/27 Superficial culture left leg  -- pending          Subjective: Patient's redness is improved, swelling is better.  He has no fever, vomiting, confusion.  His withdrawal symptoms are considerably better.     Objective: Vitals:   05/20/19 2338 05/21/19 0500 05/21/19 0724 05/21/19 1154  BP: 140/75  (!) 147/88 122/87  Pulse: 81  73 74  Resp: 18  16 18   Temp: 98.7 F (37.1 C)  99 F (37.2 C) 98.2 F (36.8 C)  TempSrc: Oral  Oral Oral  SpO2: 99%  98% 100%  Weight:  76.9 kg    Height:       No intake or output data in the 24 hours ending 05/21/19 1331 Filed Weights   05/19/19 1442 05/21/19 0500  Weight: 74.4 kg 76.9 kg    Examination: General appearance:  adult male, alert and in no acute distress.   HEENT: Anicteric, conjunctiva pink, lids and lashes normal. No nasal deformity, discharge, epistaxis.  Lips moist, teeth normal. OP normal, no oral lesions.   Skin: Warm and dry.  He has numerous ovoid excoriations and scabs.  On the right shin there is improved redness but still substantial with a subcutaneous edema, and to a new spot of purulent drainage on the anterior shin with some swelling and fluctuance. Cardiac: RRR, no murmurs appreciated.  No LE edema lower than  around the infection.    Respiratory: Normal respiratory rate and rhythm.  CTAB without rales or wheezes. Abdomen: Abdomen soft.  No tenderness to palpation or guarding. No ascites, distension, hepatosplenomegaly.   MSK: No deformities or effusions of the large joints of the upper or lower extremities bilaterally. Neuro: Awake and alert. Naming is grossly intact, and the patient's recall, recent and remote, as well as general fund of knowledge seem within normal limits.  Muscle tone normal, without fasciculations.  Moves all extremities equally and  with normal coordination.  Speech fluent.    Psych: Sensorium intact and responding to questions, attention normal. Affect normal.  Judgment and insight appear normal.      Data Reviewed: I have personally reviewed following labs and imaging studies:  CBC: Recent Labs  Lab 05/19/19 1542 05/19/19 2202 05/20/19 0453 05/21/19 0513  WBC 13.9* 10.7* 10.4 8.6  NEUTROABS 10.0*  --   --   --   HGB 13.1 12.9* 12.1* 12.4*  HCT 36.7* 36.0* 34.4* 35.9*  MCV 96.8 97.0 98.0 99.2  PLT 218 213 208 [id number]   Basic Metabolic Panel: Recent Labs  Lab 05/19/19 1542 05/19/19 1916 05/20/19 0453 05/21/19 0513  NA 137 134* 135 140  K 3.1* 3.2* 3.6 3.8  CL 102 100 100 108  CO2 25 23 25 25   GLUCOSE 89 147* 85 95  BUN 10 8 5* 8  CREATININE 0.69 0.65 0.73 0.73  CALCIUM 8.6* 8.0* 7.9* 8.1*  MG  --   --  2.0  --    GFR: Estimated Creatinine Clearance: 109.2 mL/min (by C-G formula based on SCr of 0.73 mg/dL). Liver Function Tests: Recent Labs  Lab 05/19/19 1542 05/19/19 1916 05/21/19 0513  AST 34 29 17  ALT 18 17 12   ALKPHOS 68 58 56  BILITOT 0.5 0.8 0.9  PROT 7.0 6.5 6.6  ALBUMIN 3.2* 3.0* 2.8*   No results for input(s): LIPASE, AMYLASE in the last 168 hours. No results for input(s): AMMONIA in the last 168 hours. Coagulation Profile: No results for input(s): INR, PROTIME in the last 168 hours. Cardiac Enzymes: No results for input(s): CKTOTAL, CKMB, CKMBINDEX, TROPONINI in the last 168 hours. BNP (last 3 results) No results for input(s): PROBNP in the last 8760 hours. HbA1C: No results for input(s): HGBA1C in the last 72 hours. CBG: No results for input(s): GLUCAP in the last 168 hours. Lipid Profile: No results for input(s): CHOL, HDL, LDLCALC, TRIG, CHOLHDL, LDLDIRECT in the last 72 hours. Thyroid Function Tests: No results for input(s): TSH, T4TOTAL, FREET4, T3FREE, THYROIDAB in the last 72 hours. Anemia Panel: No results for input(s): VITAMINB12, FOLATE, FERRITIN, TIBC,  IRON, RETICCTPCT in the last 72 hours. Urine analysis:    Component Value Date/Time   APPEARANCEUR Clear 04/27/2018 0858   GLUCOSEU Negative 04/27/2018 0858   BILIRUBINUR Negative 04/27/2018 0858   PROTEINUR Negative 04/27/2018 0858   NITRITE Negative 04/27/2018 0858   LEUKOCYTESUR Negative 04/27/2018 0858   Sepsis Labs: @LABRCNTIP (procalcitonin:4,lacticacidven:4)  ) Recent Results (from the past 240 hour(s))  SARS CORONAVIRUS 2 (TAT 6-24 HRS) Nasopharyngeal Nasopharyngeal Swab     Status: None   Collection Time: 05/19/19  5:25 PM   Specimen: Nasopharyngeal Swab  Result Value Ref Range Status   SARS Coronavirus 2 NEGATIVE NEGATIVE Final    Comment: (NOTE) SARS-CoV-2 target nucleic acids are NOT DETECTED. The SARS-CoV-2 RNA is generally detectable in upper and lower respiratory specimens during the acute phase of infection. Negative results do not preclude SARS-CoV-2  infection, do not rule out co-infections with other pathogens, and should not be used as the sole basis for treatment or other patient management decisions. Negative results must be combined with clinical observations, patient history, and epidemiological information. The expected result is Negative. Fact Sheet for Patients: SugarRoll.be Fact Sheet for Healthcare Providers: https://www.woods-mathews.com/ This test is not yet approved or cleared by the [place] FDA and  has been authorized for detection and/or diagnosis of SARS-CoV-2 by FDA under an Emergency Use Authorization (EUA). This EUA will remain  in effect (meaning this test can be used) for the duration of the COVID-19 declaration under Section 56 4(b)(1) of the Act, 21 U.S.C. section 360bbb-3(b)(1), unless the authorization is terminated or revoked sooner. Performed at [facility], [place] [address] [place], [place] [address]   Aerobic Culture (superficial specimen)     Status: None (Preliminary  result)   Collection Time: 05/19/19  9:06 PM   Specimen: Leg; Wound  Result Value Ref Range Status   Specimen Description   Final    LEG Performed at [facility], [address], [place], [place] [address]    Special Requests   Final    Normal Performed at [facility], [address] [place], [place] [address]    Gram Stain PENDING  Incomplete   Culture FEW STREPTOCOCCUS PYOGENES  Final   Report Status PENDING  Incomplete  Surgical PCR screen     Status: None   Collection Time: 05/21/19  4:02 AM   Specimen: Nasal Mucosa; Nasal Swab  Result Value Ref Range Status   MRSA, PCR NEGATIVE NEGATIVE Final   Staphylococcus aureus NEGATIVE NEGATIVE Final    Comment: (NOTE) The Xpert SA Assay (FDA approved for NASAL specimens in patients 10 years of age and older), is one component of a comprehensive surveillance program. It is not intended to diagnose infection nor to guide or monitor treatment. Performed at [facility], [address], [place], [place] [address]          Radiology Studies: DG Tibia/Fibula Left  Result Date: 05/19/2019 CLINICAL DATA:  Anterior lower leg wound with purulent drainage. EXAM: LEFT TIBIA AND FIBULA - 2 VIEW COMPARISON:  None. FINDINGS: There is no evidence of fracture or other focal bone lesions. No bony destruction or periosteal reaction. Superficial ulceration over the anterior mid lower leg. Diffuse soft tissue swelling. IMPRESSION: 1. Superficial ulceration over the anterior mid lower leg with diffuse soft tissue swelling. No acute osseous abnormality. Electronically Signed   By: Titus Dubin M.D.   On: 05/19/2019 17:54   US Venous Img Lower Unilateral Left (DVT)  Result Date: 05/20/2019 CLINICAL DATA:  Left lower extremity skin infection with swelling and redness, initial encounter EXAM: LEFT LOWER EXTREMITY VENOUS DOPPLER ULTRASOUND TECHNIQUE: Gray-scale sonography with compression, as well as color and duplex  ultrasound, were performed to evaluate the deep venous system(s) from the level of the common femoral vein through the popliteal and proximal calf veins. COMPARISON:  None. FINDINGS: VENOUS Normal compressibility of the common femoral, superficial femoral, and popliteal veins, as well as the visualized calf veins. Visualized portions of profunda femoral vein and great saphenous vein unremarkable. No filling defects to suggest DVT on grayscale or color Doppler imaging. Doppler waveforms show normal direction of venous flow, normal respiratory phasicity and response to augmentation. Limited views of the contralateral common femoral vein are unremarkable. OTHER None. Limitations: none IMPRESSION: No femoropopliteal DVT nor evidence of DVT within the visualized  calf veins. Electronically Signed   By: Inez Catalina M.D.   On: 05/20/2019 11:46        Scheduled Meds: . bictegravir-emtricitabine-tenofovir AF  1 tablet Oral Daily  . enoxaparin (LOVENOX) injection  40 mg Subcutaneous Q24H  . escitalopram  20 mg Oral QHS  . feeding supplement (PRO-STAT SUGAR FREE 64)  30 mL Oral BID  . fluticasone  1 spray Each Nare Daily  . folic acid  1 mg Oral Daily  . gabapentin  300 mg Oral TID  . lisinopril  10 mg Oral Daily  . loratadine  10 mg Oral Daily  . mirtazapine  30 mg Oral QHS  . multivitamin with minerals  1 tablet Oral Daily  . thiamine  100 mg Oral Daily   Or  . thiamine  100 mg Intravenous Daily   Continuous Infusions: . vancomycin 1,250 mg (05/21/19 0232)     LOS: 2 days    Time spent: 25 minutes    Edwin Dada, MD [facility] 05/21/2019, 1:31 PM     Please page though [place] or Epic secure chat:  For [employer], Adult nurse

## 2019-05-21 NOTE — Clinical Social Work Note (Signed)
CSW acknowledges consult for substance use resources. Patient's family currently in room. RN will notify CSW when they leave so CSW can provide list if agreeable.  Dayton Scrape, [place]

## 2019-05-21 NOTE — TOC Initial Note (Signed)
Transition of Care [facility]) - Initial/Assessment Note    Patient Details  Name: [name] MRN: [id number] Date of Birth: Oct 21, 1965  [facility] CM/SW Contact:    Candie Chroman, LCSW Phone Number: 05/21/2019, 12:13 PM  Clinical Narrative: CSW met with patient. No supports at bedside. Patient was up in room making his bed. CSW introduced role and inquired about interest in substance abuse resources. Patient agreeable. Provided resources for outpatient services in [place]. No further concerns. CSW signing off.                 Expected Discharge Plan: Home/Self Care Barriers to Discharge: Continued Medical Work up   Patient Goals and CMS Choice     Choice offered to / list presented to : NA  Expected Discharge Plan and Services Expected Discharge Plan: Home/Self Care     Post Acute Care Choice: NA Living arrangements for the past 2 months: Single Family Home                                      Prior Living Arrangements/Services Living arrangements for the past 2 months: Single Family Home   Patient language and need for interpreter reviewed:: Yes Do you feel safe going back to the place where you live?: Yes      Need for Family Participation in Patient Care: No (Comment) Care giver support system in place?: No (comment)   Criminal Activity/Legal Involvement Pertinent to Current Situation/Hospitalization: No - Comment as needed  Activities of Daily Living Home Assistive Devices/Equipment: None ADL Screening (condition at time of admission) Patient's cognitive ability adequate to safely complete daily activities?: Yes Is the patient deaf or have difficulty hearing?: No Does the patient have difficulty seeing, even when wearing glasses/contacts?: No Does the patient have difficulty concentrating, remembering, or making decisions?: No Patient able to express need for assistance with ADLs?: Yes Does the patient have difficulty dressing or  bathing?: No Independently performs ADLs?: Yes (appropriate for developmental age) Does the patient have difficulty walking or climbing stairs?: No Weakness of Legs: Left Weakness of Arms/Hands: None  Permission Sought/Granted                  Emotional Assessment Appearance:: Appears stated age Attitude/Demeanor/Rapport: Engaged Affect (typically observed): Accepting, Appropriate, Calm Orientation: : Oriented to Self, Oriented to Place, Oriented to  Time, Oriented to Situation Alcohol / Substance Use: Alcohol Use Psych Involvement: No (comment)  Admission diagnosis:  Cellulitis [L03.90] Cellulitis of left lower extremity [L03.116] HIV infection, unspecified symptom status ([place]) [B20] Patient Active Problem List   Diagnosis Date Noted  . Cellulitis 05/19/2019  . Hypokalemia 05/19/2019  . Protein-calorie malnutrition, moderate ([place]) 05/19/2019  . Special screening for malignant neoplasms, colon   . Polyp of transverse colon   . HIV (human immunodeficiency virus infection) ([place]) 03/03/2018  . Acute bronchitis with COPD ([place]) 03/03/2018   PCP:  Kendell Bane, NP Pharmacy:   [id number] [address]Phone: [phone]: [phone]    Social Determinants of Health (SDOH) Interventions    Readmission Risk Interventions No flowsheet data found.

## 2019-05-22 DIAGNOSIS — E440 Moderate protein-calorie malnutrition: Secondary | ICD-10-CM

## 2019-05-22 LAB — BASIC METABOLIC PANEL
Anion gap: 6 (ref 5–15)
BUN: 12 mg/dL (ref 6–20)
CO2: 26 mmol/L (ref 22–32)
Calcium: 8.4 mg/dL — ABNORMAL LOW (ref 8.9–10.3)
Chloride: 103 mmol/L (ref 98–111)
Creatinine, Ser: 0.71 mg/dL (ref 0.61–1.24)
GFR calc Af Amer: >60 mL/min (ref >60)
GFR calc non Af Amer: >60 mL/min (ref >60)
Glucose, Bld: 140 mg/dL — ABNORMAL HIGH (ref 70–99)
Potassium: 4.4 mmol/L (ref 3.5–5.1)
Sodium: 135 mmol/L (ref 135–145)

## 2019-05-22 LAB — CBC
HCT: 37.2 % — ABNORMAL LOW (ref 39.0–52.0)
Hemoglobin: 12.8 g/dL — ABNORMAL LOW (ref 13.0–17.0)
MCH: 34.8 pg — ABNORMAL HIGH (ref 26.0–34.0)
MCHC: 34.4 g/dL (ref 30.0–36.0)
MCV: 101.1 fL — ABNORMAL HIGH (ref 80.0–100.0)
Platelets: 248 10*3/uL (ref 150–400)
RBC: 3.68 MIL/uL — ABNORMAL LOW (ref 4.22–5.81)
RDW: 13.1 % (ref 11.5–15.5)
WBC: 10.4 10*3/uL (ref 4.0–10.5)
nRBC: 0 % (ref 0.0–0.2)

## 2019-05-22 LAB — AEROBIC CULTURE W GRAM STAIN (SUPERFICIAL SPECIMEN): Special Requests: NORMAL

## 2019-05-22 LAB — MAGNESIUM: Magnesium: 2.3 mg/dL (ref 1.7–2.4)

## 2019-05-22 MED ORDER — SULFAMETHOXAZOLE-TRIMETHOPRIM 800-160 MG PO TABS: 1.0000 | ORAL_TABLET | Freq: Two times a day (BID) | ORAL | 0 refills | Status: DC

## 2019-05-22 MED ORDER — CEPHALEXIN 500 MG PO CAPS: 500.0000 mg | ORAL_CAPSULE | Freq: Three times a day (TID) | ORAL | 0 refills | Status: AC

## 2019-05-22 MED ORDER — "AQUACEL AG FOAM 3.2""X3.2"" EX PADS": 1.0000 | MEDICATED_PAD | Freq: Every day | CUTANEOUS | 0 refills | Status: DC

## 2019-05-22 MED ORDER — GABAPENTIN 300 MG PO CAPS: 300.0000 mg | ORAL_CAPSULE | Freq: Three times a day (TID) | ORAL | 0 refills | Status: DC

## 2019-05-22 NOTE — Discharge Summary (Addendum)
Physician Discharge Summary  Yordi Pankratz [id number] DOB: Jun 22, 1965 DOA: 05/19/2019  PCP: Kendell Bane, NP  Admit date: 05/19/2019 Discharge date: 05/22/2019  Admitted From: Home  Disposition:  Home   Recommendations for Outpatient Follow-up:  1. Follow up with Dr. Windell Moment in 4-7 days 2. Follow up with PCP Orson Gear in 1-2 weeks 3. Orson Gear: Please refill gabapentin for alcohol withdrawal for 1-2 more months, taper at end of 2-3 months total --> in meantime, please refer patient for naltrexone or other EtOH abstinence therapy, if desired 4. Dr. Windell Moment: If patient's operative culture NOT growing MRSA, would discontinue Bactrim and treat with cephalexin alone      Home Health: None  Equipment/Devices: None  Discharge Condition: Good  CODE STATUS: FULL Diet recommendation: Normal  Brief/Interim Summary: Mr. Barriere is a 54 y.o. M with HIV well-controlled, COPD and HTN and daily EtOH use who presented with about a week of right lower extremity redness, swelling and pain around a shin ulcer.  In the ER, HR 95, WBC 13K, lactate normal.  Started on IV antibiotics.        PRINCIPAL HOSPITAL DIAGNOSIS: Cellulitis    Discharge Diagnoses:   Cellulitis Purulent cellulitis, with drainage.  Started on empiric vancomycin.  Tib/fib radiograph iwthout bony change.  Swelling improved, and there was mild fluctuance and necrotic tissue.  General surgery was consulted who took the patient to the OR on 1/29 and debrided a small amount of necrotic tissue, subcutaneous fat and muscle.  Postoperatively, the patient's pain improved dramatically, his swelling mostly resolved, Doppler US leg ruled out DVT, and he was comfortable for discharge.  Discharge on Bactrim and Keflex.  If culture matures without MRSA, with narrowed to Keflex alone for total of 10 days or as deemed necessary by Surgery.    HIV CD4 count >600.  Continue Biktarvy   Alcohol  withdrawal Alcohol use disorder moderate Patient endorsed greater than 16 and alcohol drinks per day.  On admission, he had shakes, malaise, irritability, anxiety, sweats.  He was treated with thiamine and folate, started on high-dose gabapentin, and given 2 doses of oral lorazepam, and his symptoms improved.  Continue gabapentin 300 3 times daily for 1 to 3 months, then taper off.  This is for subacute symptoms of withdrawal.  He was counseled to seek alcohol treatment services.   COPD No active bronchospasm.    Not on O2 or maintenance inhalers at home.  Smoking cessation recommended  Hypertension Continue lisinopril  Mood disorder Continue escitalopram, mirtazapine  Moderate protein calorie malnutrition As evidenced by somewhat diminished muscle mass and fat, reportedly inadequate oral intake.        Discharge Instructions  Discharge Instructions    Discharge instructions   Complete by: As directed    From Dr. Loleta Books: You were admitted with a skin infection called "cellulitis".  This required some cleaning out in the operating room.    The culture from that operation is still pending.  You should take the following antibiotics: Take cephalexin/Keflex 500 mg three times daily with food until gone (about 10 days) Take sulfamethoxazole-trimethoprim/Bactrim DS 800-125 twice daily for 1 week  If you have new fever, rash, or mouth sores, stop Bactrim immediately and call Dr. Deniece Ree office to be seen or go to the ER  If you call Dr. Deniece Ree office in 3 days, ask them if they can review the culture from your surgery, you may be able to stop the Bactrim  Pack the wound as  you were instructed  See Dr. Peyton Najjar later this week   See your primary care doctor Adam in 1-2 weeks Discuss the Gabapentin with him.  It is okay to taper off of this after 1 month, or for him to continue the prescription for 2-3 months total.  It is not intended to be a permanent medicine.  It  is for alcohol withdrawal.   Increase activity slowly   Complete by: As directed      Allergies as of 05/22/2019      Reactions   Other    Dust mites and roaches       Medication List    TAKE these medications   albuterol 108 (90 Base) MCG/ACT inhaler Commonly known as: VENTOLIN HFA Inhale 1-2 puffs into the lungs every 6 (six) hours as needed for wheezing or shortness of breath.   Aquacel Ag Foam 3.2"X3.2" Pads Apply 1 Film topically daily.   bictegravir-emtricitabine-tenofovir AF 50-200-25 MG Tabs tablet Commonly known as: BIKTARVY Take 1 tablet by mouth daily.   cephALEXin 500 MG capsule Commonly known as: KEFLEX Take 1 capsule (500 mg total) by mouth 3 (three) times daily for 10 days.   cetirizine 10 MG tablet Commonly known as: ZYRTEC Take 10 mg by mouth daily.   escitalopram 20 MG tablet Commonly known as: LEXAPRO TAKE 1 TABLET BY MOUTH DAILY WITH SUPPER   fluticasone 50 MCG/ACT nasal spray Commonly known as: FLONASE Place 1 spray into both nostrils daily.   gabapentin 300 MG capsule Commonly known as: NEURONTIN Take 1 capsule (300 mg total) by mouth 3 (three) times daily.   lisinopril 10 MG tablet Commonly known as: ZESTRIL Take 1 tablet (10 mg total) by mouth daily.   mirtazapine 30 MG tablet Commonly known as: REMERON TAKE 1 TABLET BY MOUTH AT BEDTIME.   sulfamethoxazole-trimethoprim 800-160 MG tablet Commonly known as: BACTRIM DS Take 1 tablet by mouth 2 (two) times daily.   valACYclovir 1000 MG tablet Commonly known as: VALTREX Take 1 tablet (1,000 mg total) by mouth 2 (two) times daily. What changed:   when to take this  reasons to take this      Follow-up Information    Herbert Pun, MD Follow up in 3 day(s).   Specialty: General Surgery Contact information: [place] [address] [phone]          Allergies  Allergen Reactions  . Other     Dust mites and roaches      Consultations:  General Surgery   Procedures/Studies: DG Tibia/Fibula Left  Result Date: 05/19/2019 CLINICAL DATA:  Anterior lower leg wound with purulent drainage. EXAM: LEFT TIBIA AND FIBULA - 2 VIEW COMPARISON:  None. FINDINGS: There is no evidence of fracture or other focal bone lesions. No bony destruction or periosteal reaction. Superficial ulceration over the anterior mid lower leg. Diffuse soft tissue swelling. IMPRESSION: 1. Superficial ulceration over the anterior mid lower leg with diffuse soft tissue swelling. No acute osseous abnormality. Electronically Signed   By: Titus Dubin M.D.   On: 05/19/2019 17:54   US Venous Img Lower Unilateral Left (DVT)  Result Date: 05/20/2019 CLINICAL DATA:  Left lower extremity skin infection with swelling and redness, initial encounter EXAM: LEFT LOWER EXTREMITY VENOUS DOPPLER ULTRASOUND TECHNIQUE: Gray-scale sonography with compression, as well as color and duplex ultrasound, were performed to evaluate the deep venous system(s) from the level of the common femoral vein through the popliteal and proximal calf veins. COMPARISON:  None. FINDINGS:  VENOUS Normal compressibility of the common femoral, superficial femoral, and popliteal veins, as well as the visualized calf veins. Visualized portions of profunda femoral vein and great saphenous vein unremarkable. No filling defects to suggest DVT on grayscale or color Doppler imaging. Doppler waveforms show normal direction of venous flow, normal respiratory phasicity and response to augmentation. Limited views of the contralateral common femoral vein are unremarkable. OTHER None. Limitations: none IMPRESSION: No femoropopliteal DVT nor evidence of DVT within the visualized calf veins. Electronically Signed   By: Inez Catalina M.D.   On: 05/20/2019 11:46       Subjective: Patient's pain is much better.  The swelling is mostly resolved.  He still has some surrounding redness around the anterior shin  wound, but this is quite a bit better.  No fever, confusion, vomiting.  No more shakes, headache, sweats.  Discharge Exam: Vitals:   05/22/19 0114 05/22/19 0741  BP: 116/68 138/88  Pulse: 65 73  Resp:  20  Temp: 97.8 F (36.6 C) 97.9 F (36.6 C)  SpO2: 98% 100%   Vitals:   05/21/19 1829 05/21/19 1859 05/22/19 0114 05/22/19 0741  BP: (!) 151/90 (!) 133/91 116/68 138/88  Pulse: 71 77 65 73  Resp: 14 16  20   Temp:   97.8 F (36.6 C) 97.9 F (36.6 C)  TempSrc:   Oral Oral  SpO2: 100% 100% 98% 100%  Weight:      Height:        General: Pt is alert, awake, not in acute distress, mobile in the room. Cardiovascular: RRR, nl S1-S2, no murmurs appreciated.   No LE edema.   Respiratory: Normal respiratory rate and rhythm.  CTAB without rales or wheezes. Abdominal: Abdomen soft and non-tender.  No distension or HSM.   Neuro/Psych: Strength symmetric in upper and lower extremities.  Judgment and insight appear normal.   The results of significant diagnostics from this hospitalization (including imaging, microbiology, ancillary and laboratory) are listed below for reference.     Microbiology: Recent Results (from the past 240 hour(s))  SARS CORONAVIRUS 2 (TAT 6-24 HRS) Nasopharyngeal Nasopharyngeal Swab     Status: None   Collection Time: 05/19/19  5:25 PM   Specimen: Nasopharyngeal Swab  Result Value Ref Range Status   SARS Coronavirus 2 NEGATIVE NEGATIVE Final    Comment: (NOTE) SARS-CoV-2 target nucleic acids are NOT DETECTED. The SARS-CoV-2 RNA is generally detectable in upper and lower respiratory specimens during the acute phase of infection. Negative results do not preclude SARS-CoV-2 infection, do not rule out co-infections with other pathogens, and should not be used as the sole basis for treatment or other patient management decisions. Negative results must be combined with clinical observations, patient history, and epidemiological information. The expected result is  Negative. Fact Sheet for Patients: SugarRoll.be Fact Sheet for Healthcare Providers: https://www.woods-mathews.com/ This test is not yet approved or cleared by the [place] FDA and  has been authorized for detection and/or diagnosis of SARS-CoV-2 by FDA under an Emergency Use Authorization (EUA). This EUA will remain  in effect (meaning this test can be used) for the duration of the COVID-19 declaration under Section 56 4(b)(1) of the Act, 21 U.S.C. section 360bbb-3(b)(1), unless the authorization is terminated or revoked sooner. Performed at [facility], [place] [address], [place], [place] [address]   Aerobic Culture (superficial specimen)     Status: None   Collection Time: 05/19/19  9:06 PM   Specimen: Leg; Wound  Result Value Ref  Range Status   Specimen Description   Final    LEG Performed at [facility], [address] [place], [place] [address]    Special Requests   Final    Normal Performed at [facility], [address] [place], [place] [address]    Gram Stain   Final    ABUNDANT WBC PRESENT,BOTH PMN AND MONONUCLEAR MODERATE GRAM POSITIVE COCCI    Culture   Final    FEW GROUP A STREP (S.PYOGENES) ISOLATED Beta hemolytic streptococci are predictably susceptible to penicillin and other beta lactams. Susceptibility testing not routinely performed. Performed at [facility], [place] [address], [place], [place] [address]    Report Status 05/22/2019 FINAL  Final  Surgical PCR screen     Status: None   Collection Time: 05/21/19  4:02 AM   Specimen: Nasal Mucosa; Nasal Swab  Result Value Ref Range Status   MRSA, PCR NEGATIVE NEGATIVE Final   Staphylococcus aureus NEGATIVE NEGATIVE Final    Comment: (NOTE) The Xpert SA Assay (FDA approved for NASAL specimens in patients 61 years of age and older), is one component of a comprehensive surveillance program. It is not intended to diagnose infection  nor to guide or monitor treatment. Performed at [facility], [address] [place], [place] [address]   Aerobic/Anaerobic Culture (surgical/deep wound)     Status: None (Preliminary result)   Collection Time: 05/21/19  5:03 PM   Specimen: Wound; Tissue  Result Value Ref Range Status   Specimen Description   Final    TISSUE LEFT LEG Performed at [facility], [place] [address] [place], [place] [address]    Special Requests   Final    NONE Performed at [facility], [address] [place], [place] [address]    Gram Stain   Final    ABUNDANT WBC PRESENT, PREDOMINANTLY PMN MODERATE GRAM POSITIVE COCCI    Culture   Final    CULTURE REINCUBATED FOR BETTER GROWTH Performed at [facility], [place] [address] [place], [place] [address]    Report Status PENDING  Incomplete     Labs: BNP (last 3 results) No results for input(s): BNP in the last 8760 hours. Basic Metabolic Panel: Recent Labs  Lab 05/19/19 1542 05/19/19 1916 05/20/19 0453 05/21/19 0513 05/22/19 0420  NA 137 134* 135 140 135  K 3.1* 3.2* 3.6 3.8 4.4  CL 102 100 100 108 103  CO2 25 23 25 25 26   GLUCOSE 89 147* 85 95 140*  BUN 10 8 5* 8 12  CREATININE 0.69 0.65 0.73 0.73 0.71  CALCIUM 8.6* 8.0* 7.9* 8.1* 8.4*  MG  --   --  2.0  --  2.3   Liver Function Tests: Recent Labs  Lab 05/19/19 1542 05/19/19 1916 05/21/19 0513  AST 34 29 17  ALT 18 17 12   ALKPHOS 68 58 56  BILITOT 0.5 0.8 0.9  PROT 7.0 6.5 6.6  ALBUMIN 3.2* 3.0* 2.8*   No results for input(s): LIPASE, AMYLASE in the last 168 hours. No results for input(s): AMMONIA in the last 168 hours. CBC: Recent Labs  Lab 05/19/19 1542 05/19/19 1542 05/19/19 2202 05/20/19 0453 05/20/19 0953 05/21/19 0513 05/22/19 0420  WBC 13.9*   < > 10.7* 10.4 10.0 8.6 10.4  NEUTROABS 10.0*  --   --   --  7.0  --   --   HGB 13.1   < > 12.9* 12.1* 12.4* 12.4* 12.8*  HCT 36.7*   < > 36.0* 34.4* 34.3* 35.9* 37.2*  MCV 96.8   < >  97.0 98.0 99* 99.2 101.1*  PLT 218   < > 213 208 220 214 248   < > = values in this interval not displayed.   Cardiac Enzymes: No results for input(s): CKTOTAL, CKMB, CKMBINDEX, TROPONINI in the last 168 hours. BNP: Invalid input(s): POCBNP CBG: No results for input(s): GLUCAP in the last 168 hours. D-Dimer No results for input(s): DDIMER in the last 72 hours. Hgb A1c No results for input(s): HGBA1C in the last 72 hours. Lipid Profile No results for input(s): CHOL, HDL, LDLCALC, TRIG, CHOLHDL, LDLDIRECT in the last 72 hours. Thyroid function studies No results for input(s): TSH, T4TOTAL, T3FREE, THYROIDAB in the last 72 hours.  Invalid input(s): FREET3 Anemia work up No results for input(s): VITAMINB12, FOLATE, FERRITIN, TIBC, IRON, RETICCTPCT in the last 72 hours. Urinalysis    Component Value Date/Time   APPEARANCEUR Clear 04/27/2018 0858   GLUCOSEU Negative 04/27/2018 0858   BILIRUBINUR Negative 04/27/2018 0858   PROTEINUR Negative 04/27/2018 0858   NITRITE Negative 04/27/2018 0858   LEUKOCYTESUR Negative 04/27/2018 0858   Sepsis Labs Invalid input(s): PROCALCITONIN,  WBC,  LACTICIDVEN Microbiology Recent Results (from the past 240 hour(s))  SARS CORONAVIRUS 2 (TAT 6-24 HRS) Nasopharyngeal Nasopharyngeal Swab     Status: None   Collection Time: 05/19/19  5:25 PM   Specimen: Nasopharyngeal Swab  Result Value Ref Range Status   SARS Coronavirus 2 NEGATIVE NEGATIVE Final    Comment: (NOTE) SARS-CoV-2 target nucleic acids are NOT DETECTED. The SARS-CoV-2 RNA is generally detectable in upper and lower respiratory specimens during the acute phase of infection. Negative results do not preclude SARS-CoV-2 infection, do not rule out co-infections with other pathogens, and should not be used as the sole basis for treatment or other patient management decisions. Negative results must be combined with clinical observations, patient history, and epidemiological information. The  expected result is Negative. Fact Sheet for Patients: SugarRoll.be Fact Sheet for Healthcare Providers: https://www.woods-mathews.com/ This test is not yet approved or cleared by the [place] FDA and  has been authorized for detection and/or diagnosis of SARS-CoV-2 by FDA under an Emergency Use Authorization (EUA). This EUA will remain  in effect (meaning this test can be used) for the duration of the COVID-19 declaration under Section 56 4(b)(1) of the Act, 21 U.S.C. section 360bbb-3(b)(1), unless the authorization is terminated or revoked sooner. Performed at [facility], [place] [address], [place], [place] [address]   Aerobic Culture (superficial specimen)     Status: None   Collection Time: 05/19/19  9:06 PM   Specimen: Leg; Wound  Result Value Ref Range Status   Specimen Description   Final    LEG Performed at [facility], [address] [place], [place] [address]    Special Requests   Final    Normal Performed at [facility], [address] [place], [place] [address]    Gram Stain   Final    ABUNDANT WBC PRESENT,BOTH PMN AND MONONUCLEAR MODERATE GRAM POSITIVE COCCI    Culture   Final    FEW GROUP A STREP (S.PYOGENES) ISOLATED Beta hemolytic streptococci are predictably susceptible to penicillin and other beta lactams. Susceptibility testing not routinely performed. Performed at [facility], [place] [address], [place], [place] [address]    Report Status 05/22/2019 FINAL  Final  Surgical PCR screen     Status:  None   Collection Time: 05/21/19  4:02 AM   Specimen: Nasal Mucosa; Nasal Swab  Result Value Ref Range Status   MRSA, PCR NEGATIVE NEGATIVE Final   Staphylococcus aureus NEGATIVE NEGATIVE Final    Comment: (NOTE) The Xpert SA Assay (FDA approved for NASAL specimens in patients 40 years of age and older), is one component of a comprehensive surveillance program. It is not intended to  diagnose infection nor to guide or monitor treatment. Performed at [facility], [address] [place], [place] [address]   Aerobic/Anaerobic Culture (surgical/deep wound)     Status: None (Preliminary result)   Collection Time: 05/21/19  5:03 PM   Specimen: Wound; Tissue  Result Value Ref Range Status   Specimen Description   Final    TISSUE LEFT LEG Performed at [facility], [place] [address] [place], [place] [address]    Special Requests   Final    NONE Performed at [facility], [address] [place], [place] [address]    Gram Stain   Final    ABUNDANT WBC PRESENT, PREDOMINANTLY PMN MODERATE GRAM POSITIVE COCCI    Culture   Final    CULTURE REINCUBATED FOR BETTER GROWTH Performed at [facility], [place] [address], [name], [place] [address]    Report Status PENDING  Incomplete     Time coordinating discharge: 35 minutes The  controlled substances registry was reviewed for this patient.      SIGNED:   Edwin Dada, MD  [facility] 05/22/2019, 1:57 PM

## 2019-05-22 NOTE — Progress Notes (Signed)
Patient discharged to home with self. Patient verbalized understanding of discharge orders. AVS given. Patient exited with steady gait.

## 2019-05-22 NOTE — Anesthesia Postprocedure Evaluation (Signed)
Anesthesia Post Note  Patient: [name]  Procedure(s) Performed: IRRIGATION AND DEBRIDEMENT LEFT LEG (Left )  Patient location during evaluation: PACU Anesthesia Type: General Level of consciousness: awake and alert Pain management: pain level controlled Vital Signs Assessment: post-procedure vital signs reviewed and stable Respiratory status: spontaneous breathing, nonlabored ventilation, respiratory function stable and patient connected to nasal cannula oxygen Cardiovascular status: blood pressure returned to baseline and stable Postop Assessment: no apparent nausea or vomiting Anesthetic complications: no     Last Vitals:  Vitals:   05/21/19 1859 05/22/19 0114  BP: (!) 133/91 116/68  Pulse: 77 65  Resp: 16   Temp:  36.6 C  SpO2: 100% 98%    Last Pain:  Vitals:   05/22/19 0114  TempSrc: Oral  PainSc:                  [name]

## 2019-05-22 NOTE — Progress Notes (Signed)
[facility] Hospital Day(s): 3.   Post op day(s): 1 Day Post-Op.   Interval History: Patient seen and examined, no acute events or new complaints overnight. Patient reports feeling well. Pain controlled. Denies any problem with the wound. No pain radiation. No alleviating or aggravating factors.  Vital signs in last 24 hours: [min-max] current  Temp:  [97.7 F (36.5 C)-98.2 F (36.8 C)] 97.9 F (36.6 C) (01/30 0741) Pulse Rate:  [65-79] 73 (01/30 0741) Resp:  [14-20] 20 (01/30 0741) BP: (101-151)/(68-91) 138/88 (01/30 0741) SpO2:  [97 %-100 %] 100 % (01/30 0741) Weight:  [74.4 kg] 74.4 kg (01/29 1551)     Height: 5' 9.5" (176.5 cm) Weight: 74.4 kg BMI (Calculated): 23.88   Physical Exam:  Constitutional: alert, cooperative and no distress  Extremity: left leg with decreased cellulitis concentrating more and more around the ulcer. No purulent drainage. No necrotic tissue.   Labs:  CBC Latest Ref Rng & Units 05/22/2019 05/21/2019 05/20/2019  WBC 4.0 - 10.5 K/uL 10.4 8.6 10.0  Hemoglobin 13.0 - 17.0 g/dL 12.8(L) 12.4(L) 12.4(L)  Hematocrit 39.0 - 52.0 % 37.2(L) 35.9(L) 34.3(L)  Platelets 150 - 400 K/uL 248 214 220   CMP Latest Ref Rng & Units 05/22/2019 05/21/2019 05/20/2019  Glucose 70 - 99 mg/dL 140(H) 95 85  BUN 6 - 20 mg/dL 12 8 5(L)  Creatinine 0.61 - 1.24 mg/dL 0.71 0.73 0.73  Sodium 135 - 145 mmol/L 135 140 135  Potassium 3.5 - 5.1 mmol/L 4.4 3.8 3.6  Chloride 98 - 111 mmol/L 103 108 100  CO2 22 - 32 mmol/L 26 25 25   Calcium 8.9 - 10.3 mg/dL 8.4(L) 8.1(L) 7.9(L)  Total Protein 6.5 - 8.1 g/dL - 6.6 -  Total Bilirubin 0.3 - 1.2 mg/dL - 0.9 -  Alkaline Phos 38 - 126 U/L - 56 -  AST 15 - 41 U/L - 17 -  ALT 0 - 44 U/L - 12 -    Imaging studies: No new pertinent imaging studies   Assessment/Plan:  54 y.o. male with left leg ulcer 1 Day Post-Op s/p cleansing and debridement.  Patient healing adequately, wound without purulence or necrotic tissue today.  Patient reliable to change dressing. Responding well to antibiotic therapy. Cellulitis and swelling slowly decreasing. From wound standpoint, patient can be discharge. I gave some supplies to patient and ordered Aquacel Ag for daily dressing changes. I will follow closely in my office. Patient understand that if there is any deterioration of the wound, worsening leg swelling, fever or chills, will need to come back.   Arnold Long, MD

## 2019-05-25 ENCOUNTER — Inpatient Hospital Stay: Payer: BC Managed Care – PPO | Admitting: Anesthesiology

## 2019-05-25 ENCOUNTER — Encounter: Payer: Self-pay | Admitting: Adult Health

## 2019-05-25 ENCOUNTER — Encounter: Payer: Self-pay | Admitting: General Surgery

## 2019-05-25 ENCOUNTER — Other Ambulatory Visit: Payer: Self-pay

## 2019-05-25 ENCOUNTER — Ambulatory Visit: Admit: 2019-05-25 | Payer: BC Managed Care – PPO | Admitting: General Surgery

## 2019-05-25 ENCOUNTER — Encounter
Admission: AD | Disposition: A | Discharge status: Home / Self Care | Payer: Self-pay | Source: Ambulatory Visit | Attending: General Surgery

## 2019-05-25 ENCOUNTER — Inpatient Hospital Stay
Admission: AD | Admit: 2019-05-25 | Discharge: 2019-05-27 | Disposition: A | Discharge status: Home / Self Care | Payer: BC Managed Care – PPO | Source: Ambulatory Visit | Attending: General Surgery | Admitting: General Surgery

## 2019-05-25 DIAGNOSIS — E876 Hypokalemia: Secondary | ICD-10-CM | POA: Diagnosis not present

## 2019-05-25 DIAGNOSIS — L97923 Non-pressure chronic ulcer of unspecified part of left lower leg with necrosis of muscle: Secondary | ICD-10-CM | POA: Diagnosis present

## 2019-05-25 DIAGNOSIS — L089 Local infection of the skin and subcutaneous tissue, unspecified: Secondary | ICD-10-CM | POA: Diagnosis not present

## 2019-05-25 DIAGNOSIS — L97925 Non-pressure chronic ulcer of unspecified part of left lower leg with muscle involvement without evidence of necrosis: Secondary | ICD-10-CM | POA: Diagnosis not present

## 2019-05-25 DIAGNOSIS — L97929 Non-pressure chronic ulcer of unspecified part of left lower leg with unspecified severity: Secondary | ICD-10-CM | POA: Diagnosis present

## 2019-05-25 DIAGNOSIS — J449 Chronic obstructive pulmonary disease, unspecified: Secondary | ICD-10-CM | POA: Diagnosis not present

## 2019-05-25 DIAGNOSIS — I10 Essential (primary) hypertension: Secondary | ICD-10-CM | POA: Diagnosis not present

## 2019-05-25 HISTORY — PX: I & D EXTREMITY: SHX5045

## 2019-05-25 LAB — URINE DRUG SCREEN, QUALITATIVE (ARMC ONLY)
Amphetamines, Ur Screen: NOT DETECTED
Barbiturates, Ur Screen: NOT DETECTED
Benzodiazepine, Ur Scrn: NOT DETECTED
Cannabinoid 50 Ng, Ur ~~LOC~~: NOT DETECTED
Cocaine Metabolite,Ur ~~LOC~~: NOT DETECTED
MDMA (Ecstasy)Ur Screen: NOT DETECTED
Methadone Scn, Ur: NOT DETECTED
Opiate, Ur Screen: NOT DETECTED
Phencyclidine (PCP) Ur S: NOT DETECTED
Tricyclic, Ur Screen: NOT DETECTED

## 2019-05-25 LAB — RESPIRATORY PANEL BY RT PCR (FLU A&B, COVID)
Influenza A by PCR: NEGATIVE
Influenza B by PCR: NEGATIVE
SARS Coronavirus 2 by RT PCR: NEGATIVE

## 2019-05-25 LAB — MRSA PCR SCREENING: MRSA by PCR: NEGATIVE

## 2019-05-25 SURGERY — IRRIGATION AND DEBRIDEMENT EXTREMITY
Anesthesia: General | Laterality: Left

## 2019-05-25 MED ORDER — ALBUTEROL SULFATE (2.5 MG/3ML) 0.083% IN NEBU: 2.5000 mg | INHALATION_SOLUTION | Freq: Four times a day (QID) | RESPIRATORY_TRACT | Status: DC | PRN

## 2019-05-25 MED ORDER — MIDAZOLAM HCL 2 MG/2ML IJ SOLN
INTRAMUSCULAR | Status: AC
  Filled 2019-05-25: qty 2

## 2019-05-25 MED ORDER — MORPHINE SULFATE (PF) 4 MG/ML IV SOLN
4.0000 mg | INTRAVENOUS | Status: DC | PRN
  Filled 2019-05-25: qty 1

## 2019-05-25 MED ORDER — ESCITALOPRAM OXALATE 10 MG PO TABS
20.0000 mg | ORAL_TABLET | Freq: Every day | ORAL | Status: DC
  Administered 2019-05-25 – 2019-05-26 (×2): 20 mg via ORAL
  Filled 2019-05-25 (×3): qty 2

## 2019-05-25 MED ORDER — LORATADINE 10 MG PO TABS
10.0000 mg | ORAL_TABLET | Freq: Every day | ORAL | Status: DC
  Administered 2019-05-25 – 2019-05-27 (×3): 10 mg via ORAL
  Filled 2019-05-25 (×3): qty 1

## 2019-05-25 MED ORDER — ONDANSETRON HCL 4 MG/2ML IJ SOLN
4.0000 mg | Freq: Once | INTRAMUSCULAR | Status: AC | PRN
  Administered 2019-05-25: 4 mg via INTRAVENOUS

## 2019-05-25 MED ORDER — SUCCINYLCHOLINE CHLORIDE 20 MG/ML IJ SOLN
INTRAMUSCULAR | Status: DC | PRN
  Administered 2019-05-25: 100 mg via INTRAVENOUS

## 2019-05-25 MED ORDER — GABAPENTIN 300 MG PO CAPS
300.0000 mg | ORAL_CAPSULE | Freq: Three times a day (TID) | ORAL | Status: DC
  Administered 2019-05-25 – 2019-05-27 (×6): 300 mg via ORAL
  Filled 2019-05-25 (×6): qty 1

## 2019-05-25 MED ORDER — LIDOCAINE HCL (CARDIAC) PF 100 MG/5ML IV SOSY
PREFILLED_SYRINGE | INTRAVENOUS | Status: DC | PRN
  Administered 2019-05-25: 100 mg via INTRAVENOUS

## 2019-05-25 MED ORDER — ENOXAPARIN SODIUM 40 MG/0.4ML ~~LOC~~ SOLN
40.0000 mg | SUBCUTANEOUS | Status: DC
  Administered 2019-05-25 – 2019-05-26 (×2): 40 mg via SUBCUTANEOUS
  Filled 2019-05-25 (×2): qty 0.4

## 2019-05-25 MED ORDER — SODIUM CHLORIDE 0.9 % IV SOLN: INTRAVENOUS | Status: DC

## 2019-05-25 MED ORDER — ONDANSETRON HCL 4 MG/2ML IJ SOLN
4.0000 mg | Freq: Four times a day (QID) | INTRAMUSCULAR | Status: DC | PRN
  Filled 2019-05-25: qty 2

## 2019-05-25 MED ORDER — LISINOPRIL 10 MG PO TABS
10.0000 mg | ORAL_TABLET | Freq: Every day | ORAL | Status: DC
  Administered 2019-05-25 – 2019-05-27 (×3): 10 mg via ORAL
  Filled 2019-05-25 (×3): qty 1

## 2019-05-25 MED ORDER — SUCCINYLCHOLINE CHLORIDE 20 MG/ML IJ SOLN
INTRAMUSCULAR | Status: AC
  Filled 2019-05-25: qty 1

## 2019-05-25 MED ORDER — LIDOCAINE HCL (PF) 2 % IJ SOLN
INTRAMUSCULAR | Status: AC
  Filled 2019-05-25: qty 10

## 2019-05-25 MED ORDER — ROCURONIUM BROMIDE 50 MG/5ML IV SOLN
INTRAVENOUS | Status: AC
  Filled 2019-05-25: qty 1

## 2019-05-25 MED ORDER — PROPOFOL 10 MG/ML IV BOLUS
INTRAVENOUS | Status: DC | PRN
  Administered 2019-05-25: 200 mg via INTRAVENOUS

## 2019-05-25 MED ORDER — PROPOFOL 10 MG/ML IV BOLUS
INTRAVENOUS | Status: AC
  Filled 2019-05-25: qty 20

## 2019-05-25 MED ORDER — MIDAZOLAM HCL 2 MG/2ML IJ SOLN
INTRAMUSCULAR | Status: DC | PRN
  Administered 2019-05-25: 2 mg via INTRAVENOUS

## 2019-05-25 MED ORDER — FENTANYL CITRATE (PF) 100 MCG/2ML IJ SOLN
INTRAMUSCULAR | Status: DC | PRN
  Administered 2019-05-25 (×2): 50 ug via INTRAVENOUS

## 2019-05-25 MED ORDER — LACTATED RINGERS IV SOLN: INTRAVENOUS | Status: DC | PRN

## 2019-05-25 MED ORDER — ONDANSETRON 4 MG PO TBDP: 4.0000 mg | ORAL_TABLET | Freq: Four times a day (QID) | ORAL | Status: DC | PRN

## 2019-05-25 MED ORDER — FENTANYL CITRATE (PF) 100 MCG/2ML IJ SOLN
INTRAMUSCULAR | Status: AC
  Filled 2019-05-25: qty 2

## 2019-05-25 MED ORDER — EPHEDRINE SULFATE 50 MG/ML IJ SOLN
INTRAMUSCULAR | Status: DC | PRN
  Administered 2019-05-25 (×2): 5 mg via INTRAVENOUS

## 2019-05-25 MED ORDER — ACETAMINOPHEN 650 MG RE SUPP: 650.0000 mg | Freq: Four times a day (QID) | RECTAL | Status: DC | PRN

## 2019-05-25 MED ORDER — ALBUTEROL SULFATE HFA 108 (90 BASE) MCG/ACT IN AERS: 1.0000 | INHALATION_SPRAY | Freq: Four times a day (QID) | RESPIRATORY_TRACT | Status: DC | PRN

## 2019-05-25 MED ORDER — FLUTICASONE PROPIONATE 50 MCG/ACT NA SUSP
1.0000 | Freq: Every day | NASAL | Status: DC
  Filled 2019-05-25: qty 16

## 2019-05-25 MED ORDER — MIRTAZAPINE 15 MG PO TABS
30.0000 mg | ORAL_TABLET | Freq: Every day | ORAL | Status: DC
  Administered 2019-05-25 – 2019-05-26 (×2): 30 mg via ORAL
  Filled 2019-05-25 (×2): qty 2

## 2019-05-25 MED ORDER — PIPERACILLIN-TAZOBACTAM 3.375 G IVPB
3.3750 g | Freq: Three times a day (TID) | INTRAVENOUS | Status: DC
  Administered 2019-05-25 – 2019-05-26 (×4): 3.375 g via INTRAVENOUS
  Filled 2019-05-25 (×4): qty 50

## 2019-05-25 MED ORDER — HYDROCODONE-ACETAMINOPHEN 5-325 MG PO TABS
1.0000 | ORAL_TABLET | ORAL | Status: DC | PRN
  Administered 2019-05-25 – 2019-05-26 (×5): 1 via ORAL
  Filled 2019-05-25: qty 2
  Filled 2019-05-25 (×4): qty 1

## 2019-05-25 MED ORDER — BICTEGRAVIR-EMTRICITAB-TENOFOV 50-200-25 MG PO TABS
1.0000 | ORAL_TABLET | Freq: Every day | ORAL | Status: DC
  Administered 2019-05-25 – 2019-05-27 (×3): 1 via ORAL
  Filled 2019-05-25 (×4): qty 1

## 2019-05-25 MED ORDER — ACETAMINOPHEN 325 MG PO TABS
650.0000 mg | ORAL_TABLET | Freq: Four times a day (QID) | ORAL | Status: DC | PRN
  Administered 2019-05-26 (×2): 650 mg via ORAL
  Filled 2019-05-25 (×2): qty 2

## 2019-05-25 MED ORDER — ONDANSETRON HCL 4 MG/2ML IJ SOLN
INTRAMUSCULAR | Status: AC
  Filled 2019-05-25: qty 2

## 2019-05-25 MED ORDER — FENTANYL CITRATE (PF) 100 MCG/2ML IJ SOLN: 25.0000 ug | INTRAMUSCULAR | Status: DC | PRN

## 2019-05-25 SURGICAL SUPPLY — 24 items
BLADE CLIPPER SURG (BLADE) ×3 IMPLANT
BLADE SURG 15 STRL LF DISP TIS (BLADE) ×1 IMPLANT
BLADE SURG 15 STRL SS (BLADE) ×2
BRUSH SCRUB EZ  4% CHG (MISCELLANEOUS)
BRUSH SCRUB EZ 4% CHG (MISCELLANEOUS) IMPLANT
CANISTER SUCT 3000ML PPV (MISCELLANEOUS) ×3 IMPLANT
COVER WAND RF STERILE (DRAPES) ×3 IMPLANT
DRAPE CHEST BREAST 77X106 FENE (MISCELLANEOUS) ×3 IMPLANT
DRAPE LAPAROTOMY 77X122 PED (DRAPES) ×3 IMPLANT
ELECT REM PT RETURN 9FT ADLT (ELECTROSURGICAL) ×3
ELECTRODE REM PT RTRN 9FT ADLT (ELECTROSURGICAL) ×1 IMPLANT
GLOVE BIO SURGEON STRL SZ 6.5 (GLOVE) ×2 IMPLANT
GLOVE BIO SURGEONS STRL SZ 6.5 (GLOVE) ×1
GLOVE BIOGEL PI IND STRL 6.5 (GLOVE) ×1 IMPLANT
GLOVE BIOGEL PI INDICATOR 6.5 (GLOVE) ×2
GOWN STRL REUS W/ TWL LRG LVL3 (GOWN DISPOSABLE) ×2 IMPLANT
GOWN STRL REUS W/TWL LRG LVL3 (GOWN DISPOSABLE) ×4
NEEDLE HYPO 22GX1.5 SAFETY (NEEDLE) ×3 IMPLANT
NS IRRIG 1000ML POUR BTL (IV SOLUTION) ×3 IMPLANT
PACK BASIN MINOR ARMC (MISCELLANEOUS) ×3 IMPLANT
SOL PREP PVP 2OZ (MISCELLANEOUS) ×3
SOLUTION PREP PVP 2OZ (MISCELLANEOUS) ×1 IMPLANT
SPONGE LAP 18X18 RF (DISPOSABLE) ×3 IMPLANT
aquacell advantage 4x5 ×3 IMPLANT

## 2019-05-25 NOTE — Op Note (Signed)
ATTENDINGSurgeon(s):Verble Styron Cintron-Diaz, MD  ANESTHESIA:General  PRE-OPERATIVE DIAGNOSIS:Stage 3 infected left Leg Ulcer  POST-OPERATIVE DIAGNOSIS:Stage 3 infected left leg ulcer  PROCEDURE(S):  1.)Sharp excisional debridement ofleft leg ulcer down to muscle   INTRAOPERATIVE FINDINGS: A 11 cm x 7 cm and 0.7 cm deep stage 3 left leg ulcer with purulent secretions  ESTIMATED BLOOD LOSS: Minimal (<67mL)  SPECIMENS:None  COMPLICATIONS: None apparent  CONDITION AT END OF PROCEDURE: Hemodynamically stable and awake  INDICATIONS FOR PROCEDURE: Patient is 53y.o. malewith left leg ulcer status post debridement 3 days ago. Today comes with increased redness, tenderness and suspected deeper infection.   DETAILS OF PROCEDURE: After informed consent, patient was taken to the OR. Time out performed. General anesthesia induced. Patient placed onsupine position.The left leg was cleaned and draped in sterile fashion. With #15 blade and scissors, fibrin tissue from the left leg area was resected. Ischemic fat tissue was debrided down to the muscle. Undermining around the ulcer was done and no purulent drainage identified. Hemostasis achieved. Aquacel Ag dressing was applied. Patient tolerated the procedure well.
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
## 2019-05-25 NOTE — Transfer of Care (Signed)
Immediate Anesthesia Transfer of Care Note  Patient: [name]  Procedure(s) Performed: IRRIGATION AND DEBRIDEMENT Left Leg (Left )  Patient Location: PACU  Anesthesia Type:General  Level of Consciousness: awake and patient cooperative  Airway & Oxygen Therapy: Patient Spontanous Breathing and Patient connected to face mask oxygen  Post-op Assessment: Report given to RN and Post -op Vital signs reviewed and stable  Post vital signs: Reviewed and stable  Last Vitals:  Vitals Value Taken Time  BP 151/95 05/25/19 1638  Temp 36.3 C 05/25/19 1623  Pulse 77 05/25/19 1650  Resp 14 05/25/19 1650  SpO2 97 % 05/25/19 1650  Vitals shown include unvalidated device data.  Last Pain:  Vitals:   05/25/19 1623  TempSrc:   PainSc: 0-No pain         Complications: No apparent anesthesia complications

## 2019-05-25 NOTE — H&P (Signed)
HISTORY OF PRESENT ILLNESS:   Mr. [name] is a 54 y.o.male patient who comes for follow up after cleansing and debridement of the left leg ulcer.  The patient reported that he was not able to get the dressing prescribed at the pharmacy because they did not fill it up. He reports that he has been having increasing pain and swelling of the left leg. He denies fever or chills. The pains radiates to the left foot. Aggravating factor is applying pressure on the left leg. There is no alleviating factor.  Patient had cleansing of the wound of the left leg 4 days ago. He was discharged from the hospital with improved cellulitis. I personally changed the dressing on Saturday. He has been taking antibiotic therapy as prescribed. Cultures from the wound from admission shows strep pyogenes that is susceptible to penicillin. There was some Staphylococcus epidermidis and susceptibilities currently in process. I evaluated this result from [facility].    PAST MEDICAL HISTORY:  Past Medical History:  Diagnosis Date  . Anxiety  . HIV (human immunodeficiency virus infection) (CMS-HCC)  . Hypertension     PAST SURGICAL HISTORY:  Continue on debridement of the left leg on 05/21/2019  MEDICATIONS:  Outpatient Encounter Medications as of 05/25/2019  Medication Sig Dispense Refill  . albuterol 90 mcg/actuation inhaler Inhale into the lungs every 6 (six) hours as needed  . bictegravir-emtricitabine-tenofovir alafenamide (BIKTARVY) 50-200-25 mg tablet Take 1 tablet by mouth once daily  . cephalexin (KEFLEX) 500 MG capsule Take 1 capsule by mouth 3 (three) times daily  . cetirizine (ZYRTEC) 10 MG tablet Take 1 tablet by mouth once daily  . escitalopram oxalate (LEXAPRO) 20 MG tablet Take 1 tablet by mouth once daily  . fluticasone propionate (FLONASE) 50 mcg/actuation nasal spray by Nasal route as needed  . gabapentin (NEURONTIN) 300 MG capsule Take 1 capsule by mouth 3 (three) times daily  . hydrOXYzine (ATARAX) 25  MG tablet Take 1 tablet by mouth nightly  . lisinopriL (ZESTRIL) 10 MG tablet Take 1 tablet by mouth once daily  . mirtazapine (REMERON) 30 MG tablet Take 1 tablet by mouth nightly  . mometasone-formoterol (DULERA) 200-5 mcg/actuation inhaler Inhale into the lungs as needed  . sulfamethoxazole-trimethoprim (BACTRIM DS) 800-160 mg tablet Take 1 tablet by mouth 2 (two) times daily  . valACYclovir (VALTREX) 1000 MG tablet Take 1 tablet by mouth 2 (two) times daily   No facility-administered encounter medications on file as of 05/25/2019.    ALLERGIES:  Patient has no known allergies.  SOCIAL HISTORY:  Social History   Socioeconomic History  . Marital status: Divorced  Spouse name: Not on file  . Number of children: Not on file  . Years of education: Not on file  . Highest education level: Not on file  Occupational History  . Not on file  Social Needs  . Financial resource strain: Not on file  . Food insecurity  Worry: Not on file  Inability: Not on file  . Transportation needs  Medical: Not on file  Non-medical: Not on file  Tobacco Use  . Smoking status: Current Every Day Smoker  Packs/day: 1.50  . Smokeless tobacco: Never Used  Substance and Sexual Activity  . Alcohol use: Yes  Comment: trying to quit (2/21)  . Drug use: Never  . Sexual activity: Not on file  Other Topics Concern  . Not on file  Social History Narrative  . Not on file   FAMILY HISTORY:  Family History  Adopted:  Yes   PHYSICAL EXAM:  Vitals:  05/25/19 0908  BP: (!) 151/92  Pulse: 94  .  Ht:176.5 cm (5' 9.5") Wt:77.1 kg (170 lb) [id number] surface area is 1.94 meters squared. Body mass index is 24.74 kg/m.Marland Kitchen  GENERAL: Alert, active, oriented x3  EXTREMITIES: Left leg also with serous drainage but increased cellulitis. There is concerning of deeper abscess.  NEUROLOGICAL: Awake alert oriented, facial expression symmetrical, moving all extremities.   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 I am concerned of a deeper infection. Due to the amount of pain I will need to take the patient to the OR for appropriate cleansing and debridement. Patient understood the risk and benefit of the surgery and agreed to proceed. We will admit patient for IV antibiotics.   PLAN:  1. Admit patient to surgical service for cleansing and debridement and IV antibiotic therapy  Patient verbalized understanding, all questions were answered, and were agreeable with the plan outlined above.   Herbert Pun, MD  Electronically signed by Herbert Pun, MD

## 2019-05-25 NOTE — Interval H&P Note (Signed)
History and Physical Interval Note:  05/25/2019 1:54 PM  [name]  has presented today for surgery, with the diagnosis of Left Leg infected ulcer.  The various methods of treatment have been discussed with the patient and family. After consideration of risks, benefits and other options for treatment, the patient has consented to  Procedure(s): IRRIGATION AND DEBRIDEMENT Left Leg (Left) as a surgical intervention.  The patient's history has been reviewed, patient examined, no change in status, stable for surgery.  I have reviewed the patient's chart and labs.  Left leg marked. Questions were answered to the patient's satisfaction.     Herbert Pun

## 2019-05-25 NOTE — Progress Notes (Signed)
Scanned patient disability paperwork,put copy up front for pick up. [name]

## 2019-05-25 NOTE — Anesthesia Procedure Notes (Signed)
Procedure Name: Intubation Date/Time: 05/25/2019 3:44 PM Performed by: Lia Foyer, CRNA Pre-anesthesia Checklist: Patient identified, Emergency Drugs available, Suction available and Patient being monitored Patient Re-evaluated:Patient Re-evaluated prior to induction Oxygen Delivery Method: Circle system utilized Preoxygenation: Pre-oxygenation with 100% oxygen Induction Type: IV induction Ventilation: Mask ventilation without difficulty Laryngoscope Size: 3 and McGraph Tube type: Oral Tube size: 7.0 mm Number of attempts: 1 Airway Equipment and Method: Stylet and Oral airway Placement Confirmation: ETT inserted through vocal cords under direct vision,  positive ETCO2 and breath sounds checked- equal and bilateral Tube secured with: Tape Dental Injury: Teeth and Oropharynx as per pre-operative assessment  Comments: DVL and intubation performed by Harlow Ohms SRNA under supervision of Dr. Oren Section and Floyce Stakes CRNA.

## 2019-05-26 LAB — AEROBIC/ANAEROBIC CULTURE W GRAM STAIN (SURGICAL/DEEP WOUND)

## 2019-05-26 MED ORDER — DIPHENHYDRAMINE HCL 50 MG/ML IJ SOLN
25.0000 mg | Freq: Once | INTRAMUSCULAR | Status: AC
  Administered 2019-05-26: 15:00:00 25 mg via INTRAVENOUS
  Filled 2019-05-26: qty 1

## 2019-05-26 MED ORDER — DIPHENHYDRAMINE HCL 50 MG/ML IJ SOLN
25.0000 mg | Freq: Once | INTRAMUSCULAR | Status: AC
  Administered 2019-05-26: 14:00:00 25 mg via INTRAVENOUS
  Filled 2019-05-26: qty 1

## 2019-05-26 MED ORDER — HYDRALAZINE HCL 20 MG/ML IJ SOLN
10.0000 mg | Freq: Three times a day (TID) | INTRAMUSCULAR | Status: DC | PRN
  Administered 2019-05-26 (×2): 10 mg via INTRAVENOUS
  Filled 2019-05-26 (×2): qty 1

## 2019-05-26 MED ORDER — COLLAGENASE 250 UNIT/GM EX OINT
TOPICAL_OINTMENT | Freq: Every day | CUTANEOUS | Status: DC
  Filled 2019-05-26: qty 30

## 2019-05-26 MED ORDER — DOXYCYCLINE HYCLATE 100 MG PO TABS
100.0000 mg | ORAL_TABLET | Freq: Two times a day (BID) | ORAL | Status: DC
  Administered 2019-05-26 – 2019-05-27 (×3): 100 mg via ORAL
  Filled 2019-05-26 (×3): qty 1

## 2019-05-26 NOTE — Progress Notes (Signed)
[place] Hospital Day(s): 1.   Post op day(s): 1 Day Post-Op.   Interval History: Patient seen and examined, no acute events or new complaints overnight. Patient reports feeling itching on the area.  He reported the pain is tolerable.  He denies fever or chills.  Vital signs in last 24 hours: [min-max] current  Temp:  [97.5 F (36.4 C)-98.6 F (37 C)] 97.5 F (36.4 C) (02/03 1244) Pulse Rate:  [60-89] 88 (02/03 1416) Resp:  [17-20] 17 (02/03 1415) BP: (144-172)/(84-117) 152/91 (02/03 1416) SpO2:  [98 %-100 %] 100 % (02/03 1415)     Height: 5\' 9"  (175.3 cm) Weight: 75 kg BMI (Calculated): 24.41   Physical Exam:  Constitutional: alert, cooperative and no distress  Extremity: Left leg with reduce erythema.  Reduced swelling.  Open wound without purulent secretions.  Labs:  CBC Latest Ref Rng & Units 05/22/2019 05/21/2019 05/20/2019  WBC 4.0 - 10.5 K/uL 10.4 8.6 10.0  Hemoglobin 13.0 - 17.0 g/dL 12.8(L) 12.4(L) 12.4(L)  Hematocrit 39.0 - 52.0 % 37.2(L) 35.9(L) 34.3(L)  Platelets 150 - 400 K/uL 248 214 220   CMP Latest Ref Rng & Units 05/22/2019 05/21/2019 05/20/2019  Glucose 70 - 99 mg/dL 140(H) 95 85  BUN 6 - 20 mg/dL 12 8 5(L)  Creatinine 0.61 - 1.24 mg/dL 0.71 0.73 0.73  Sodium 135 - 145 mmol/L 135 140 135  Potassium 3.5 - 5.1 mmol/L 4.4 3.8 3.6  Chloride 98 - 111 mmol/L 103 108 100  CO2 22 - 32 mmol/L 26 25 25   Calcium 8.9 - 10.3 mg/dL 8.4(L) 8.1(L) 7.9(L)  Total Protein 6.5 - 8.1 g/dL - 6.6 -  Total Bilirubin 0.3 - 1.2 mg/dL - 0.9 -  Alkaline Phos 38 - 126 U/L - 56 -  AST 15 - 41 U/L - 17 -  ALT 0 - 44 U/L - 12 -    Imaging studies: No new pertinent imaging studies   Assessment/Plan:  54 y.o. male with left leg ulcer 1 Day Post-Op s/p cleansing and debridement.  Patient responding adequately to the continued debridement and antibiotic therapy.  We will transition patient to oral therapy as per culture results.  I gave patient local care on the wound  is healing appropriately.  We will continue with DVT prophylaxis.  We will continue with antibiotic therapy.  Encourage the patient to ambulate.  Arnold Long, MD

## 2019-05-26 NOTE — Anesthesia Postprocedure Evaluation (Signed)
Anesthesia Post Note  Patient: [name]  Procedure(s) Performed: IRRIGATION AND DEBRIDEMENT Left Leg (Left )  Patient location during evaluation: PACU Anesthesia Type: General Level of consciousness: awake and alert and oriented Pain management: pain level controlled Vital Signs Assessment: post-procedure vital signs reviewed and stable Respiratory status: spontaneous breathing Cardiovascular status: blood pressure returned to baseline Anesthetic complications: no     Last Vitals:  Vitals:   05/26/19 0443 05/26/19 0744  BP: (!) 150/94 (!) 171/101  Pulse:  60  Resp:  20  Temp:  (!) 36.4 C  SpO2:  100%    Last Pain:  Vitals:   05/26/19 0744  TempSrc: Oral  PainSc:                  Sherrel Ploch

## 2019-05-26 NOTE — Progress Notes (Signed)
RN notified MD pt was c/o of tightness in his throat. RN and Zack PA went in the room to assess pt. Pt stated he is able to swallow and that his is feeling better now. Zack recommended to keep closely monitoring pt. Dr. Peyton Najjar was also notified she gave verbal order to nurse. To give 25 mg of benadryl  IV and DC zosyn.

## 2019-05-27 MED ORDER — SANTYL 250 UNIT/GM EX OINT: 1.0000 "application " | TOPICAL_OINTMENT | Freq: Every day | CUTANEOUS | 0 refills | Status: DC

## 2019-05-27 MED ORDER — DOXYCYCLINE HYCLATE 100 MG PO TABS: 100.0000 mg | ORAL_TABLET | Freq: Two times a day (BID) | ORAL | 0 refills | Status: AC

## 2019-05-27 NOTE — Progress Notes (Signed)
Justin Barbara  Morrison and O x 4 VSS. Pt tolerating diet well. No complaints of pain or nausea. IV removed intact, prescriptions given. Pt voices understanding of discharge instructions with no further questions. Pt discharged via wheelchair with axillary.   Allergies as of 05/27/2019      Reactions   Other    Dust mites and roaches       Medication List    TAKE these medications   albuterol 108 (90 Base) MCG/ACT inhaler Commonly known as: VENTOLIN HFA Inhale 1-2 puffs into the lungs every 6 (six) hours as needed for wheezing or shortness of breath.   Aquacel Ag Foam 3.2"X3.2" Pads Apply 1 Film topically daily.   bictegravir-emtricitabine-tenofovir AF 50-200-25 MG Tabs tablet Commonly known as: BIKTARVY Take 1 tablet by mouth daily.   cephALEXin 500 MG capsule Commonly known as: KEFLEX Take 1 capsule (500 mg total) by mouth 3 (three) times daily for 10 days.   cetirizine 10 MG tablet Commonly known as: ZYRTEC Take 10 mg by mouth daily.   doxycycline 100 MG tablet Commonly known as: VIBRA-TABS Take 1 tablet (100 mg total) by mouth every 12 (twelve) hours for 7 days.   escitalopram 20 MG tablet Commonly known as: LEXAPRO TAKE 1 TABLET BY MOUTH DAILY WITH SUPPER   fluticasone 50 MCG/ACT nasal spray Commonly known as: FLONASE Place 1 spray into both nostrils daily.   gabapentin 300 MG capsule Commonly known as: NEURONTIN Take 1 capsule (300 mg total) by mouth 3 (three) times daily.   lisinopril 10 MG tablet Commonly known as: ZESTRIL Take 1 tablet (10 mg total) by mouth daily.   mirtazapine 30 MG tablet Commonly known as: REMERON TAKE 1 TABLET BY MOUTH AT BEDTIME.   Santyl ointment Generic drug: collagenase Apply 1 application topically daily.   sulfamethoxazole-trimethoprim 800-160 MG tablet Commonly known as: BACTRIM DS Take 1 tablet by mouth 2 (two) times daily.   valACYclovir 1000 MG tablet Commonly known as: VALTREX Take 1 tablet (1,000 mg total) by mouth 2 (two)  times daily. What changed:   when to take this  reasons to take this       Vitals:   05/26/19 2252 05/27/19 0517  BP: (!) 156/80 (!) 164/80  Pulse: 88 78  Resp:  18  Temp:  98.3 F (36.8 C)  SpO2: 99% 100%    [name] [name]

## 2019-05-27 NOTE — Discharge Summary (Signed)
Patient ID: [name] MRN: [id number] DOB/AGE: [birth year] 52 y.o.  Admit date: 05/25/2019 Discharge date: 05/27/2019   Discharge Diagnoses:  Active Problems:   Infected traumatic leg ulcer, left, with necrosis of muscle (HCC)   Leg ulcer, left ([place])   Procedures: Left leg cleansing and debridment  Hospital Course: Patient worsening pain and erythema around the left leg ulcer.  Patient was taken to the OR for cleansing and debridement.  Patient admitted for IV antibiotic therapy.  Patient has been responding well to IV antibiotic therapy and local care.  Antibiotics transition to oral therapy and tolerating well.  This morning the wound without associated cellulitis.  There is no necrotic tissue.  There is no purulent drainage.  Patient oriented of how to do local care with Santyl collagenase and is feeling comfortable.  Physical Exam  Constitutional: He is oriented to person, place, and time.  HENT:  Head: Normocephalic.  Cardiovascular: Normal rate and regular rhythm.  Pulmonary/Chest: Effort normal.  Abdominal: Soft. Bowel sounds are normal.  Musculoskeletal:        General: Normal range of motion.     Cervical back: Normal range of motion.  Neurological: He is alert and oriented to person, place, and time.  Left leg ulcer without erythema.  There is no necrotic tissue, there is no purulent drainage.   Consults: None  Disposition: Discharge disposition: 01-Home or Self Care       Discharge Instructions    Diet - low sodium heart healthy   Complete by: As directed    Increase activity slowly   Complete by: As directed      Allergies as of 05/27/2019      Reactions   Other    Dust mites and roaches       Medication List    TAKE these medications   albuterol 108 (90 Base) MCG/ACT inhaler Commonly known as: VENTOLIN HFA Inhale 1-2 puffs into the lungs every 6 (six) hours as needed for wheezing or shortness of breath.   Aquacel Ag Foam 3.2"X3.2" Pads Apply 1 Film  topically daily.   bictegravir-emtricitabine-tenofovir AF 50-200-25 MG Tabs tablet Commonly known as: BIKTARVY Take 1 tablet by mouth daily.   cephALEXin 500 MG capsule Commonly known as: KEFLEX Take 1 capsule (500 mg total) by mouth 3 (three) times daily for 10 days.   cetirizine 10 MG tablet Commonly known as: ZYRTEC Take 10 mg by mouth daily.   doxycycline 100 MG tablet Commonly known as: VIBRA-TABS Take 1 tablet (100 mg total) by mouth every 12 (twelve) hours for 7 days.   escitalopram 20 MG tablet Commonly known as: LEXAPRO TAKE 1 TABLET BY MOUTH DAILY WITH SUPPER   fluticasone 50 MCG/ACT nasal spray Commonly known as: FLONASE Place 1 spray into both nostrils daily.   gabapentin 300 MG capsule Commonly known as: NEURONTIN Take 1 capsule (300 mg total) by mouth 3 (three) times daily.   lisinopril 10 MG tablet Commonly known as: ZESTRIL Take 1 tablet (10 mg total) by mouth daily.   mirtazapine 30 MG tablet Commonly known as: REMERON TAKE 1 TABLET BY MOUTH AT BEDTIME.   Santyl ointment Generic drug: collagenase Apply 1 application topically daily.   sulfamethoxazole-trimethoprim 800-160 MG tablet Commonly known as: BACTRIM DS Take 1 tablet by mouth 2 (two) times daily.   valACYclovir 1000 MG tablet Commonly known as: VALTREX Take 1 tablet (1,000 mg total) by mouth 2 (two) times daily. What changed:   when to take this  reasons  to take this      Follow-up Information    Kendell Bane, NP Follow up.   Specialty: Adult Health Nurse Practitioner Contact information: [address] [place] [address] [phone]          This discharge plan was more than 30 minutes most of the time counseling the patient and coordinating plan of care.

## 2019-05-27 NOTE — Discharge Instructions (Signed)
  Diet: Resume home heart healthy regular diet.   Activity: Increase activity as tolerated. Light activity and walking are encouraged. Do not drive or drink alcohol if taking narcotic pain medications.  Wound care: Remove dressing tomorrow. Once dressing removed, may shower with soapy water and pat dry (do not rub incisions), but no baths or submerging incision underwater until follow-up. (no swimming)   Apply Santyl collagenase as instructed once a day.  May follow up wound with PCP or may call our office if you have any concern.   Medications: Resume all home medications. For mild to moderate pain: acetaminophen (Tylenol) or ibuprofen (if no kidney disease). Combining Tylenol with alcohol can substantially increase your risk of causing liver disease. Narcotic pain medications, if prescribed, can be used for severe pain, though may cause nausea, constipation, and drowsiness. Do not combine Tylenol and Norco within a 6 hour period as Norco contains Tylenol. If you do not need the narcotic pain medication, you do not need to fill the prescription.  Call office [phone]) at any time if any questions, worsening pain, fevers/chills, bleeding, drainage from incision site, or other concerns.

## 2019-06-01 ENCOUNTER — Telehealth: Payer: Self-pay

## 2019-06-01 NOTE — Telephone Encounter (Signed)
Confirmed appointment on 06/03/2019 and screened for covid. [facility] 

## 2019-06-03 ENCOUNTER — Ambulatory Visit: Payer: BC Managed Care – PPO | Admitting: Adult Health

## 2019-06-03 ENCOUNTER — Other Ambulatory Visit: Payer: Self-pay

## 2019-06-03 ENCOUNTER — Encounter: Payer: Self-pay | Admitting: Adult Health

## 2019-06-03 VITALS — BP 115/73 | HR 70 | Temp 95.3°F | Resp 16 | Ht 69.0 in | Wt 167.2 lb

## 2019-06-03 DIAGNOSIS — F419 Anxiety disorder, unspecified: Secondary | ICD-10-CM

## 2019-06-03 DIAGNOSIS — F17200 Nicotine dependence, unspecified, uncomplicated: Secondary | ICD-10-CM | POA: Diagnosis not present

## 2019-06-03 DIAGNOSIS — L03818 Cellulitis of other sites: Secondary | ICD-10-CM

## 2019-06-03 DIAGNOSIS — B20 Human immunodeficiency virus [HIV] disease: Secondary | ICD-10-CM | POA: Diagnosis not present
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
## 2019-06-04 ENCOUNTER — Ambulatory Visit: Payer: BC Managed Care – PPO | Admitting: Physician Assistant

## 2019-06-08 NOTE — Telephone Encounter (Signed)
Can u check an app for him

## 2019-06-09 ENCOUNTER — Encounter: Payer: BC Managed Care – PPO | Attending: Internal Medicine | Admitting: Internal Medicine

## 2019-06-09 ENCOUNTER — Other Ambulatory Visit: Payer: Self-pay

## 2019-06-09 ENCOUNTER — Telehealth: Payer: Self-pay

## 2019-06-09 DIAGNOSIS — I10 Essential (primary) hypertension: Secondary | ICD-10-CM | POA: Diagnosis not present

## 2019-06-09 DIAGNOSIS — L97822 Non-pressure chronic ulcer of other part of left lower leg with fat layer exposed: Secondary | ICD-10-CM | POA: Diagnosis not present

## 2019-06-09 DIAGNOSIS — T8189XA Other complications of procedures, not elsewhere classified, initial encounter: Secondary | ICD-10-CM | POA: Diagnosis not present

## 2019-06-09 DIAGNOSIS — Z21 Asymptomatic human immunodeficiency virus [HIV] infection status: Secondary | ICD-10-CM | POA: Insufficient documentation

## 2019-06-09 DIAGNOSIS — J449 Chronic obstructive pulmonary disease, unspecified: Secondary | ICD-10-CM | POA: Diagnosis not present

## 2019-06-09 DIAGNOSIS — F17200 Nicotine dependence, unspecified, uncomplicated: Secondary | ICD-10-CM | POA: Diagnosis not present

## 2019-06-09 NOTE — Progress Notes (Signed)
[name], [name] ([id number]) Visit Report for 06/09/2019 Allergy List Details Patient Name: [name] Date of Service: 06/09/2019 2:15 PM Medical Record Number: [id number] Patient Account Number: [id number] Date of Birth/Sex: Jun 06, 1965 (55 y.o. M) Treating RN: Army Melia Primary Care Jadda Hunsucker: Versie Starks, ADAM Other Clinician: Referring Virjean Boman: Versie Starks, ADAM Treating Elisabet Gutzmer/Extender: Ricard Dillon Weeks in Treatment: 0 Allergies Active Allergies No Known Drug Allergies Allergy Notes Electronic Signature(s) Signed: 06/09/2019 4:03:02 PM By: Army Melia Entered By: Army Melia on 06/09/2019 14:28:43 [name] ([id number]) -------------------------------------------------------------------------------- Arrival Information Details Patient Name: [name] Date of Service: 06/09/2019 2:15 PM Medical Record Number: [id number] Patient Account Number: [id number] Date of Birth/Sex: 12-14-1965 (54 y.o. M) Treating RN: Cornell Barman Primary Care Avani Sensabaugh: Versie Starks, ADAM Other Clinician: Referring Krishna Heuer: Versie Starks, ADAM Treating Zeric Baranowski/Extender: Tito Dine in Treatment: 0 Visit Information Patient Arrived: Ambulatory Arrival Time: 14:18 Accompanied By: self Transfer Assistance: None Patient Identification Verified: Yes Secondary Verification Process Completed: Yes Patient Requires Transmission-Based Yes Precautions: Transmission-Based Precautions: Contact HIV+ Patient Has Alerts: Yes Electronic Signature(s) Signed: 06/09/2019 5:27:48 PM By: Gretta Cool, BSN, RN, CWS, Kim RN, BSN Entered By: Gretta Cool, BSN, RN, CWS, Kim on 06/09/2019 15:06:56 [name] ([id number]) -------------------------------------------------------------------------------- Clinic Level of Care Assessment Details Patient Name: [name] Date of Service: 06/09/2019 2:15 PM Medical Record Number: [id number] Patient Account Number: [id number] Date of Birth/Sex: 09-07-1965 (53 y.o. M) Treating RN: Cornell Barman Primary Care Maury Groninger: Versie Starks, ADAM Other Clinician: Referring Colen Eltzroth: Versie Starks, ADAM Treating Lotus Santillo/Extender: Tito Dine in Treatment: 0 Clinic Level of Care Assessment Items TOOL 1 Quantity Score []  - Use when EandM and Procedure is performed on INITIAL visit 0 ASSESSMENTS - Nursing Assessment / Reassessment X - General Physical Exam (combine w/ comprehensive assessment (listed just below) when 1 20 performed on new pt. evals) X- 1 25 Comprehensive Assessment (HX, ROS, Risk Assessments, Wounds Hx, etc.) ASSESSMENTS - Wound and Skin Assessment / Reassessment []  - Dermatologic / Skin Assessment (not related to wound area) 0 ASSESSMENTS - Ostomy and/or Continence Assessment and Care []  - Incontinence Assessment and Management 0 []  - 0 Ostomy Care Assessment and Management (repouching, etc.) PROCESS - Coordination of Care X - Simple Patient / Family Education for ongoing care 1 15 []  - 0 Complex (extensive) Patient / Family Education for ongoing care X- 1 10 Staff obtains Programmer, systems, Records, Test Results / Process Orders []  - 0 Staff telephones HHA, Nursing Homes / Clarify orders / etc []  - 0 Routine Transfer to another Facility (non-emergent condition) []  - 0 Routine Hospital Admission (non-emergent condition) X- 1 15 New Admissions / Biomedical engineer / Ordering NPWT, Apligraf, etc. []  - 0 Emergency Hospital Admission (emergent condition) PROCESS - Special Needs []  - Pediatric / Minor Patient Management 0 []  - 0 Isolation Patient Management []  - 0 Hearing / Language / Visual special needs []  - 0 Assessment of Community assistance (transportation, D/C planning, etc.) []  - 0 Additional assistance / Altered mentation []  - 0 Support Surface(s) Assessment (bed, cushion, seat, etc.) [name], [name] ([id number]) INTERVENTIONS - Miscellaneous []  - External ear exam 0 []  - 0 Patient Transfer (multiple staff / Civil Service fast streamer / Similar devices) []  -  0 Simple Staple / Suture removal (25 or less) []  - 0 Complex Staple / Suture removal (26 or more) []  - 0 Hypo/Hyperglycemic Management (do not check if billed separately) X- 1 15 Ankle / Brachial Index (ABI) - do not check if billed separately Has the patient been seen at the hospital within the  last three years: Yes Total Score: 100 Level Of Care: New/Established - Level 3 Electronic Signature(s) Signed: 06/09/2019 5:27:48 PM By: Gretta Cool, BSN, RN, CWS, Kim RN, BSN Entered By: Gretta Cool, BSN, RN, CWS, Kim on 06/09/2019 15:03:35 [name] ([id number]) -------------------------------------------------------------------------------- Encounter Discharge Information Details Patient Name: [name] Date of Service: 06/09/2019 2:15 PM Medical Record Number: [id number] Patient Account Number: [id number] Date of Birth/Sex: 01-12-1966 (54 y.o. M) Treating RN: Cornell Barman Primary Care Dlisa Barnwell: Versie Starks, ADAM Other Clinician: Referring Clester Chlebowski: Versie Starks, ADAM Treating Mahalia Dykes/Extender: Tito Dine in Treatment: 0 Encounter Discharge Information Items Post Procedure Vitals Discharge Condition: Stable Temperature (F): 98.4 Ambulatory Status: Ambulatory Pulse (bpm): 66 Discharge Destination: Home Respiratory Rate (breaths/min): 16 Transportation: Private Auto Blood Pressure (mmHg): 120/68 Accompanied By: self Schedule Follow-up Appointment: Yes Clinical Summary of Care: Electronic Signature(s) Signed: 06/09/2019 5:27:48 PM By: Gretta Cool, BSN, RN, CWS, Kim RN, BSN Entered By: Gretta Cool, BSN, RN, CWS, Kim on 06/09/2019 15:06:29 [name] ([id number]) -------------------------------------------------------------------------------- Lower Extremity Assessment Details Patient Name: [name] Date of Service: 06/09/2019 2:15 PM Medical Record Number: [id number] Patient Account Number: [id number] Date of Birth/Sex: 02-Jun-1965 (54 y.o. M) Treating RN: Army Melia Primary Care Toddrick Sanna:  Versie Starks, ADAM Other Clinician: Referring Janaye Corp: Versie Starks, ADAM Treating Jakyrah Holladay/Extender: Tito Dine in Treatment: 0 Edema Assessment Assessed: [Left: No] [Right: No] Edema: [Left: N] [Right: o] Calf Left: Right: Point of Measurement: 32 cm From Medial Instep 36 cm cm Ankle Left: Right: Point of Measurement: 10 cm From Medial Instep 24 cm cm Vascular Assessment Pulses: Dorsalis Pedis Palpable: [Left:Yes] Blood Pressure: Brachial: [Left:122] Dorsalis Pedis: 138 Ankle: Posterior Tibial: 162 Ankle Brachial Index: [Left:1.33] Electronic Signature(s) Signed: 06/09/2019 4:03:02 PM By: Army Melia Entered By: Army Melia on 06/09/2019 14:28:29 [name] ([id number]) -------------------------------------------------------------------------------- Multi Wound Chart Details Patient Name: [name] Date of Service: 06/09/2019 2:15 PM Medical Record Number: [id number] Patient Account Number: [id number] Date of Birth/Sex: April 12, 1966 (54 y.o. M) Treating RN: Cornell Barman Primary Care [employer] Belay: Versie Starks, ADAM Other Clinician: Referring Estrellita Lasky: Versie Starks, ADAM Treating Rylen Swindler/Extender: Tito Dine in Treatment: 0 Vital Signs Height(in): 69 Pulse(bpm): 66 Weight(lbs): 165 Blood Pressure(mmHg): 120/68 Body Mass Index(BMI): 24 Temperature(F): 98.4 Respiratory Rate 16 (breaths/min): Photos: [N/A:N/A] Wound Location: Left Lower Leg - Anterior N/A N/A Wounding Event: Gradually Appeared N/A N/A Primary Etiology: Abscess N/A N/A Comorbid History: Hypertension N/A N/A Date Acquired: 05/27/2019 N/A N/A Weeks of Treatment: 0 N/A N/A Wound Status: Open N/A N/A Measurements L x W x D 3.7x1.7x0.2 N/A N/A (cm) Area (cm) : 4.94 N/A N/A Volume (cm) : 0.988 N/A N/A % Reduction in Area: 0.00% N/A N/A % Reduction in Volume: 0.00% N/A N/A Classification: Full Thickness Without N/A N/A Exposed Support Structures Exudate Amount: Medium N/A N/A Exudate Type:  Serous N/A N/A Exudate Color: amber N/A N/A Wound Margin: Flat and Intact N/A N/A Granulation Amount: Medium (34-66%) N/A N/A Granulation Quality: Pink N/A N/A Necrotic Amount: Medium (34-66%) N/A N/A Necrotic Tissue: Eschar, Adherent Slough N/A N/A Exposed Structures: Fat Layer (Subcutaneous N/A N/A Tissue) Exposed: Yes Fascia: No Tendon: No Muscle: No [name], [name] ([id number]) Joint: No Bone: No Epithelialization: None N/A N/A Treatment Notes Electronic Signature(s) Signed: 06/09/2019 5:27:48 PM By: Gretta Cool, BSN, RN, CWS, Kim RN, BSN Entered By: Gretta Cool, BSN, RN, CWS, Kim on 06/09/2019 14:58:04 [name] ([id number]) -------------------------------------------------------------------------------- Multi-Disciplinary Care Plan Details Patient Name: [name] Date of Service: 06/09/2019 2:15 PM Medical Record Number: [id number] Patient Account Number: [id number] Date of Birth/Sex: [birth year] (54 y.o. M) Treating RN:  Cornell Barman Primary Care Takeria Marquina: Versie Starks, ADAM Other Clinician: Referring Jerrianne Hartin: Versie Starks, ADAM Treating Yumalay Circle/Extender: Tito Dine in Treatment: 0 Active Inactive Necrotic Tissue Nursing Diagnoses: Impaired tissue integrity related to necrotic/devitalized tissue Goals: Necrotic/devitalized tissue will be minimized in the wound bed Date Initiated: 06/09/2019 Target Resolution Date: 06/23/2019 Goal Status: Active Interventions: Assess patient pain level pre-, during and post procedure and prior to discharge Treatment Activities: Excisional debridement : 06/09/2019 Notes: Orientation to the Wound Care Program Nursing Diagnoses: Knowledge deficit related to the wound healing center program Goals: Patient/caregiver will verbalize understanding of the [place] Date Initiated: 06/09/2019 Target Resolution Date: 06/30/2019 Goal Status: Active Interventions: Provide education on orientation to the wound center Notes: Wound/Skin  Impairment Nursing Diagnoses: Impaired tissue integrity Goals: Patient/caregiver will verbalize understanding of skin care regimen Date Initiated: 06/09/2019 Target Resolution Date: 06/30/2019 Goal Status: Active [name], [name] ([id number]) Ulcer/skin breakdown will have a volume reduction of 30% by week 4 Date Initiated: 06/09/2019 Target Resolution Date: 06/30/2019 Goal Status: Active Interventions: Provide education on ulcer and skin care Treatment Activities: Skin care regimen initiated : 06/09/2019 Topical wound management initiated : 06/09/2019 Notes: Electronic Signature(s) Signed: 06/09/2019 5:27:48 PM By: Gretta Cool, BSN, RN, CWS, Kim RN, BSN Entered By: Gretta Cool, BSN, RN, CWS, Kim on 06/09/2019 14:57:49 [name] ([id number]) -------------------------------------------------------------------------------- Pain Assessment Details Patient Name: [name] Date of Service: 06/09/2019 2:15 PM Medical Record Number: [id number] Patient Account Number: [id number] Date of Birth/Sex: 1965-09-19 (53 y.o. M) Treating RN: Cornell Barman Primary Care Ary Rudnick: Versie Starks, ADAM Other Clinician: Referring Vicie Cech: Versie Starks, ADAM Treating Eavan Gonterman/Extender: Tito Dine in Treatment: 0 Active Problems Location of Pain Severity and Description of Pain Patient Has Paino No Site Locations Pain Management and Medication Current Pain Management: Electronic Signature(s) Signed: 06/09/2019 4:13:39 PM By: Lorine Bears RCP, RRT, CHT Signed: 06/09/2019 5:27:48 PM By: Gretta Cool, BSN, RN, CWS, Kim RN, BSN Entered By: Lorine Bears on 06/09/2019 14:19:04 [name] ([id number]) -------------------------------------------------------------------------------- Patient/Caregiver Education Details Patient Name: [name] Date of Service: 06/09/2019 2:15 PM Medical Record Number: [id number] Patient Account Number: [id number] Date of Birth/Gender: 1965/11/13 (54 y.o. M) Treating RN:  Cornell Barman Primary Care Physician: Versie Starks, ADAM Other Clinician: Referring Physician: Versie Starks, ADAM Treating Physician/Extender: Tito Dine in Treatment: 0 Education Assessment Education Provided To: Patient Education Topics Provided Welcome To [facility]: Handouts: Welcome To [facility] Methods: Demonstration Responses: State content correctly Wound/Skin Impairment: Handouts: Caring for Your Ulcer Methods: Demonstration Responses: State content correctly Electronic Signature(s) Signed: 06/09/2019 5:27:48 PM By: Gretta Cool, BSN, RN, CWS, Kim RN, BSN Entered By: Gretta Cool, BSN, RN, CWS, Kim on 06/09/2019 15:03:58 [name] ([id number]) -------------------------------------------------------------------------------- Wound Assessment Details Patient Name: [name] Date of Service: 06/09/2019 2:15 PM Medical Record Number: [id number] Patient Account Number: [id number] Date of Birth/Sex: Oct 15, 1965 (53 y.o. M) Treating RN: Montey Hora Primary Care Furkan Keenum: Versie Starks, ADAM Other Clinician: Referring Kabrea Seeney: Versie Starks, ADAM Treating Takira Sherrin/Extender: Tito Dine in Treatment: 0 Wound Status Wound Number: 1 Primary Etiology: Abscess Wound Location: Left Lower Leg - Anterior Wound Status: Open Wounding Event: Gradually Appeared Comorbid History: Hypertension Date Acquired: 05/27/2019 Weeks Of Treatment: 0 Clustered Wound: No Photos Wound Measurements Length: (cm) 3.7 Width: (cm) 1.7 Depth: (cm) 0.2 Area: (cm) 4.94 Volume: (cm) 0.988 % Reduction in Area: 0% % Reduction in Volume: 0% Epithelialization: None Tunneling: No Undermining: No Wound Description Full Thickness Without Exposed Support Classification: Structures Wound Margin: Flat and Intact Exudate Medium Amount: Exudate  Type: Serous Exudate Color: amber Foul Odor After Cleansing: No Slough/Fibrino Yes Wound Bed Granulation Amount: Medium (34-66%) Exposed  Structure Granulation Quality: Pink Fascia Exposed: No Necrotic Amount: Medium (34-66%) Fat Layer (Subcutaneous Tissue) Exposed: Yes Necrotic Quality: Eschar, Adherent Slough Tendon Exposed: No Muscle Exposed: No Joint Exposed: No Bone Exposed: No [name], [name] ([id number]) Treatment Notes Wound #1 (Left, Anterior Lower Leg) Notes Santyl, bordered foam Electronic Signature(s) Signed: 06/09/2019 4:43:49 PM By: Montey Hora Signed: 06/09/2019 5:27:48 PM By: Gretta Cool, BSN, RN, CWS, Kim RN, BSN Entered By: Gretta Cool, BSN, RN, CWS, Kim on 06/09/2019 14:55:06 [name] ([id number]) -------------------------------------------------------------------------------- [place] Details Patient Name: [name] Date of Service: 06/09/2019 2:15 PM Medical Record Number: [id number] Patient Account Number: [id number] Date of Birth/Sex: Oct 14, 1965 (54 y.o. M) Treating RN: Cornell Barman Primary Care Kaleth Koy: Versie Starks, ADAM Other Clinician: Referring Elizabelle Fite: Versie Starks, ADAM Treating Daxter Paule/Extender: Tito Dine in Treatment: 0 Vital Signs Time Taken: 14:15 Temperature (F): 98.4 Height (in): 69 Pulse (bpm): 66 Source: Stated Respiratory Rate (breaths/min): 16 Weight (lbs): 165 Blood Pressure (mmHg): 120/68 Source: Measured Reference Range: 80 - 120 mg / dl Body Mass Index (BMI): 24.4 Electronic Signature(s) Signed: 06/09/2019 4:13:39 PM By: Lorine Bears RCP, RRT, CHT Entered By: Lorine Bears on 06/09/2019 14:19:46

## 2019-06-09 NOTE — Progress Notes (Signed)
IZZIAH, [name] ([id number]) Visit Report for 06/09/2019 Abuse/Suicide Risk Screen Details Patient Name: [name], [name] Date of Service: 06/09/2019 2:15 PM Medical Record Number: [id number] Patient Account Number: [id number] Date of Birth/Sex: Feb 24, 1966 (54 y.o. M) Treating RN: Army Melia Primary Care Laure Leone: Versie Starks, ADAM Other Clinician: Referring Shellsea Borunda: Versie Starks, ADAM Treating Tamirra Sienkiewicz/Extender: Tito Dine in Treatment: 0 Abuse/Suicide Risk Screen Items Answer ABUSE RISK SCREEN: Has anyone close to you tried to hurt or harm you recentlyo No Do you feel uncomfortable with anyone in your familyo No Has anyone forced you do things that you didnot want to doo No Electronic Signature(s) Signed: 06/09/2019 4:03:02 PM By: Army Melia Entered By: Army Melia on 06/09/2019 14:32:38 [name] ([id number]) -------------------------------------------------------------------------------- Activities of Daily Living Details Patient Name: [name] Date of Service: 06/09/2019 2:15 PM Medical Record Number: [id number] Patient Account Number: [id number] Date of Birth/Sex: 03-12-1966 (54 y.o. M) Treating RN: Army Melia Primary Care Angelys Yetman: Versie Starks, ADAM Other Clinician: Referring Launa Goedken: Versie Starks, ADAM Treating Staphanie Harbison/Extender: Tito Dine in Treatment: 0 Activities of Daily Living Items Answer Activities of Daily Living (Please select one for each item) Drive Automobile Completely Able Take Medications Completely Able Use Telephone Completely Able Care for Appearance Completely Able Use Toilet Completely Able Bath / Shower Completely Able Dress Self Completely Able Feed Self Completely Able Walk Completely Able Get In / Out Bed Completely Able Housework Completely Able Prepare Meals Completely Able Handle Money Completely Able Shop for Self Completely Able Electronic Signature(s) Signed: 06/09/2019 4:03:02 PM By: Army Melia Entered By: Army Melia  on 06/09/2019 14:32:49 [name] ([id number]) -------------------------------------------------------------------------------- Education Screening Details Patient Name: [name] Date of Service: 06/09/2019 2:15 PM Medical Record Number: [id number] Patient Account Number: [id number] Date of Birth/Sex: 1966/01/15 (54 y.o. M) Treating RN: Army Melia Primary Care Joan Avetisyan: Versie Starks, ADAM Other Clinician: Referring Hidaya Daniel: Versie Starks, ADAM Treating Kalem Rockwell/Extender: Tito Dine in Treatment: 0 Primary Learner Assessed: Patient Learning Preferences/Education Level/Primary Language Learning Preference: Explanation Highest Education Level: College or Above Preferred Language: English Cognitive Barrier Language Barrier: No Translator Needed: No Memory Deficit: No Emotional Barrier: No Cultural/Religious Beliefs Affecting Medical Care: No Physical Barrier Impaired Vision: No Impaired Hearing: No Decreased Hand dexterity: No Knowledge/Comprehension Knowledge Level: High Comprehension Level: High Ability to understand written High instructions: Ability to understand verbal High instructions: Motivation Anxiety Level: Calm Cooperation: Cooperative Education Importance: Acknowledges Need Interest in Health Problems: Asks Questions Perception: Coherent Willingness to Engage in Self- High Management Activities: Readiness to Engage in Self- High Management Activities: Electronic Signature(s) Signed: 06/09/2019 4:03:02 PM By: Army Melia Entered By: Army Melia on 06/09/2019 14:33:05 [name] ([id number]) -------------------------------------------------------------------------------- Fall Risk Assessment Details Patient Name: [name] Date of Service: 06/09/2019 2:15 PM Medical Record Number: [id number] Patient Account Number: [id number] Date of Birth/Sex: [birth year] (54 y.o. M) Treating RN: Army Melia Primary Care Kamauri Kathol: Versie Starks, ADAM Other  Clinician: Referring Tomorrow Dehaas: Versie Starks, ADAM Treating Amaiah Cristiano/Extender: Tito Dine in Treatment: 0 Fall Risk Assessment Items Have you had 2 or more falls in the last 12 monthso 0 No Have you had any fall that resulted in injury in the last 12 monthso 0 No FALLS RISK SCREEN History of falling - immediate or within 3 months 0 No Secondary diagnosis (Do you have 2 or more medical diagnoseso) 0 No Ambulatory aid None/bed rest/wheelchair/nurse 0 No Crutches/cane/walker 0 No Furniture 0 No Intravenous therapy Access/Saline/Heparin Lock 0 No Gait/Transferring Normal/ bed rest/ wheelchair 0 No Weak (short steps with or without shuffle, stooped but  able to lift head while 0 No walking, may seek support from furniture) Impaired (short steps with shuffle, may have difficulty arising from chair, head 0 No down, impaired balance) Mental Status Oriented to own ability 0 No Electronic Signature(s) Signed: 06/09/2019 4:03:02 PM By: Army Melia Entered By: Army Melia on 06/09/2019 14:33:09 [name] ([id number]) -------------------------------------------------------------------------------- Foot Assessment Details Patient Name: [name] Date of Service: 06/09/2019 2:15 PM Medical Record Number: [id number] Patient Account Number: [id number] Date of Birth/Sex: 03/25/1966 (54 y.o. M) Treating RN: Army Melia Primary Care Jacquez Sheetz: Versie Starks, ADAM Other Clinician: Referring Jasyn Mey: Versie Starks, ADAM Treating Kutter Schnepf/Extender: Tito Dine in Treatment: 0 Foot Assessment Items Site Locations + = Sensation present, - = Sensation absent, C = Callus, U = Ulcer R = Redness, W = Warmth, M = Maceration, PU = Pre-ulcerative lesion F = Fissure, S = Swelling, D = Dryness Assessment Right: Left: Other Deformity: No No Prior Foot Ulcer: No No Prior Amputation: No No Charcot Joint: No No Ambulatory Status: Ambulatory Without Help Gait: Steady Electronic  Signature(s) Signed: 06/09/2019 4:03:02 PM By: Army Melia Entered By: Army Melia on 06/09/2019 14:33:39 [name] ([id number]) -------------------------------------------------------------------------------- Nutrition Risk Screening Details Patient Name: [name] Date of Service: 06/09/2019 2:15 PM Medical Record Number: [id number] Patient Account Number: [id number] Date of Birth/Sex: 23-Aug-1965 (54 y.o. M) Treating RN: Army Melia Primary Care Josey Forcier: Versie Starks, ADAM Other Clinician: Referring Kasai Beltran: Versie Starks, ADAM Treating Lorriane Dehart/Extender: Tito Dine in Treatment: 0 Height (in): 69 Weight (lbs): 165 Body Mass Index (BMI): 24.4 Nutrition Risk Screening Items Score Screening NUTRITION RISK SCREEN: I have an illness or condition that made me change the kind and/or amount of 0 No food I eat I eat fewer than two meals per day 0 No I eat few fruits and vegetables, or milk products 0 No I have three or more drinks of beer, liquor or wine almost every day 0 No I have tooth or mouth problems that make it hard for me to eat 0 No I don't always have enough money to buy the food I need 0 No I eat alone most of the time 0 No I take three or more different prescribed or over-the-counter drugs a day 0 No Without wanting to, I have lost or gained 10 pounds in the last six months 0 No I am not always physically able to shop, cook and/or feed myself 0 No Nutrition Protocols Good Risk Protocol 0 No interventions needed Moderate Risk Protocol High Risk Proctocol Risk Level: Good Risk Score: 0 Electronic Signature(s) Signed: 06/09/2019 4:03:02 PM By: Army Melia Entered By: Army Melia on 06/09/2019 14:33:14

## 2019-06-09 NOTE — Telephone Encounter (Signed)
Patient rescheduled appointment on 06/24/2019 to 07/01/2019. [facility]

## 2019-06-09 NOTE — Progress Notes (Signed)
Justin, Morrison ([id number]) Visit Report for 06/09/2019 Chief Complaint Document Details Patient Name: RHYON, Morrison Date of Service: 06/09/2019 2:15 PM Medical Record Number: [id number] Patient Account Number: [id number] Date of Birth/Sex: 1965/11/06 (54 y.o. M) Treating RN: Cornell Barman Primary Care Provider: Versie Starks, ADAM Other Clinician: Referring Provider: Versie Starks, ADAM Treating Provider/Extender: Tito Dine in Treatment: 0 Information Obtained from: Patient Chief Complaint 06/09/2019; the patient is here for review of a wound on his left anterior tibial area Electronic Signature(s) Signed: 06/09/2019 4:30:10 PM By: Linton Ham MD Entered By: Linton Ham on 06/09/2019 15:45:12 [name] ([id number]) -------------------------------------------------------------------------------- Debridement Details Patient Name: [name] Date of Service: 06/09/2019 2:15 PM Medical Record Number: [id number] Patient Account Number: [id number] Date of Birth/Sex: April 12, 1966 (54 y.o. M) Treating RN: Cornell Barman Primary Care Provider: Versie Starks, ADAM Other Clinician: Referring Provider: Versie Starks, ADAM Treating Provider/Extender: Tito Dine in Treatment: 0 Debridement Performed for Wound #1 Left,Anterior Lower Leg Assessment: Performed By: Physician Ricard Dillon, MD Debridement Type: Debridement Level of Consciousness (Pre- Awake and Alert procedure): Pre-procedure Verification/Time Yes - 14:58 Out Taken: Start Time: 14:58 Pain Control: Lidocaine Total Area Debrided (L x W): 3.7 (cm) x 1.7 (cm) = 6.29 (cm) Tissue and other material Viable, Non-Viable, Slough, Subcutaneous, Slough debrided: Level: Skin/Subcutaneous Tissue Debridement Description: Excisional Instrument: Curette Bleeding: Minimum Hemostasis Achieved: Pressure End Time: 14:59 Response to Treatment: Procedure was tolerated well Level of Consciousness Awake and Alert (Post-procedure): Post  Debridement Measurements of Total Wound Length: (cm) 3.7 Width: (cm) 1.7 Depth: (cm) 0.2 Volume: (cm) 0.988 Character of Wound/Ulcer Post Debridement: Stable Post Procedure Diagnosis Same as Pre-procedure Electronic Signature(s) Signed: 06/09/2019 4:30:10 PM By: Linton Ham MD Signed: 06/09/2019 5:27:48 PM By: Gretta Cool, BSN, RN, CWS, Kim RN, BSN Entered By: Gretta Cool, BSN, RN, CWS, Kim on 06/09/2019 14:59:32 [name] ([id number]) -------------------------------------------------------------------------------- HPI Details Patient Name: [name] Date of Service: 06/09/2019 2:15 PM Medical Record Number: [id number] Patient Account Number: [id number] Date of Birth/Sex: 1965/07/31 (54 y.o. M) Treating RN: Cornell Barman Primary Care Provider: Versie Starks, ADAM Other Clinician: Referring Provider: Versie Starks, ADAM Treating Provider/Extender: Tito Dine in Treatment: 0 History of Present Illness HPI Description: ADMISSION 06/09/2019 This is a 54 year old man who was problem started last month. He developed cellulitis in his left leg and was able to show me a picture on the phone indeed this looks like a prominent cellulitis around an open area where his wound is currently. He required admission from hot to hospital from 05/25/2019 through 05/27/2019 at which case he required an IandD with general anesthesia and IV antibiotics. He was discharged with Santyl doxycycline and trimethoprim sulfamethoxazole. Previous admission was on 05/19/2019 through 05/22/2019. A DVT rule out study on the leg was negative. He was discharged on Bactrim and Keflex but as mentioned required readmission within a week. Culture of the leg at that time showed staph epidermidis and group A strep. He has now at home putting Santyl on this wound which was prescribed by the hospital. Past medical history includes alcohol abuse, COPD, tobacco abuse, HIV ABI in our clinic was 1.33 on the left Electronic Signature(s) Signed:  06/09/2019 4:30:10 PM By: Linton Ham MD Entered By: Linton Ham on 06/09/2019 15:57:51 [name] ([id number]) -------------------------------------------------------------------------------- Physical Exam Details Patient Name: [name] Date of Service: 06/09/2019 2:15 PM Medical Record Number: [id number] Patient Account Number: [id number] Date of Birth/Sex: 07/29/1965 (54 y.o. M) Treating RN: Cornell Barman Primary Care Provider: Versie Starks, ADAM Other Clinician: Referring Provider: Versie Starks, ADAM Treating Provider/Extender:  Mykaila Blunck G Weeks in Treatment: 0 Constitutional Sitting or standing Blood Pressure is within target range for patient.. Pulse regular and within target range for patient.Marland Kitchen Respirations regular, non-labored and within target range.. Temperature is normal and within the target range for the patient.Marland Kitchen appears in no distress. Eyes Conjunctivae clear. No discharge. Cardiovascular Femoral arteries without bruits and pulses strong.. Pedal pulses palpable and strong bilaterally.. Integumentary (Hair, Skin) There is no erythema around the skin. Notes Wound exam; the area in question is on the left anterior mid tibia. Debris on the surface of the wound there is a small satellite just above it. Using a #5 curette I removed adherent necrotic debris from the wound surface. Underlying tissue actually looks quite healthy. There was no evidence of a surrounding infection Electronic Signature(s) Signed: 06/09/2019 4:30:10 PM By: Linton Ham MD Entered By: Linton Ham on 06/09/2019 16:01:32 [name] ([id number]) -------------------------------------------------------------------------------- Physician Orders Details Patient Name: [name] Date of Service: 06/09/2019 2:15 PM Medical Record Number: [id number] Patient Account Number: [id number] Date of Birth/Sex: April 29, 1965 (54 y.o. M) Treating RN: Cornell Barman Primary Care Provider: Versie Starks, ADAM Other  Clinician: Referring Provider: Versie Starks, ADAM Treating Provider/Extender: Tito Dine in Treatment: 0 Verbal / Phone Orders: No Diagnosis Coding Wound Cleansing Wound #1 Left,Anterior Lower Leg o Cleanse wound with mild soap and water o May Shower, gently pat wound dry prior to applying new dressing. Anesthetic (add to Medication List) Wound #1 Left,Anterior Lower Leg o Topical Lidocaine 4% cream applied to wound bed prior to debridement (In Clinic Only). o Benzocaine Topical Anesthetic Spray applied to wound bed prior to debridement (In Clinic Only). Primary Wound Dressing Wound #1 Left,Anterior Lower Leg o Santyl Ointment Secondary Dressing Wound #1 Left,Anterior Lower Leg o Boardered Foam Dressing Dressing Change Frequency Wound #1 Left,Anterior Lower Leg o Change Dressing Monday, Wednesday, Friday Follow-up Appointments Wound #1 Left,Anterior Lower Leg o Return Appointment in 1 week. Edema Control Wound #1 Left,Anterior Lower Leg o Elevate legs to the level of the heart and pump ankles as often as possible Medications-please add to medication list. Wound #1 Left,Anterior Lower Leg o Santyl Enzymatic Ointment Electronic Signature(s) Signed: 06/09/2019 4:30:10 PM By: Linton Ham MD Signed: 06/09/2019 5:27:48 PM By: Gretta Cool, BSN, RN, CWS, Kim RN, BSN Entered By: Gretta Cool, BSN, RN, CWS, Kim on 06/09/2019 15:02:24 [name], [name] ([id number]) [name], [name] ([id number]) -------------------------------------------------------------------------------- Problem List Details Patient Name: [name] Date of Service: 06/09/2019 2:15 PM Medical Record Number: [id number] Patient Account Number: [id number] Date of Birth/Sex: [birth year] (54 y.o. 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 M) Treating RN: Cornell Barman Primary Care Provider: Versie Starks, ADAM Other Clinician: Referring Provider: Versie Starks, ADAM Treating Provider/Extender: Tito Dine in Treatment: 0 Subjective Chief Complaint Information obtained from Patient 06/09/2019; the patient is here for review of  a wound on his left anterior tibial area History of Present Illness (HPI) ADMISSION 06/09/2019 This is a 54 year old man who was problem started last month. He developed cellulitis in his left leg and was able to show me a picture on the phone indeed this looks like a prominent cellulitis around an open area where his wound is currently. He required admission from hot to hospital from 05/25/2019 through 05/27/2019 at which case he required an IandD with general anesthesia and IV antibiotics. He was discharged with Santyl doxycycline and trimethoprim sulfamethoxazole. Previous admission was on 05/19/2019 through 05/22/2019. A DVT rule out study on the leg was negative. He was discharged on Bactrim and Keflex but as mentioned required readmission within a week. Culture of the leg at that time showed staph epidermidis and group A strep. He has now at home putting Santyl on this wound  which was prescribed by the hospital. Past medical history includes alcohol abuse, COPD, tobacco abuse, HIV ABI in our clinic was 1.33 on the left Patient History Information obtained from Patient. Allergies No Known Drug Allergies Social History Current every day smoker - 35 years, Marital Status - Single, Alcohol Use - Never, Drug Use - No History, Caffeine Use - Daily. Medical History Ear/Nose/Mouth/Throat Denies history of Chronic sinus problems/congestion, Middle ear problems Hematologic/Lymphatic Denies history of Anemia, Hemophilia, Human Immunodeficiency Virus, Lymphedema, Sickle Cell Disease Respiratory Denies history of Aspiration, Asthma, Chronic Obstructive Pulmonary Disease (COPD), Pneumothorax, Sleep Apnea, Tuberculosis Cardiovascular Patient has history of Hypertension Denies history of Angina, Arrhythmia, Congestive Heart Failure, Coronary Artery Disease, Deep Vein Thrombosis, Hypotension, Myocardial Infarction, Peripheral Arterial Disease, Peripheral Venous Disease, Phlebitis, Vasculitis Gastrointestinal Denies history of Cirrhosis , Colitis, Crohn s, Hepatitis A, Hepatitis B, Hepatitis C [name], [name] ([id number]) Endocrine Denies history of Type I Diabetes, Type II Diabetes Genitourinary Denies history of End Stage Renal Disease Immunological Denies history of Lupus Erythematosus, Raynaud s, Scleroderma Integumentary (Skin) Denies history of History of Burn, History of pressure wounds Musculoskeletal Denies history of Gout, Rheumatoid Arthritis, Osteoarthritis, Osteomyelitis Neurologic Denies history of Dementia, Neuropathy, Quadriplegia, Paraplegia, Seizure Disorder Oncologic Denies history of Received Chemotherapy, Received Radiation Psychiatric Denies history of Anorexia/bulimia, Confinement Anxiety Hospitalization/Surgery History - [facility]. Review of Systems (ROS) Constitutional Symptoms (General Health) Denies complaints or symptoms of Fatigue, Fever,  Chills, Marked Weight Change. Eyes Denies complaints or symptoms of Dry Eyes, Vision Changes, Glasses / Contacts. Ear/Nose/Mouth/Throat Denies complaints or symptoms of Difficult clearing ears, Sinusitis. Hematologic/Lymphatic Denies complaints or symptoms of Bleeding / Clotting Disorders, Human Immunodeficiency Virus. Respiratory Denies complaints or symptoms of Chronic or frequent coughs, Shortness of Breath. Cardiovascular Denies complaints or symptoms of Chest pain, LE edema. Gastrointestinal Denies complaints or symptoms of Frequent diarrhea, Nausea, Vomiting. Endocrine Denies complaints or symptoms of Hepatitis, Thyroid disease, Polydypsia (Excessive Thirst). Genitourinary Denies complaints or symptoms of Kidney failure/ Dialysis, Incontinence/dribbling. Immunological Denies complaints or symptoms of Hives, Itching. Integumentary (Skin) Complains or has symptoms of Wounds. Denies complaints or symptoms of Bleeding or bruising tendency, Breakdown, Swelling. Musculoskeletal Denies complaints or symptoms of Muscle Pain, Muscle Weakness. Neurologic Denies complaints or symptoms of Numbness/parasthesias, Focal/Weakness. Psychiatric Denies complaints or symptoms of Anxiety, Claustrophobia. Objective [name], [name] ([id number]) Constitutional Sitting or standing Blood Pressure is within target range for patient.. Pulse regular and within target range for patient.Marland Kitchen Respirations regular, non-labored and within target range.. Temperature is normal and within the target range for the patient.Marland Kitchen appears in no distress. Vitals Time Taken: 2:15 PM, Height: 69 in, Source: Stated, Weight: 165  lbs, Source: Measured, BMI: 24.4, Temperature: 98.4 F, Pulse: 66 bpm, Respiratory Rate: 16 breaths/min, Blood Pressure: 120/68 mmHg. Eyes Conjunctivae clear. No discharge. Cardiovascular Femoral arteries without bruits and pulses strong.. Pedal pulses palpable and strong bilaterally.. General Notes:  Wound exam; the area in question is on the left anterior mid tibia. Debris on the surface of the wound there is a small satellite just above it. Using a #5 curette I removed adherent necrotic debris from the wound surface. Underlying tissue actually looks quite healthy. There was no evidence of a surrounding infection Integumentary (Hair, Skin) There is no erythema around the skin. Wound #1 status is Open. Original cause of wound was Gradually Appeared. The wound is located on the Left,Anterior Lower Leg. The wound measures 3.7cm length x 1.7cm width x 0.2cm depth; 4.94cm^2 area and 0.988cm^3 volume. There is Fat Layer (Subcutaneous Tissue) Exposed exposed. There is no tunneling or undermining noted. There is a medium amount of serous drainage noted. The wound margin is flat and intact. There is medium (34-66%) pink granulation within the wound bed. There is a medium (34-66%) amount of necrotic tissue within the wound bed including Eschar and Adherent Slough. Assessment Active Problems ICD-10 Cutaneous abscess of left lower limb Cellulitis of left lower limb Non-pressure chronic ulcer of other part of left lower leg with fat layer exposed Procedures Wound #1 Pre-procedure diagnosis of Wound #1 is an Abscess located on the Left,Anterior Lower Leg . There was a Excisional Skin/Subcutaneous Tissue Debridement with a total area of 6.29 sq cm performed by Ricard Dillon, MD. With the following instrument(s): Curette to remove Viable and Non-Viable tissue/material. Material removed includes Subcutaneous Tissue and Slough and after achieving pain control using Lidocaine. No specimens were taken. A time out was conducted at 14:58, prior to the start of the procedure. A Minimum amount of bleeding was controlled with Pressure. The procedure was tolerated well. Post Debridement Measurements: 3.7cm length x 1.7cm width x 0.2cm depth; 0.988cm^3 volume. Character of Wound/Ulcer Post Debridement is  stable. Post procedure Diagnosis Wound #1: Same as Pre-Procedure [name], [name] ([id number]) Plan Wound Cleansing: Wound #1 Left,Anterior Lower Leg: Cleanse wound with mild soap and water May Shower, gently pat wound dry prior to applying new dressing. Anesthetic (add to Medication List): Wound #1 Left,Anterior Lower Leg: Topical Lidocaine 4% cream applied to wound bed prior to debridement (In Clinic Only). Benzocaine Topical Anesthetic Spray applied to wound bed prior to debridement (In Clinic Only). Primary Wound Dressing: Wound #1 Left,Anterior Lower Leg: Santyl Ointment Secondary Dressing: Wound #1 Left,Anterior Lower Leg: Boardered Foam Dressing Dressing Change Frequency: Wound #1 Left,Anterior Lower Leg: Change Dressing Monday, Wednesday, Friday Follow-up Appointments: Wound #1 Left,Anterior Lower Leg: Return Appointment in 1 week. Edema Control: Wound #1 Left,Anterior Lower Leg: Elevate legs to the level of the heart and pump ankles as often as possible Medications-please add to medication list.: Wound #1 Left,Anterior Lower Leg: Santyl Enzymatic Ointment 1. I agree with the Santyl as the primary dressing. Border foam cover or gauze changed daily 2. The evidence seems overwhelming that this was a wound caused by an infection complicated by an abscess he has had 2 IandD's in this area. The wound certainly looks better than the picture that he had on his phone which would have been from late last month 3. The patient is out on disability he says the paperwork is previously been filed 4. I do not think he will need the Santyl for that much longer. Perhaps Hydrofera Blue  next week or the week after. 5. He does not have significant PAD or venous insufficiency by our bedside exam. No edema is detected I spent 30 minutes in the review of this patient's electronic record, face-to-face evaluation of the patient and completion of this record Electronic Signature(s) Signed: 06/09/2019  4:30:10 PM By: Linton Ham MD Entered By: Linton Ham on 06/09/2019 16:04:24 [name] ([id number]) -------------------------------------------------------------------------------- ROS/PFSH Details Patient Name: [name] Date of Service: 06/09/2019 2:15 PM Medical Record Number: [id number] Patient Account Number: [id number] Date of Birth/Sex: December 06, 1965 (54 y.o. M) Treating RN: Army Melia Primary Care Provider: Versie Starks, ADAM Other Clinician: Referring Provider: Versie Starks, ADAM Treating Provider/Extender: Tito Dine in Treatment: 0 Information Obtained From Patient Constitutional Symptoms (General Health) Complaints and Symptoms: Negative for: Fatigue; Fever; Chills; Marked Weight Change Eyes Complaints and Symptoms: Negative for: Dry Eyes; Vision Changes; Glasses / Contacts Ear/Nose/Mouth/Throat Complaints and Symptoms: Negative for: Difficult clearing ears; Sinusitis Medical History: Negative for: Chronic sinus problems/congestion; Middle ear problems Hematologic/Lymphatic Complaints and Symptoms: Negative for: Bleeding / Clotting Disorders; Human Immunodeficiency Virus Medical History: Negative for: Anemia; Hemophilia; Human Immunodeficiency Virus; Lymphedema; Sickle Cell Disease Respiratory Complaints and Symptoms: Negative for: Chronic or frequent coughs; Shortness of Breath Medical History: Negative for: Aspiration; Asthma; Chronic Obstructive Pulmonary Disease (COPD); Pneumothorax; Sleep Apnea; Tuberculosis Cardiovascular Complaints and Symptoms: Negative for: Chest pain; LE edema Medical History: Positive for: Hypertension Negative for: Angina; Arrhythmia; Congestive Heart Failure; Coronary Artery Disease; Deep Vein Thrombosis; Hypotension; Myocardial Infarction; Peripheral Arterial Disease; Peripheral Venous Disease; Phlebitis; Vasculitis Gastrointestinal [name], [name] ([id number]) Complaints and Symptoms: Negative for: Frequent diarrhea;  Nausea; Vomiting Medical History: Negative for: Cirrhosis ; Colitis; Crohnos; Hepatitis A; Hepatitis B; Hepatitis C Endocrine Complaints and Symptoms: Negative for: Hepatitis; Thyroid disease; Polydypsia (Excessive Thirst) Medical History: Negative for: Type I Diabetes; Type II Diabetes Genitourinary Complaints and Symptoms: Negative for: Kidney failure/ Dialysis; Incontinence/dribbling Medical History: Negative for: End Stage Renal Disease Immunological Complaints and Symptoms: Negative for: Hives; Itching Medical History: Negative for: Lupus Erythematosus; Raynaudos; Scleroderma Integumentary (Skin) Complaints and Symptoms: Positive for: Wounds Negative for: Bleeding or bruising tendency; Breakdown; Swelling Medical History: Negative for: History of Burn; History of pressure wounds Musculoskeletal Complaints and Symptoms: Negative for: Muscle Pain; Muscle Weakness Medical History: Negative for: Gout; Rheumatoid Arthritis; Osteoarthritis; Osteomyelitis Neurologic Complaints and Symptoms: Negative for: Numbness/parasthesias; Focal/Weakness Medical History: Negative for: Dementia; Neuropathy; Quadriplegia; Paraplegia; Seizure Disorder Psychiatric Complaints and Symptoms: Negative for: Anxiety; [name], [name] ([id number]) Medical History: Negative for: Anorexia/bulimia; Confinement Anxiety Oncologic Medical History: Negative for: Received Chemotherapy; Received Radiation Immunizations Pneumococcal Vaccine: Received Pneumococcal Vaccination: No Implantable Devices None Hospitalization / Surgery History Type of Hospitalization/Surgery [facility] Family and Social History Current every day smoker - 83 years; Marital Status - Single; Alcohol Use: Never; Drug Use: No History; Caffeine Use: Daily; Financial Concerns: No; Food, Clothing or Shelter Needs: No; Support System Lacking: No; Transportation Concerns: No Electronic Signature(s) Signed: 06/09/2019 4:03:02 PM  By: Army Melia Signed: 06/09/2019 4:30:10 PM By: Linton Ham MD Entered By: Army Melia on 06/09/2019 14:32:26 [name] ([id number]) -------------------------------------------------------------------------------- SuperBill Details Patient Name: [name] Date of Service: 06/09/2019 Medical Record Number: [id number] Patient Account Number: [id number] Date of Birth/Sex: 10/04/1965 (54 y.o. M) Treating RN: Cornell Barman Primary Care Provider: Versie Starks, ADAM Other Clinician: Referring Provider: Versie Starks, ADAM Treating Provider/Extender: Tito Dine in Treatment: 0 Diagnosis Coding ICD-10 Codes Code Description L02.416 Cutaneous abscess of left lower limb L03.116 Cellulitis of left lower limb L97.822 Non-pressure chronic ulcer of  other part of left lower leg with fat layer exposed Facility Procedures CPT4 Code Description: [id number] - WOUND CARE VISIT-LEV 3 EST PT Modifier: Quantity: 1 CPT4 Code Description: [id number] 11042 - DEB SUBQ TISSUE 20 SQ CM/< ICD-10 Diagnosis Description L97.822 Non-pressure chronic ulcer of other part of left lower leg with Modifier: fat layer expos Quantity: 1 ed Physician Procedures CPT4 Code Description: [id number] WC PHYS LEVEL 3 o NEW PT ICD-10 Diagnosis Description L02.416 Cutaneous abscess of left lower limb L03.116 Cellulitis of left lower limb L97.822 Non-pressure chronic ulcer of other part of left lower leg wit Modifier: 25 h fat layer expos Quantity: 1 ed CPT4 Code Description: [id number] 11042 - WC PHYS SUBQ TISS 20 SQ CM ICD-10 Diagnosis Description L97.822 Non-pressure chronic ulcer of other part of left lower leg wit Modifier: h fat layer expos Quantity: 1 ed Electronic Signature(s) Signed: 06/09/2019 4:30:10 PM By: Linton Ham MD Entered By: Linton Ham on 06/09/2019 16:04:57

## 2019-06-10 ENCOUNTER — Ambulatory Visit: Payer: BC Managed Care – PPO | Admitting: Physician Assistant

## 2019-06-13 ENCOUNTER — Encounter: Payer: Self-pay | Admitting: *Deleted

## 2019-06-16 ENCOUNTER — Telehealth: Payer: Self-pay

## 2019-06-16 ENCOUNTER — Other Ambulatory Visit: Payer: Self-pay

## 2019-06-16 ENCOUNTER — Encounter: Payer: BC Managed Care – PPO | Admitting: Internal Medicine

## 2019-06-16 DIAGNOSIS — S81802A Unspecified open wound, left lower leg, initial encounter: Secondary | ICD-10-CM | POA: Diagnosis not present

## 2019-06-16 DIAGNOSIS — L97822 Non-pressure chronic ulcer of other part of left lower leg with fat layer exposed: Secondary | ICD-10-CM | POA: Diagnosis not present

## 2019-06-16 DIAGNOSIS — J449 Chronic obstructive pulmonary disease, unspecified: Secondary | ICD-10-CM | POA: Diagnosis not present

## 2019-06-16 DIAGNOSIS — F17200 Nicotine dependence, unspecified, uncomplicated: Secondary | ICD-10-CM | POA: Diagnosis not present

## 2019-06-16 DIAGNOSIS — I10 Essential (primary) hypertension: Secondary | ICD-10-CM | POA: Diagnosis not present

## 2019-06-16 DIAGNOSIS — Z21 Asymptomatic human immunodeficiency virus [HIV] infection status: Secondary | ICD-10-CM | POA: Diagnosis not present

## 2019-06-16 NOTE — Progress Notes (Signed)
EMAAD, DOBIAS ([id number]) Visit Report for 06/16/2019 Arrival Information Details Patient Name: [name], [name] Date of Service: 06/16/2019 1:00 PM Medical Record Number: [id number] Patient Account Number: [id number] Date of Birth/Sex: 05-Dec-1965 (54 y.o. M) Treating RN: Cornell Barman Primary Care Emanual Lamountain: Versie Starks, ADAM Other Clinician: Referring Shannelle Alguire: Versie Starks, ADAM Treating Logyn Dedominicis/Extender: Tito Dine in Treatment: 1 Visit Information History Since Last Visit Added or deleted any medications: No Patient Arrived: Ambulatory Any new allergies or adverse reactions: No Arrival Time: 12:55 Had a fall or experienced change in No Accompanied By: self activities of daily living that may affect Transfer Assistance: None risk of falls: Patient Identification Verified: Yes Signs or symptoms of abuse/neglect since last visito No Secondary Verification Process Completed: Yes Hospitalized since last visit: No Patient Requires Transmission-Based Precautions: Yes Implantable device outside of the clinic excluding No Transmission-Based Precautions: Contact HIV+ cellular tissue based products placed in the center Patient Has Alerts: Yes since last visit: Has Dressing in Place as Prescribed: Yes Pain Present Now: No Electronic Signature(s) Signed: 06/16/2019 3:06:34 PM By: Lorine Bears RCP, RRT, CHT Entered By: Lorine Bears on 06/16/2019 12:59:26 [name] ([id number]) -------------------------------------------------------------------------------- Encounter Discharge Information Details Patient Name: [name] Date of Service: 06/16/2019 1:00 PM Medical Record Number: [id number] Patient Account Number: [id number] Date of Birth/Sex: 08/16/1965 (54 y.o. M) Treating RN: Army Melia Primary Care Aksh Swart: Versie Starks, ADAM Other Clinician: Referring Taylinn Brabant: Versie Starks, ADAM Treating Chaniqua Brisby/Extender: Tito Dine in Treatment: 1 Encounter  Discharge Information Items Post Procedure Vitals Discharge Condition: Stable Temperature (F): 98.4 Ambulatory Status: Ambulatory Pulse (bpm): 71 Discharge Destination: Home Respiratory Rate (breaths/min): 16 Transportation: Private Auto Blood Pressure (mmHg): 123/71 Accompanied By: self Schedule Follow-up Appointment: No Clinical Summary of Care: Electronic Signature(s) Signed: 06/16/2019 4:52:55 PM By: Army Melia Entered By: Army Melia on 06/16/2019 13:30:21 [name] ([id number]) -------------------------------------------------------------------------------- Lower Extremity Assessment Details Patient Name: [name] Date of Service: 06/16/2019 1:00 PM Medical Record Number: [id number] Patient Account Number: [id number] Date of Birth/Sex: 10/20/1965 (54 y.o. M) Treating RN: Montey Hora Primary Care Erabella Kuipers: Versie Starks, ADAM Other Clinician: Referring Jemell Town: Versie Starks, ADAM Treating Altariq Goodall/Extender: Ricard Dillon Weeks in Treatment: 1 Edema Assessment Assessed: [Left: No] [Right: No] Edema: [Left: N] [Right: o] Vascular Assessment Pulses: Dorsalis Pedis Palpable: [Left:Yes] Electronic Signature(s) Signed: 06/16/2019 4:59:40 PM By: Montey Hora Entered By: Montey Hora on 06/16/2019 13:08:36 [name] ([id number]) -------------------------------------------------------------------------------- Multi Wound Chart Details Patient Name: [name] Date of Service: 06/16/2019 1:00 PM Medical Record Number: [id number] Patient Account Number: [id number] Date of Birth/Sex: 03-04-1966 (54 y.o. M) Treating RN: Army Melia Primary Care Cameron Schwinn: Versie Starks, ADAM Other Clinician: Referring Jakiyah Stepney: Versie Starks, ADAM Treating Emmett Bracknell/Extender: Tito Dine in Treatment: 1 Vital Signs Height(in): 69 Pulse(bpm): 70 Weight(lbs): 165 Blood Pressure(mmHg): 123/71 Body Mass Index(BMI): 24 Temperature(F): 98.4 Respiratory Rate(breaths/min): 16 Photos:  [N/A:N/A] Wound Location: Left Lower Leg - Anterior N/A N/A Wounding Event: Gradually Appeared N/A N/A Primary Etiology: Abscess N/A N/A Comorbid History: Hypertension N/A N/A Date Acquired: 05/27/2019 N/A N/A Weeks of Treatment: 1 N/A N/A Wound Status: Open N/A N/A Measurements L x W x D (cm) 2.2x1.4x0.1 N/A N/A Area (cm) : 2.419 N/A N/A Volume (cm) : 0.242 N/A N/A % Reduction in Area: 51.00% N/A N/A % Reduction in Volume: 75.50% N/A N/A Classification: Full Thickness Without Exposed N/A N/A Support Structures Exudate Amount: Medium N/A N/A Exudate Type: Serous N/A N/A Exudate Color: amber N/A N/A Wound Margin: Flat and Intact N/A N/A Granulation Amount: Medium (34-66%) N/A N/A Granulation  Quality: Pink N/A N/A Necrotic Amount: Medium (34-66%) N/A N/A Exposed Structures: Fat Layer (Subcutaneous Tissue) N/A N/A Exposed: Yes Fascia: No Tendon: No Muscle: No Joint: No Bone: No Epithelialization: None N/A N/A Debridement: Debridement - Excisional N/A N/A Pre-procedure Verification/Time 13:26 N/A N/A Out Taken: Pain Control: Lidocaine N/A N/A Tissue Debrided: Necrotic/Eschar, Subcutaneous, N/A N/A Slough Level: Skin/Subcutaneous Tissue N/A N/A Debridement Area (sq cm): 3.08 N/A N/A Instrument: Curette N/A N/A SHELVIN, DRESS ([id number]) Bleeding: Minimum N/A N/A Hemostasis Achieved: Pressure N/A N/A Debridement Treatment Procedure was tolerated well N/A N/A Response: Post Debridement Measurements 2.2x1.4x0.1 N/A N/A L x W x D (cm) Post Debridement Volume: (cm) 0.242 N/A N/A Procedures Performed: Debridement N/A N/A Treatment Notes Wound #1 (Left, Anterior Lower Leg) Notes Santyl, bordered foam Electronic Signature(s) Signed: 06/16/2019 5:01:35 PM By: Linton Ham MD Entered By: Linton Ham on 06/16/2019 13:38:17 [name] ([id number]) -------------------------------------------------------------------------------- Multi-Disciplinary Care Plan Details Patient  Name: [name] Date of Service: 06/16/2019 1:00 PM Medical Record Number: [id number] Patient Account Number: [id number] Date of Birth/Sex: 1965-06-29 (54 y.o. M) Treating RN: Army Melia Primary Care Amani Marseille: Versie Starks, ADAM Other Clinician: Referring Charan Prieto: Versie Starks, ADAM Treating Nagi Furio/Extender: Tito Dine in Treatment: 1 Active Inactive Necrotic Tissue Nursing Diagnoses: Impaired tissue integrity related to necrotic/devitalized tissue Goals: Necrotic/devitalized tissue will be minimized in the wound bed Date Initiated: 06/09/2019 Target Resolution Date: 06/23/2019 Goal Status: Active Interventions: Assess patient pain level pre-, during and post procedure and prior to discharge Treatment Activities: Excisional debridement : 06/09/2019 Notes: Orientation to the Wound Care Program Nursing Diagnoses: Knowledge deficit related to the wound healing center program Goals: Patient/caregiver will verbalize understanding of the [place] Date Initiated: 06/09/2019 Target Resolution Date: 06/30/2019 Goal Status: Active Interventions: Provide education on orientation to the wound center Notes: Wound/Skin Impairment Nursing Diagnoses: Impaired tissue integrity Goals: Patient/caregiver will verbalize understanding of skin care regimen Date Initiated: 06/09/2019 Target Resolution Date: 06/30/2019 Goal Status: Active Ulcer/skin breakdown will have a volume reduction of 30% by week 4 Date Initiated: 06/09/2019 Target Resolution Date: 06/30/2019 Goal Status: Active Interventions: Provide education on ulcer and skin care Treatment Activities: HAMDAN, BANTER ([id number]) Skin care regimen initiated : 06/09/2019 Topical wound management initiated : 06/09/2019 Notes: Electronic Signature(s) Signed: 06/16/2019 4:52:55 PM By: Army Melia Entered By: Army Melia on 06/16/2019 13:27:21 [name]  ([id number]) -------------------------------------------------------------------------------- Pain Assessment Details Patient Name: [name] Date of Service: 06/16/2019 1:00 PM Medical Record Number: [id number] Patient Account Number: [id number] Date of Birth/Sex: 01-02-1966 (54 y.o. M) Treating RN: Montey Hora Primary Care Debbra Digiulio: Versie Starks, ADAM Other Clinician: Referring Clydine Parkison: Versie Starks, ADAM Treating Shabrea Weldin/Extender: Tito Dine in Treatment: 1 Active Problems Location of Pain Severity and Description of Pain Patient Has Paino No Site Locations Pain Management and Medication Current Pain Management: Electronic Signature(s) Signed: 06/16/2019 4:59:40 PM By: Montey Hora Entered By: Montey Hora on 06/16/2019 13:05:46 [name] ([id number]) -------------------------------------------------------------------------------- Patient/Caregiver Education Details Patient Name: [name] Date of Service: 06/16/2019 1:00 PM Medical Record Number: [id number] Patient Account Number: [id number] Date of Birth/Gender: 1965-06-11 (54 y.o. M) Treating RN: Army Melia Primary Care Physician: Versie Starks, ADAM Other Clinician: Referring Physician: Versie Starks, ADAM Treating Physician/Extender: Tito Dine in Treatment: 1 Education Assessment Education Provided To: Patient Education Topics Provided Wound/Skin Impairment: Handouts: Caring for Your Ulcer Methods: Demonstration, Explain/Verbal Responses: State content correctly Electronic Signature(s) Signed: 06/16/2019 4:52:55 PM By: Army Melia Entered By: Army Melia on 06/16/2019 13:29:18 Blaize, Peterson Ao ([id number]) -------------------------------------------------------------------------------- Wound Assessment Details Patient Name: Berenice Primas,  Andrez Date of Service: 06/16/2019 1:00 PM Medical Record Number: [id number] Patient Account Number: [id number] Date of Birth/Sex: January 12, 1966 (54 y.o. M) Treating RN: Montey Hora Primary Care Yomaris Palecek: Versie Starks, ADAM Other Clinician: Referring Siler Mavis: Versie Starks, ADAM Treating Brandan Glauber/Extender: Tito Dine in Treatment: 1 Wound Status Wound Number: 1 Primary Etiology: Abscess Wound Location: Left Lower Leg - Anterior Wound Status: Open Wounding Event: Gradually Appeared Comorbid History: Hypertension Date Acquired: 05/27/2019 Weeks Of Treatment: 1 Clustered Wound: No Photos Wound Measurements Length: (cm) 2.2 Width: (cm) 1.4 Depth: (cm) 0.1 Area: (cm) 2.419 Volume: (cm) 0.242 % Reduction in Area: 51% % Reduction in Volume: 75.5% Epithelialization: None Tunneling: No Undermining: No Wound Description Classification: Full Thickness Without Exposed Support Structu Wound Margin: Flat and Intact Exudate Amount: Medium Exudate Type: Serous Exudate Color: amber res Foul Odor After Cleansing: No Slough/Fibrino Yes Wound Bed Granulation Amount: Medium (34-66%) Exposed Structure Granulation Quality: Pink Fascia Exposed: No Necrotic Amount: Medium (34-66%) Fat Layer (Subcutaneous Tissue) Exposed: Yes Necrotic Quality: Adherent Slough Tendon Exposed: No Muscle Exposed: No Joint Exposed: No Bone Exposed: No Treatment Notes Wound #1 (Left, Anterior Lower Leg) Notes Santyl, bordered foam Electronic Signature(s) Signed: 06/16/2019 4:59:40 PM By: Tad Moore, Peterson Ao ([id number]) Entered By: Montey Hora on 06/16/2019 13:07:58 [name] ([id number]) -------------------------------------------------------------------------------- Vitals Details Patient Name: [name] Date of Service: 06/16/2019 1:00 PM Medical Record Number: [id number] Patient Account Number: [id number] Date of Birth/Sex: 29-May-1965 (54 y.o. M) Treating RN: Cornell Barman Primary Care Eber Ferrufino: Versie Starks, ADAM Other Clinician: Referring Broedy Osbourne: Versie Starks, ADAM Treating Kobie Whidby/Extender: Tito Dine in Treatment: 1 Vital Signs Time Taken:  13:00 Temperature (F): 98.4 Height (in): 69 Pulse (bpm): 71 Weight (lbs): 165 Respiratory Rate (breaths/min): 16 Body Mass Index (BMI): 24.4 Blood Pressure (mmHg): 123/71 Reference Range: 80 - 120 mg / dl Electronic Signature(s) Signed: 06/16/2019 3:06:34 PM By: Lorine Bears RCP, RRT, CHT Entered By: Lorine Bears on 06/16/2019 13:02:33

## 2019-06-16 NOTE — Telephone Encounter (Signed)
Left a message advising pt paperwork ready for pick up. [name]

## 2019-06-23 ENCOUNTER — Encounter: Payer: BC Managed Care – PPO | Attending: Internal Medicine | Admitting: Internal Medicine

## 2019-06-23 ENCOUNTER — Other Ambulatory Visit: Payer: Self-pay

## 2019-06-23 DIAGNOSIS — J449 Chronic obstructive pulmonary disease, unspecified: Secondary | ICD-10-CM | POA: Insufficient documentation

## 2019-06-23 DIAGNOSIS — L03116 Cellulitis of left lower limb: Secondary | ICD-10-CM | POA: Diagnosis not present

## 2019-06-23 DIAGNOSIS — L97822 Non-pressure chronic ulcer of other part of left lower leg with fat layer exposed: Secondary | ICD-10-CM | POA: Diagnosis not present

## 2019-06-23 DIAGNOSIS — L02416 Cutaneous abscess of left lower limb: Secondary | ICD-10-CM | POA: Diagnosis not present

## 2019-06-23 DIAGNOSIS — I10 Essential (primary) hypertension: Secondary | ICD-10-CM | POA: Diagnosis not present

## 2019-06-23 DIAGNOSIS — Z21 Asymptomatic human immunodeficiency virus [HIV] infection status: Secondary | ICD-10-CM | POA: Insufficient documentation

## 2019-06-23 NOTE — Progress Notes (Signed)
ARTHURO, NOETZEL ([id number]) Visit Report for 06/23/2019 HPI Details Patient Name: [name], [name] Date of Service: 06/23/2019 10:30 AM Medical Record Number: [id number] Patient Account Number: [id number] Date of Birth/Sex: May 24, 1965 (54 y.o. M) Treating RN: Cornell Barman Primary Care Provider: Versie Starks, ADAM Other Clinician: Referring Provider: Versie Starks, ADAM Treating Provider/Extender: Tito Dine in Treatment: 2 History of Present Illness HPI Description: ADMISSION 06/09/2019 This is a 54 year old man who was problem started last month. He developed cellulitis in his left leg and was able to show me a picture on the phone indeed this looks like a prominent cellulitis around an open area where his wound is currently. He required admission from hot to hospital from 05/25/2019 through 05/27/2019 at which case he required an IandD with general anesthesia and IV antibiotics. He was discharged with Santyl doxycycline and trimethoprim sulfamethoxazole. Previous admission was on 05/19/2019 through 05/22/2019. A DVT rule out study on the leg was negative. He was discharged on Bactrim and Keflex but as mentioned required readmission within a week. Culture of the leg at that time showed staph epidermidis and group A strep. He has now at home putting Santyl on this wound which was prescribed by the hospital. Past medical history includes alcohol abuse, COPD, tobacco abuse, HIV ABI in our clinic was 1.33 on the left 2/24; left anterior lower leg. Wound measures smaller. Rims of epithelialization still some debris on the surface he is using Santyl with a thick Band-Aid cover. He is back at work as of yesterday 3/3; left anterior lower leg wound. Wound wound measures smaller. Rims of epithelialization. Only a tiny open area that is not fully epithelialized remains. We have been using Santyl I do not think there is any good reason to continue this we will put Hydrofera Blue on this to try and close the  soft today Electronic Signature(s) Signed: 06/23/2019 4:59:37 PM By: Linton Ham MD Entered By: Linton Ham on 06/23/2019 10:52:30 [name] ([id number]) -------------------------------------------------------------------------------- Physical Exam Details Patient Name: [name] Date of Service: 06/23/2019 10:30 AM Medical Record Number: [id number] Patient Account Number: [id number] Date of Birth/Sex: 02/16/1966 (54 y.o. M) Treating RN: Cornell Barman Primary Care Provider: Versie Starks, ADAM Other Clinician: Referring Provider: Versie Starks, ADAM Treating Provider/Extender: Tito Dine in Treatment: 2 Cardiovascular We have good pedal pulses and no evidence of uncontrolled edema around the wound surface. Notes Wound exam; the area questions on the left anterior mid tibia. No debris on the surface he does not require debridement. The vast majority of this is epithelialized no evidence of surrounding infection Electronic Signature(s) Signed: 06/23/2019 4:59:37 PM By: Linton Ham MD Entered By: Linton Ham on 06/23/2019 10:53:12 [name] ([id number]) -------------------------------------------------------------------------------- Physician Orders Details Patient Name: [name] Date of Service: 06/23/2019 10:30 AM Medical Record Number: [id number] Patient Account Number: [id number] Date of Birth/Sex: 1966-01-21 (54 y.o. M) Treating RN: Cornell Barman Primary Care Provider: Versie Starks, ADAM Other Clinician: Referring Provider: Versie Starks, ADAM Treating Provider/Extender: Tito Dine in Treatment: 2 Verbal / Phone Orders: No Diagnosis Coding Wound Cleansing Wound #1 Left,Anterior Lower Leg o Cleanse wound with mild soap and water Anesthetic (add to Medication List) Wound #1 Left,Anterior Lower Leg o Topical Lidocaine 4% cream applied to wound bed prior to debridement (In Clinic Only). Primary Wound Dressing Wound #1 Left,Anterior Lower Leg o Hydrafera  Blue Ready Transfer Secondary Dressing Wound #1 Left,Anterior Lower Leg o Boardered Foam Dressing Dressing Change Frequency Wound #1 Left,Anterior Lower Leg o Change dressing every other day. Follow-up Appointments Wound #  1 Left,Anterior Lower Leg o Return Appointment in 1 week. Electronic Signature(s) Signed: 06/23/2019 4:40:39 PM By: Gretta Cool, BSN, RN, CWS, Kim RN, BSN Signed: 06/23/2019 4:59:37 PM By: Linton Ham MD Entered By: Gretta Cool, BSN, RN, CWS, Kim on 06/23/2019 10:49:00 [name] ([id number]) -------------------------------------------------------------------------------- Problem List Details Patient Name: [name] Date of Service: 06/23/2019 10:30 AM Medical Record Number: [id number] Patient Account Number: [id number] Date of Birth/Sex: 1965-12-31 (54 y.o. M) Treating RN: Cornell Barman Primary Care Provider: Versie Starks, ADAM Other Clinician: Referring Provider: Versie Starks, ADAM Treating Provider/Extender: Tito Dine in Treatment: 2 Active Problems ICD-10 Evaluated Encounter Code Description Active Date Today Diagnosis L02.416 Cutaneous abscess of left lower limb 06/09/2019 No Yes L03.116 Cellulitis of left lower limb 06/09/2019 No Yes L97.822 Non-pressure chronic ulcer of other part of left lower leg with fat layer 06/09/2019 No Yes exposed Inactive Problems Resolved Problems Electronic Signature(s) Signed: 06/23/2019 4:59:37 PM By: Linton Ham MD Entered By: Linton Ham on 06/23/2019 10:51:43 [name] ([id number]) -------------------------------------------------------------------------------- Progress Note Details Patient Name: [name] Date of Service: 06/23/2019 10:30 AM Medical Record Number: [id number] Patient Account Number: [id number] Date of Birth/Sex: 1966-03-21 (54 y.o. M) Treating RN: Cornell Barman Primary Care Provider: Versie Starks, ADAM Other Clinician: Referring Provider: Versie Starks, ADAM Treating Provider/Extender: Tito Dine  in Treatment: 2 Subjective History of Present Illness (HPI) ADMISSION 06/09/2019 This is a 54 year old man who was problem started last month. He developed cellulitis in his left leg and was able to show me a picture on the phone indeed this looks like a prominent cellulitis around an open area where his wound is currently. He required admission from hot to hospital from 05/25/2019 through 05/27/2019 at which case he required an IandD with general anesthesia and IV antibiotics. He was discharged with Santyl doxycycline and trimethoprim sulfamethoxazole. Previous admission was on 05/19/2019 through 05/22/2019. A DVT rule out study on the leg was negative. He was discharged on Bactrim and Keflex but as mentioned required readmission within a week. Culture of the leg at that time showed staph epidermidis and group A strep. He has now at home putting Santyl on this wound which was prescribed by the hospital. Past medical history includes alcohol abuse, COPD, tobacco abuse, HIV ABI in our clinic was 1.33 on the left 2/24; left anterior lower leg. Wound measures smaller. Rims of epithelialization still some debris on the surface he is using Santyl with a thick Band-Aid cover. He is back at work as of yesterday 3/3; left anterior lower leg wound. Wound wound measures smaller. Rims of epithelialization. Only a tiny open area that is not fully epithelialized remains. We have been using Santyl I do not think there is any good reason to continue this we will put Hydrofera Blue on this to try and close the soft today Objective Constitutional Vitals Time Taken: 10:35 AM, Height: 69 in, Weight: 165 lbs, BMI: 24.4, Temperature: 98.2 F, Pulse: 80 bpm, Respiratory Rate: 16 breaths/min, Blood Pressure: 130/70 mmHg. Cardiovascular We have good pedal pulses and no evidence of uncontrolled edema around the wound surface. General Notes: Wound exam; the area questions on the left anterior mid tibia. No debris on the  surface he does not require debridement. The vast majority of this is epithelialized no evidence of surrounding infection Integumentary (Hair, Skin) Wound #1 status is Open. Original cause of wound was Gradually Appeared. The wound is located on the Left,Anterior Lower Leg. The wound measures 1.2cm length x 0.5cm width x 0.1cm depth; 0.471cm^2 area and 0.047cm^3  volume. There is Fat Layer (Subcutaneous Tissue) Exposed exposed. There is no tunneling or undermining noted. There is a medium amount of serous drainage noted. The wound margin is flat and intact. There is medium (34-66%) pink granulation within the wound bed. There is a medium (34-66%) amount of necrotic tissue within the wound bed including Eschar. Assessment Active Problems ICD-10 Cutaneous abscess of left lower limb Almeda, Dabney ([id number]) Cellulitis of left lower limb Non-pressure chronic ulcer of other part of left lower leg with fat layer exposed Plan Wound Cleansing: Wound #1 Left,Anterior Lower Leg: Cleanse wound with mild soap and water Anesthetic (add to Medication List): Wound #1 Left,Anterior Lower Leg: Topical Lidocaine 4% cream applied to wound bed prior to debridement (In Clinic Only). Primary Wound Dressing: Wound #1 Left,Anterior Lower Leg: Hydrafera Blue Ready Transfer Secondary Dressing: Wound #1 Left,Anterior Lower Leg: Boardered Foam Dressing Dressing Change Frequency: Wound #1 Left,Anterior Lower Leg: Change dressing every other day. Follow-up Appointments: Wound #1 Left,Anterior Lower Leg: Return Appointment in 1 week. 1. Change the primary dressing from Santyl to [facility]. There was no reason for any ongoing debridement from Santyl. 2. This should be closed by next week Electronic Signature(s) Signed: 06/23/2019 4:59:37 PM By: Linton Ham MD Entered By: Linton Ham on 06/23/2019 10:55:03 [name]  ([id number]) -------------------------------------------------------------------------------- SuperBill Details Patient Name: [name] Date of Service: 06/23/2019 Medical Record Number: [id number] Patient Account Number: [id number] Date of Birth/Sex: March 05, 1966 (54 y.o. M) Treating RN: Cornell Barman Primary Care Provider: Versie Starks, ADAM Other Clinician: Referring Provider: Versie Starks, ADAM Treating Provider/Extender: Tito Dine in Treatment: 2 Diagnosis Coding ICD-10 Codes Code Description L02.416 Cutaneous abscess of left lower limb L03.116 Cellulitis of left lower limb L97.822 Non-pressure chronic ulcer of other part of left lower leg with fat layer exposed Facility Procedures CPT4 Code: [id number] Description: 99213 - WOUND CARE VISIT-LEV 3 EST PT Modifier: Quantity: 1 Physician Procedures CPT4 Code: [id number] Description: [id number] - WC PHYS LEVEL 3 - EST PT Modifier: Quantity: 1 CPT4 Code: Description: ICD-10 Diagnosis Description L02.416 Cutaneous abscess of left lower limb L03.116 Cellulitis of left lower limb L97.822 Non-pressure chronic ulcer of other part of left lower leg with fat layer e Modifier: xposed Quantity: Electronic Signature(s) Signed: 06/23/2019 4:59:37 PM By: Linton Ham MD Entered By: Linton Ham on 06/23/2019 10:54:28

## 2019-06-23 NOTE — Progress Notes (Signed)
[name], [name] ([id number]) Visit Report for 06/23/2019 Arrival Information Details Patient Name: [name], [name] Date of Service: 06/23/2019 10:30 AM Medical Record Number: [id number] Patient Account Number: [id number] Date of Birth/Sex: Mar 05, 1966 (54 y.o. M) Treating RN: [name] Primary Care [name]: [name], [name] Other Clinician: Referring [name]: [name], [name] Treating [name]/Extender: [name] in Treatment: 2 Visit Information History Since Last Visit Added or deleted any medications: No Patient Arrived: Ambulatory Any new allergies or adverse reactions: No Arrival Time: 10:35 Had a fall or experienced change in No Accompanied By: self activities of daily living that may affect Transfer Assistance: None risk of falls: Patient Identification Verified: Yes Signs or symptoms of abuse/neglect since last visito No Patient Requires Transmission-Based Precautions: Yes Hospitalized since last visit: No Transmission-Based Precautions: Contact HIV+ Has Dressing in Place as Prescribed: Yes Patient Has Alerts: Yes Pain Present Now: No Electronic Signature(s) Signed: 06/23/2019 11:20:56 AM By: [name] Entered By: [name] on 06/23/2019 10:35:36 [name] ([id number]) -------------------------------------------------------------------------------- Clinic Level of Care Assessment Details Patient Name: [name] Date of Service: 06/23/2019 10:30 AM Medical Record Number: [id number] Patient Account Number: [id number] Date of Birth/Sex: [birth year] (53 y.o. M) Treating RN: [name] Primary Care [name]: [name], [name] Other Clinician: Referring [name]: [name], [name] Treating [name]/Extender: [name] in Treatment: 2 Clinic Level of Care Assessment Items TOOL 4 Quantity Score []  - Use when only an EandM is performed on FOLLOW-UP visit 0 ASSESSMENTS - Nursing Assessment / Reassessment X - Reassessment of Co-morbidities (includes updates in  patient status) 1 10 X- 1 5 Reassessment of Adherence to Treatment Plan ASSESSMENTS - Wound and Skin Assessment / Reassessment X - Simple Wound Assessment / Reassessment - one wound 1 5 []  - 0 Complex Wound Assessment / Reassessment - multiple wounds []  - 0 Dermatologic / Skin Assessment (not related to wound area) ASSESSMENTS - Focused Assessment []  - Circumferential Edema Measurements - multi extremities 0 []  - 0 Nutritional Assessment / Counseling / Intervention []  - 0 Lower Extremity Assessment (monofilament, tuning fork, pulses) []  - 0 Peripheral Arterial Disease Assessment (using hand held doppler) ASSESSMENTS - Ostomy and/or Continence Assessment and Care []  - Incontinence Assessment and Management 0 []  - 0 Ostomy Care Assessment and Management (repouching, etc.) PROCESS - Coordination of Care X - Simple Patient / Family Education for ongoing care 1 15 []  - 0 Complex (extensive) Patient / Family Education for ongoing care []  - 0 Staff obtains Programmer, systems, Records, Test Results / Process Orders []  - 0 Staff telephones HHA, Nursing Homes / Clarify orders / etc []  - 0 Routine Transfer to another Facility (non-emergent condition) []  - 0 Routine Hospital Admission (non-emergent condition) []  - 0 New Admissions / Biomedical engineer / Ordering NPWT, Apligraf, etc. []  - 0 Emergency Hospital Admission (emergent condition) X- 1 10 Simple Discharge Coordination []  - 0 Complex (extensive) Discharge Coordination PROCESS - Special Needs []  - Pediatric / Minor Patient Management 0 []  - 0 Isolation Patient Management []  - 0 Hearing / Language / Visual special needs []  - 0 Assessment of Community assistance (transportation, D/C planning, etc.) Deschler, [name] ([id number]) []  - 0 Additional assistance / Altered mentation []  - 0 Support Surface(s) Assessment (bed, cushion, seat, etc.) INTERVENTIONS - Wound Cleansing / Measurement X - Simple Wound Cleansing - one wound 1 5 []   - 0 Complex Wound Cleansing - multiple wounds X- 1 5 Wound Imaging (photographs - any number of wounds) []  - 0 Wound Tracing (instead of photographs) X- 1  5 Simple Wound Measurement - one wound []  - 0 Complex Wound Measurement - multiple wounds INTERVENTIONS - Wound Dressings []  - Small Wound Dressing one or multiple wounds 0 X- 1 15 Medium Wound Dressing one or multiple wounds []  - 0 Large Wound Dressing one or multiple wounds []  - 0 Application of Medications - topical []  - 0 Application of Medications - injection INTERVENTIONS - Miscellaneous []  - External ear exam 0 []  - 0 Specimen Collection (cultures, biopsies, blood, body fluids, etc.) []  - 0 Specimen(s) / Culture(s) sent or taken to Lab for analysis []  - 0 Patient Transfer (multiple staff / Civil Service fast streamer / Similar devices) []  - 0 Simple Staple / Suture removal (25 or less) []  - 0 Complex Staple / Suture removal (26 or more) []  - 0 Hypo / Hyperglycemic Management (close monitor of Blood Glucose) []  - 0 Ankle / Brachial Index (ABI) - do not check if billed separately X- 1 5 Vital Signs Has the patient been seen at the hospital within the last three years: Yes Total Score: 80 Level Of Care: New/Established - Level 3 Electronic Signature(s) Signed: 06/23/2019 4:40:39 PM By: [name], BSN, RN, CWS, Kim RN, BSN Entered By: [name], BSN, RN, CWS, [name] on 06/23/2019 10:49:43 [name] ([id number]) -------------------------------------------------------------------------------- Encounter Discharge Information Details Patient Name: [name] Date of Service: 06/23/2019 10:30 AM Medical Record Number: [id number] Patient Account Number: [id number] Date of Birth/Sex: [birth year] (53 y.o. M) Treating RN: [name] Primary Care Kaiyu Mirabal: [name], [name] Other Clinician: Referring Damonica Chopra: [name], [name] Treating Ran Tullis/Extender: [name] in Treatment: 2 Encounter Discharge Information Items Discharge Condition:  Stable Ambulatory Status: Ambulatory Discharge Destination: Home Transportation: Private Auto Accompanied By: self Schedule Follow-up Appointment: Yes Clinical Summary of Care: Electronic Signature(s) Signed: 06/23/2019 4:40:39 PM By: [name], BSN, RN, CWS, Kim RN, BSN Entered By: [name], BSN, RN, CWS, [name] on 06/23/2019 10:51:30 [name] ([id number]) -------------------------------------------------------------------------------- Lower Extremity Assessment Details Patient Name: [name] Date of Service: 06/23/2019 10:30 AM Medical Record Number: [id number] Patient Account Number: [id number] Date of Birth/Sex: 07/23/1965 (54 y.o. M) Treating RN: [name] Primary Care Annielee Jemmott: [name], [name] Other Clinician: Referring Aysha Livecchi: [name], [name] Treating Samba Cumba/Extender: Ricard Dillon Weeks in Treatment: 2 Edema Assessment Assessed: [Left: No] [Right: No] Edema: [Left: N] [Right: o] Vascular Assessment Pulses: Dorsalis Pedis Palpable: [Left:Yes] Electronic Signature(s) Signed: 06/23/2019 11:20:56 AM By: [name] Entered By: [name] on 06/23/2019 10:39:52 [name] ([id number]) -------------------------------------------------------------------------------- Multi Wound Chart Details Patient Name: [name] Date of Service: 06/23/2019 10:30 AM Medical Record Number: [id number] Patient Account Number: [id number] Date of Birth/Sex: 1966/01/03 (54 y.o. M) Treating RN: [name] Primary Care Samani Deal: [name], [name] Other Clinician: Referring Latonyia Lopata: [name], [name] Treating Trase Bunda/Extender: [name] in Treatment: 2 Vital Signs Height(in): 69 Pulse(bpm): 80 Weight(lbs): 165 Blood Pressure(mmHg): 130/70 Body Mass Index(BMI): 24 Temperature(F): 98.2 Respiratory Rate(breaths/min): 16 Photos: [N/A:N/A] Wound Location: Left Lower Leg - Anterior N/A N/A Wounding Event: Gradually Appeared N/A N/A Primary Etiology: Abscess N/A N/A Comorbid History:  Hypertension N/A N/A Date Acquired: 05/27/2019 N/A N/A Weeks of Treatment: 2 N/A N/A Wound Status: Open N/A N/A Measurements L x W x D (cm) 1.2x0.5x0.1 N/A N/A Area (cm) : 0.471 N/A N/A Volume (cm) : 0.047 N/A N/A % Reduction in Area: 90.50% N/A N/A % Reduction in Volume: 95.20% N/A N/A Classification: Full Thickness Without Exposed N/A N/A Support Structures Exudate Amount: Medium N/A N/A Exudate Type: Serous N/A N/A Exudate Color: amber N/A N/A Wound Margin: Flat and  Intact N/A N/A Granulation Amount: Medium (34-66%) N/A N/A Granulation Quality: Pink N/A N/A Necrotic Amount: Medium (34-66%) N/A N/A Necrotic Tissue: Eschar N/A N/A Exposed Structures: Fat Layer (Subcutaneous Tissue) N/A N/A Exposed: Yes Fascia: No Tendon: No Muscle: No Joint: No Bone: No Epithelialization: Medium (34-66%) N/A N/A Treatment Notes Wound #1 (Left, Anterior Lower Leg) Notes Hydrofera Blue, bordered foam MOHID, SYDOW ([id number]) Electronic Signature(s) Signed: 06/23/2019 4:59:37 PM By: Linton Ham MD Entered By: Linton Ham on 06/23/2019 10:51:49 [name] ([id number]) -------------------------------------------------------------------------------- Multi-Disciplinary Care Plan Details Patient Name: [name] Date of Service: 06/23/2019 10:30 AM Medical Record Number: [id number] Patient Account Number: [id number] Date of Birth/Sex: [birth year] (54 y.o. M) Treating RN: [name] Primary Care Wilhemenia Camba: [name], [name] Other Clinician: Referring Aloria Looper: [name], [name] Treating Lorrie Strauch/Extender: [name] in Treatment: 2 Active Inactive Necrotic Tissue Nursing Diagnoses: Impaired tissue integrity related to necrotic/devitalized tissue Goals: Necrotic/devitalized tissue will be minimized in the wound bed Date Initiated: 06/09/2019 Target Resolution Date: 06/23/2019 Goal Status: Active Interventions: Assess patient pain level pre-, during and post procedure and prior to  discharge Treatment Activities: Excisional debridement : 06/09/2019 Notes: Orientation to the Wound Care Program Nursing Diagnoses: Knowledge deficit related to the wound healing center program Goals: Patient/caregiver will verbalize understanding of the [place] Date Initiated: 06/09/2019 Target Resolution Date: 06/30/2019 Goal Status: Active Interventions: Provide education on orientation to the wound center Notes: Wound/Skin Impairment Nursing Diagnoses: Impaired tissue integrity Goals: Patient/caregiver will verbalize understanding of skin care regimen Date Initiated: 06/09/2019 Target Resolution Date: 06/30/2019 Goal Status: Active Ulcer/skin breakdown will have a volume reduction of 30% by week 4 Date Initiated: 06/09/2019 Target Resolution Date: 06/30/2019 Goal Status: Active Interventions: Provide education on ulcer and skin care Treatment Activities: REDELL, FRYAR ([id number]) Skin care regimen initiated : 06/09/2019 Topical wound management initiated : 06/09/2019 Notes: Electronic Signature(s) Signed: 06/23/2019 4:40:39 PM By: [name], BSN, RN, CWS, Kim RN, BSN Entered By: [name], BSN, RN, CWS, [name] on 06/23/2019 10:47:24 [name] ([id number]) -------------------------------------------------------------------------------- Pain Assessment Details Patient Name: [name] Date of Service: 06/23/2019 10:30 AM Medical Record Number: [id number] Patient Account Number: [id number] Date of Birth/Sex: 09-20-1965 (53 y.o. M) Treating RN: [name] Primary Care Bartosz Luginbill: [name], [name] Other Clinician: Referring Marciano Mundt: [name], [name] Treating Fardowsa Authier/Extender: [name] in Treatment: 2 Active Problems Location of Pain Severity and Description of Pain Patient Has Paino No Site Locations Pain Management and Medication Current Pain Management: Electronic Signature(s) Signed: 06/23/2019 11:20:56 AM By: [name] Entered By: [name] on  06/23/2019 10:36:07 [name] ([id number]) -------------------------------------------------------------------------------- Patient/Caregiver Education Details Patient Name: [name] Date of Service: 06/23/2019 10:30 AM Medical Record Number: [id number] Patient Account Number: [id number] Date of Birth/Gender: July 10, 1965 (54 y.o. M) Treating RN: [name] Primary Care Physician: [name], [name] Other Clinician: Referring Physician: Versie Morrison, [name] Treating Physician/Extender: [name] in Treatment: 2 Education Assessment Education Provided To: Patient Education Topics Provided Wound/Skin Impairment: Handouts: Caring for Your Ulcer Methods: Demonstration, Explain/Verbal Responses: State content correctly Electronic Signature(s) Signed: 06/23/2019 4:40:39 PM By: [name], BSN, RN, CWS, Kim RN, BSN Entered By: [name], BSN, RN, CWS, [name] on 06/23/2019 10:50:04 [name] ([id number]) -------------------------------------------------------------------------------- Wound Assessment Details Patient Name: [name] Date of Service: 06/23/2019 10:30 AM Medical Record Number: [id number] Patient Account Number: [id number] Date of Birth/Sex: [birth year] (53 y.o. M) Treating RN: [name] Primary Care Pualani Borah: [name], [name] Other Clinician: Referring Milley Vining: [name], [name] Treating Rage Beever/Extender: [name] in Treatment: 2 Wound Status Wound Number:  1 Primary Etiology: Abscess Wound Location: Left Lower Leg - Anterior Wound Status: Open Wounding Event: Gradually Appeared Comorbid History: Hypertension Date Acquired: 05/27/2019 Weeks Of Treatment: 2 Clustered Wound: No Photos Wound Measurements Length: (cm) 1.2 Width: (cm) 0.5 Depth: (cm) 0.1 Area: (cm) 0.471 Volume: (cm) 0.047 % Reduction in Area: 90.5% % Reduction in Volume: 95.2% Epithelialization: Medium (34-66%) Tunneling: No Undermining: No Wound Description Classification: Full Thickness  Without Exposed Support Structu Wound Margin: Flat and Intact Exudate Amount: Medium Exudate Type: Serous Exudate Color: amber res Foul Odor After Cleansing: No Slough/Fibrino Yes Wound Bed Granulation Amount: Medium (34-66%) Exposed Structure Granulation Quality: Pink Fascia Exposed: No Necrotic Amount: Medium (34-66%) Fat Layer (Subcutaneous Tissue) Exposed: Yes Necrotic Quality: Eschar Tendon Exposed: No Muscle Exposed: No Joint Exposed: No Bone Exposed: No Treatment Notes Wound #1 (Left, Anterior Lower Leg) Notes Hydrofera Blue, bordered foam Electronic Signature(s) Signed: 06/23/2019 11:20:56 AM By: Patton Salles, Peterson Ao ([id number]) Entered By: [name] on 06/23/2019 10:39:00 [name] ([id number]) -------------------------------------------------------------------------------- Vitals Details Patient Name: [name] Date of Service: 06/23/2019 10:30 AM Medical Record Number: [id number] Patient Account Number: [id number] Date of Birth/Sex: 12/13/1965 (54 y.o. M) Treating RN: [name] Primary Care Luella Gardenhire: [name], [name] Other Clinician: Referring Doll Frazee: [name], [name] Treating Chalisa Kobler/Extender: [name] in Treatment: 2 Vital Signs Time Taken: 10:35 Temperature (F): 98.2 Height (in): 69 Pulse (bpm): 80 Weight (lbs): 165 Respiratory Rate (breaths/min): 16 Body Mass Index (BMI): 24.4 Blood Pressure (mmHg): 130/70 Reference Range: 80 - 120 mg / dl Electronic Signature(s) Signed: 06/23/2019 11:20:56 AM By: [name] Entered By: [name] on 06/23/2019 10:36:18

## 2019-06-24 ENCOUNTER — Ambulatory Visit: Payer: BC Managed Care – PPO | Admitting: Adult Health

## 2019-06-25 NOTE — Progress Notes (Signed)
ACEA, HOILAND ([id number]) Visit Report for 06/16/2019 Debridement Details Patient Name: [name], [name] Date of Service: 06/16/2019 1:00 PM Medical Record Number: [id number] Patient Account Number: [id number] Date of Birth/Sex: 1966/02/02 (54 y.o. M) Treating RN: Cornell Barman Primary Care Provider: Versie Starks, ADAM Other Clinician: Referring Provider: Versie Starks, ADAM Treating Provider/Extender: Tito Dine in Treatment: 1 Debridement Performed for Wound #1 Left,Anterior Lower Leg Assessment: Performed By: Physician Ricard Dillon, MD Debridement Type: Debridement Level of Consciousness (Pre- Awake and Alert procedure): Pre-procedure Verification/Time Out Yes - 13:26 Taken: Start Time: 13:27 Pain Control: Lidocaine Total Area Debrided (L x W): 2.2 (cm) x 1.4 (cm) = 3.08 (cm) Tissue and other material debrided: Viable, Non-Viable, Eschar, Slough, Subcutaneous, Slough Level: Skin/Subcutaneous Tissue Debridement Description: Excisional Instrument: Curette Bleeding: Minimum Hemostasis Achieved: Pressure End Time: 13:28 Response to Treatment: Procedure was tolerated well Level of Consciousness (Post- Awake and Alert procedure): Post Debridement Measurements of Total Wound Length: (cm) 2.2 Width: (cm) 1.4 Depth: (cm) 0.1 Volume: (cm) 0.242 Character of Wound/Ulcer Post Debridement: Stable Post Procedure Diagnosis Same as Pre-procedure Electronic Signature(s) Signed: 06/16/2019 5:01:35 PM By: Linton Ham MD Signed: 06/25/2019 5:40:44 PM By: Gretta Cool, BSN, RN, CWS, Kim RN, BSN Entered By: Linton Ham on 06/16/2019 13:38:31 [name] ([id number]) -------------------------------------------------------------------------------- HPI Details Patient Name: [name] Date of Service: 06/16/2019 1:00 PM Medical Record Number: [id number] Patient Account Number: [id number] Date of Birth/Sex: 1965-09-02 (54 y.o. M) Treating RN: Cornell Barman Primary Care Provider: Versie Starks, ADAM  Other Clinician: Referring Provider: Versie Starks, ADAM Treating Provider/Extender: Tito Dine in Treatment: 1 History of Present Illness HPI Description: ADMISSION 06/09/2019 This is a 54 year old man who was problem started last month. He developed cellulitis in his left leg and was able to show me a picture on the phone indeed this looks like a prominent cellulitis around an open area where his wound is currently. He required admission from hot to hospital from 05/25/2019 through 05/27/2019 at which case he required an IandD with general anesthesia and IV antibiotics. He was discharged with Santyl doxycycline and trimethoprim sulfamethoxazole. Previous admission was on 05/19/2019 through 05/22/2019. A DVT rule out study on the leg was negative. He was discharged on Bactrim and Keflex but as mentioned required readmission within a week. Culture of the leg at that time showed staph epidermidis and group A strep. He has now at home putting Santyl on this wound which was prescribed by the hospital. Past medical history includes alcohol abuse, COPD, tobacco abuse, HIV ABI in our clinic was 1.33 on the left 2/24; left anterior lower leg. Wound measures smaller. Rims of epithelialization still some debris on the surface he is using Santyl with a thick Band-Aid cover. 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 M) Treating RN: Cornell Barman Primary Care Provider: Versie Starks, ADAM Other Clinician: Referring Provider: Versie Starks, ADAM Treating Provider/Extender: Tito Dine in  Treatment: 1 Notes Wound exam; the area in question is on the left anterior mid tibia. Debris on the surface of the wound removed with a #3 curette. He has a nice rim of epithelialization the rest of the wound looks healthy. He expressed concerns about streaking on his leg but I see no evidence of infection. I  think this comes from his original admission to hospital with infection Electronic Signature(s) Signed: 06/16/2019 5:01:35 PM By: Linton Ham MD Entered By: Linton Ham on 06/16/2019 13:39:41 [name] ([id number]) -------------------------------------------------------------------------------- Physician Orders Details Patient Name: [name] Date of Service: 06/16/2019 1:00 PM Medical Record Number: [id number] Patient Account Number: [id number] Date of Birth/Sex: 1965/05/27 (54 y.o. M) Treating RN: Army Melia Primary Care Provider: Versie Starks, ADAM Other Clinician: Referring Provider: Versie Starks, ADAM Treating Provider/Extender: Tito Dine in Treatment: 1 Verbal / Phone Orders: No Diagnosis Coding Wound Cleansing Wound #1 Left,Anterior Lower Leg o Cleanse wound with mild soap and water o May Shower, gently pat wound dry prior to applying new dressing. Anesthetic (add to Medication List) Wound #1 Left,Anterior Lower Leg o Topical Lidocaine 4% cream applied to wound bed prior to debridement (In Clinic Only). o Benzocaine Topical Anesthetic Spray applied to wound bed prior to debridement (In Clinic Only). Primary Wound Dressing Wound #1 Left,Anterior Lower Leg o Santyl Ointment Secondary Dressing Wound #1 Left,Anterior Lower Leg o Boardered Foam Dressing Dressing Change Frequency Wound #1 Left,Anterior Lower Leg o Change Dressing Monday, Wednesday, Friday Follow-up Appointments Wound #1 Left,Anterior Lower Leg o Return Appointment in 1 week. Edema Control Wound #1 Left,Anterior Lower Leg o Elevate legs to the level of the heart and  pump ankles as often as possible Medications-please add to medication list. Wound #1 Left,Anterior Lower Leg o Santyl Enzymatic Ointment Electronic Signature(s) Signed: 06/16/2019 4:52:55 PM By: Army Melia Signed: 06/16/2019 5:01:35 PM By: Linton Ham MD Entered By: Army Melia on 06/16/2019 13:28:49 [name] ([id number]) -------------------------------------------------------------------------------- Problem List Details Patient Name: [name] Date of Service: 06/16/2019 1:00 PM Medical Record Number: [id number] Patient Account Number: [id number] Date of Birth/Sex: 07/19/1965 (54 y.o. 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 M) Treating RN: Cornell Barman Primary Care Provider: Versie Starks, ADAM Other Clinician: Referring Provider: Versie Starks, ADAM Treating Provider/Extender: Tito Dine in Treatment: 1 Subjective History of Present Illness (HPI) ADMISSION 06/09/2019 This is a 54 year old man who was problem started last month. He developed cellulitis in his  left leg and was able to show me a picture on the phone indeed this looks like a prominent cellulitis around an open area where his wound is currently. He required admission from hot to hospital from 05/25/2019 through 05/27/2019 at which case he required an IandD with general anesthesia and IV antibiotics. He was discharged with Santyl doxycycline and trimethoprim sulfamethoxazole. Previous admission was on 05/19/2019 through 05/22/2019. A DVT rule out study on the leg was negative. He was discharged on Bactrim and Keflex but as mentioned required readmission within a week. Culture of the leg at that time showed staph epidermidis and group A strep. He has now at home putting Santyl on this wound which was prescribed by the hospital. Past  medical history includes alcohol abuse, COPD, tobacco abuse, HIV ABI in our clinic was 1.33 on the left 2/24; left anterior lower leg. Wound measures smaller. Rims of epithelialization still some debris on the surface he is using Santyl with a thick Band-Aid cover. He is back at work as of yesterday Objective Constitutional Vitals Time Taken: 1:00 PM, Height: 69 in, Weight: 165 lbs, BMI: 24.4, Temperature: 98.4 F, Pulse: 71 bpm, Respiratory Rate: 16 breaths/min, Blood Pressure: 123/71 mmHg. Integumentary (Hair, Skin) Wound #1 status is Open. Original cause of wound was Gradually Appeared. The wound is located on the Left,Anterior Lower Leg. The wound measures 2.2cm length x 1.4cm width x 0.1cm depth; 2.419cm^2 area and 0.242cm^3 volume. There is Fat Layer (Subcutaneous Tissue) Exposed exposed. There is no tunneling or undermining noted. There is a medium amount of serous drainage noted. The wound margin is flat and intact. There is medium (34-66%) pink granulation within the wound bed. There is a medium (34-66%) amount of necrotic tissue within the wound bed including Adherent Slough. Assessment Active Problems ICD-10 Cutaneous abscess of left lower  limb Cellulitis of left lower limb Non-pressure chronic ulcer of other part of left lower leg with fat layer exposed Fleissner, Marie ([id number]) Procedures Wound #1 Pre-procedure diagnosis of Wound #1 is an Abscess located on the Left,Anterior Lower Leg . There was a Excisional Skin/Subcutaneous Tissue Debridement with a total area of 3.08 sq cm performed by Ricard Dillon, MD. With the following instrument(s): Curette to remove Viable and Non-Viable tissue/material. Material removed includes Eschar, Subcutaneous Tissue, and Slough after achieving pain control using Lidocaine. A time out was conducted at 13:26, prior to the start of the procedure. A Minimum amount of bleeding was controlled with Pressure. The procedure was tolerated well. Post Debridement Measurements: 2.2cm length x 1.4cm width x 0.1cm depth; 0.242cm^3 volume. Character of Wound/Ulcer Post Debridement is stable. Post procedure Diagnosis Wound #1: Same as Pre-Procedure Plan Wound Cleansing: Wound #1 Left,Anterior Lower Leg: Cleanse wound with mild soap and water May Shower, gently pat wound dry prior to applying new dressing. Anesthetic (add to Medication List): Wound #1 Left,Anterior Lower Leg: Topical Lidocaine 4% cream applied to wound bed prior to debridement (In Clinic Only). Benzocaine Topical Anesthetic Spray applied to wound bed prior to debridement (In Clinic Only). Primary Wound Dressing: Wound #1 Left,Anterior Lower Leg: Santyl Ointment Secondary Dressing: Wound #1 Left,Anterior Lower Leg: Boardered Foam Dressing Dressing Change Frequency: Wound #1 Left,Anterior Lower Leg: Change Dressing Monday, Wednesday, Friday Follow-up Appointments: Wound #1 Left,Anterior Lower Leg: Return Appointment in 1 week. Edema Control: Wound #1 Left,Anterior Lower Leg: Elevate legs to the level of the heart and pump ankles as often as possible Medications-please add to medication list.: Wound #1 Left,Anterior Lower  Leg: Santyl Enzymatic Ointment 1. I continued with the Santyl although the surface is ready for something else if it stalls 2. Thick Band-Aid cover which she is changing daily. 3. Could change to Hydrofera Blue next week if the wound stalls Electronic Signature(s) Signed: 06/16/2019 5:01:35 PM By: Linton Ham MD Entered By: Linton Ham on 06/16/2019 13:40:17 [name] ([id number]) -------------------------------------------------------------------------------- SuperBill Details Patient Name: [name] Date of Service: 06/16/2019 Medical Record Number: [id number] Patient Account Number: [id number] Date of Birth/Sex: December 19, 1965 (54 y.o. M) Treating RN: Cornell Barman Primary Care Provider: Versie Starks, ADAM Other Clinician: Referring Provider: Versie Starks, ADAM Treating Provider/Extender: Tito Dine in Treatment: 1 Diagnosis Coding ICD-10 Codes Code Description L02.416 Cutaneous abscess of left lower limb L03.116 Cellulitis of  left lower limb L97.822 Non-pressure chronic ulcer of other part of left lower leg with fat layer exposed Facility Procedures CPT4 Code: [id number] Description: [id number] - DEB SUBQ TISSUE 20 SQ CM/< Modifier: Quantity: 1 CPT4 Code: Description: ICD-10 Diagnosis Description L97.822 Non-pressure chronic ulcer of other part of left lower leg with fat layer ex L02.416 Cutaneous abscess of left lower limb Modifier: posed Quantity: Physician Procedures CPT4 [id number] Description: [id number] - WC PHYS SUBQ TISS 20 SQ CM Modifier: Quantity: 1 CPT4 Code: Description: ICD-10 Diagnosis Description 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

## 2019-06-30 ENCOUNTER — Other Ambulatory Visit: Payer: Self-pay

## 2019-06-30 ENCOUNTER — Encounter: Payer: BC Managed Care – PPO | Admitting: Internal Medicine

## 2019-06-30 DIAGNOSIS — S81802A Unspecified open wound, left lower leg, initial encounter: Secondary | ICD-10-CM | POA: Diagnosis not present

## 2019-06-30 DIAGNOSIS — L97822 Non-pressure chronic ulcer of other part of left lower leg with fat layer exposed: Secondary | ICD-10-CM | POA: Diagnosis not present

## 2019-06-30 DIAGNOSIS — L03116 Cellulitis of left lower limb: Secondary | ICD-10-CM | POA: Diagnosis not present

## 2019-06-30 DIAGNOSIS — J449 Chronic obstructive pulmonary disease, unspecified: Secondary | ICD-10-CM | POA: Diagnosis not present

## 2019-06-30 DIAGNOSIS — L02416 Cutaneous abscess of left lower limb: Secondary | ICD-10-CM | POA: Diagnosis not present

## 2019-06-30 DIAGNOSIS — Z21 Asymptomatic human immunodeficiency virus [HIV] infection status: Secondary | ICD-10-CM | POA: Diagnosis not present

## 2019-06-30 DIAGNOSIS — I10 Essential (primary) hypertension: Secondary | ICD-10-CM | POA: Diagnosis not present

## 2019-06-30 NOTE — Progress Notes (Signed)
[name] ([name]) Visit Report for 06/30/2019 HPI Details Patient Name: [name], [name] Date of Service: 06/30/2019 10:45 AM Medical Record Number: [name] Patient Account Number: [id number] Date of Birth/Sex: [birth year] (54 y.o. M) Treating RN: [name] Primary Care Provider: Versie Starks, Morrison Other Clinician: Referring Provider: Versie Starks, Morrison Treating Provider/Extender: Tito Dine in Treatment: 3 History of Present Illness HPI Description: ADMISSION 06/09/2019 This is a 54 year old man who was problem started last month. He developed cellulitis in his left leg and was able to show me a picture on the phone indeed this looks like a prominent cellulitis around an open area where his wound is currently. He required admission from hot to hospital from 05/25/2019 through 05/27/2019 at which case he required an IandD with general anesthesia and IV antibiotics. He was discharged with Santyl doxycycline and trimethoprim sulfamethoxazole. Previous admission was on 05/19/2019 through 05/22/2019. A DVT rule out study on the leg was negative. He was discharged on Bactrim and Keflex but as mentioned required readmission within a week. Culture of the leg at that time showed staph epidermidis and group A strep. He has now at home putting Santyl on this wound which was prescribed by the hospital. Past medical history includes alcohol abuse, COPD, tobacco abuse, HIV ABI in our clinic was 1.33 on the left 2/24; left anterior lower leg. Wound measures smaller. Rims of epithelialization still some debris on the surface he is using Santyl with a thick Band-Aid cover. He is back at work as of yesterday 3/3; left anterior lower leg wound. Wound wound measures smaller. Rims of epithelialization. Only a tiny open area that is not fully epithelialized remains. We have been using Santyl I do not think there is any good reason to continue this we will put Hydrofera Blue on this to try and close the  soft today 3/10; left anterior lower leg wound. Initially an area of active cellulitis requiring hospitalization. He is totally closed over now. Electronic Signature(s) Signed: 06/30/2019 5:25:14 PM By: Linton Ham MD Entered By: Linton Ham on 06/30/2019 11:22:07 [name] ([name]) -------------------------------------------------------------------------------- Physical Exam Details Patient Name: [name] Date of Service: 06/30/2019 10:45 AM Medical Record Number: [name] Patient Account Number: [id number] Date of Birth/Sex: November 09, 1965 (54 y.o. M) Treating RN: [name] Primary Care Provider: Versie Starks, Morrison Other Clinician: Referring Provider: Versie Starks, Morrison Treating Provider/Extender: Tito Dine in Treatment: 3 Notes Wound exam; the area in question is on the left anterior mid tibia. He has some slight surface eschar on this I removed some of it there is no open wound here. No surrounding infection Electronic Signature(s) Signed: 06/30/2019 5:25:14 PM By: Linton Ham MD Entered By: Linton Ham on 06/30/2019 11:22:38 [name] ([name]) -------------------------------------------------------------------------------- Physician Orders Details Patient Name: [name] Date of Service: 06/30/2019 10:45 AM Medical Record Number: [name] Patient Account Number: [id number] Date of Birth/Sex: Jan 18, 1966 (54 y.o. M) Treating RN: [name] Primary Care Provider: Versie Starks, Morrison Other Clinician: Referring Provider: Versie Starks, Morrison Treating Provider/Extender: Tito Dine in Treatment: 3 Verbal / Phone Orders: No Diagnosis Coding Discharge From [facility] Services o Discharge from [place] - treatment complete Electronic Signature(s) Signed: 06/30/2019 5:22:30 PM By: Gretta Cool, BSN, RN, CWS, Kim RN, BSN Signed: 06/30/2019 5:25:14 PM By: Linton Ham MD Entered By: Gretta Cool, BSN, RN, CWS, Kim on 06/30/2019 11:10:32 [name]  ([name]) -------------------------------------------------------------------------------- Problem List Details Patient Name: [name] Date of Service: 06/30/2019 10:45 AM Medical Record Number: [name] Patient Account Number: [id number] Date of Birth/Sex: 11-24-1965 (53  y.o. M) Treating RN: [name] Primary Care Provider: Versie Starks, Morrison Other Clinician: Referring Provider: Versie Starks, Morrison Treating Provider/Extender: Tito Dine in Treatment: 3 Active Problems ICD-10 Evaluated Encounter Code Description Active Date Today Diagnosis L02.416 Cutaneous abscess of left lower limb 06/09/2019 No Yes L03.116 Cellulitis of left lower limb 06/09/2019 No Yes L97.822 Non-pressure chronic ulcer of other part of left lower leg with fat layer 06/09/2019 No Yes exposed Inactive Problems Resolved Problems Electronic Signature(s) Signed: 06/30/2019 5:25:14 PM By: Linton Ham MD Entered By: Linton Ham on 06/30/2019 11:17:45 [name] ([name]) -------------------------------------------------------------------------------- Progress Note Details Patient Name: [name] Date of Service: 06/30/2019 10:45 AM Medical Record Number: [name] Patient Account Number: [id number] Date of Birth/Sex: 16-Aug-1965 (53 y.o. M) Treating RN: [name] Primary Care Provider: Versie Starks, Morrison Other Clinician: Referring Provider: Versie Starks, Morrison Treating Provider/Extender: Tito Dine in Treatment: 3 Subjective History of Present Illness (HPI) ADMISSION 06/09/2019 This is a 54 year old man who was problem started last month. He developed cellulitis in his left leg and was able to show me a picture on the phone indeed this looks like a prominent cellulitis around an open area where his wound is currently. He required admission from hot to hospital from 05/25/2019 through 05/27/2019 at which case he required an IandD with general anesthesia and IV antibiotics. He was discharged with  Santyl doxycycline and trimethoprim sulfamethoxazole. Previous admission was on 05/19/2019 through 05/22/2019. A DVT rule out study on the leg was negative. He was discharged on Bactrim and Keflex but as mentioned required readmission within a week. Culture of the leg at that time showed staph epidermidis and group A strep. He has now at home putting Santyl on this wound which was prescribed by the hospital. Past medical history includes alcohol abuse, COPD, tobacco abuse, HIV ABI in our clinic was 1.33 on the left 2/24; left anterior lower leg. Wound measures smaller. Rims of epithelialization still some debris on the surface he is using Santyl with a thick Band-Aid cover. He is back at work as of yesterday 3/3; left anterior lower leg wound. Wound wound measures smaller. Rims of epithelialization. Only a tiny open area that is not fully epithelialized remains. We have been using Santyl I do not think there is any good reason to continue this we will put Hydrofera Blue on this to try and close the soft today 3/10; left anterior lower leg wound. Initially an area of active cellulitis requiring hospitalization. He is totally closed over now. Objective Constitutional Vitals Time Taken: 10:50 AM, Height: 69 in, Weight: 165 lbs, BMI: 24.4, Temperature: 98.7 F, Pulse: 75 bpm, Respiratory Rate: 16 breaths/min, Blood Pressure: 134/71 mmHg. Integumentary (Hair, Skin) Wound #1 status is Healed - Epithelialized. Original cause of wound was Gradually Appeared. The wound is located on the Left,Anterior Lower Leg. The wound measures 0cm length x 0cm width x 0cm depth; 0cm^2 area and 0cm^3 volume. There is Fat Layer (Subcutaneous Tissue) Exposed exposed. There is a medium amount of serous drainage noted. The wound margin is flat and intact. There is no granulation within the wound bed. There is a large (67-100%) amount of necrotic tissue within the wound bed including Eschar. Assessment Active  Problems ICD-10 Cutaneous abscess of left lower limb Cellulitis of left lower limb Non-pressure chronic ulcer of other part of left lower leg with fat layer exposed THANOS, CAPAN ([name]) Plan Discharge From [facility] Services: Discharge from [place] - treatment complete 1. The patient can be discharged from the  wound care center 2. Predominantly a wound secondary to severe left lower extremity cellulitis 3. I have asked him to keep this area covered in a nonadherent pad for perhaps the next 3 or 4 weeks. Electronic Signature(s) Signed: 06/30/2019 5:25:14 PM By: Linton Ham MD Entered By: Linton Ham on 06/30/2019 11:23:16 [name] ([id number]) -------------------------------------------------------------------------------- [place] Details Patient Name: [name] Date of Service: 06/30/2019 Medical Record Number: [id number] Patient Account Number: [id number] Date of Birth/Sex: Jun 23, 1965 (54 y.o. M) Treating RN: [name] Primary Care Provider: Versie Starks, Morrison Other Clinician: Referring Provider: Versie Starks, Morrison Treating Provider/Extender: Tito Dine in Treatment: 3 Diagnosis Coding ICD-10 Codes Code Description L02.416 Cutaneous abscess of left lower limb L03.116 Cellulitis of left lower limb L97.822 Non-pressure chronic ulcer of other part of left lower leg with fat layer exposed Facility Procedures CPT4 Code: [id number] Description: [id number] - WOUND CARE VISIT-LEV 2 EST PT Modifier: Quantity: 1 Physician Procedures CPT4 Code: [id number] Description: [id number] - WC PHYS LEVEL 2 - EST PT Modifier: Quantity: 1 CPT4 Code: Description: ICD-10 Diagnosis Description L02.416 Cutaneous abscess of left lower limb L03.116 Cellulitis of left lower limb L97.822 Non-pressure chronic ulcer of other part of left lower leg with fat layer e Modifier: xposed Quantity: Electronic Signature(s) Signed: 06/30/2019 5:25:14 PM By: Linton Ham MD Entered By: Linton Ham  on 06/30/2019 11:23:43

## 2019-06-30 NOTE — Progress Notes (Signed)
[name], [name] ([id number]) Visit Report for 06/30/2019 Arrival Information Details Patient Name: [name], [name] Date of Service: 06/30/2019 10:45 AM Medical Record Number: [id number] Patient Account Number: [id number] Date of Birth/Sex: 1965/10/16 (54 y.o. M) Treating RN: Cornell Barman Primary Care Sekou Zuckerman: Versie Starks, ADAM Other Clinician: Referring Eduardo Honor: Versie Starks, ADAM Treating Lue [place]/Extender: Tito Dine in Treatment: 3 Visit Information History Since Last Visit Added or deleted any medications: No Patient Arrived: Ambulatory Any new allergies or adverse reactions: No Arrival Time: 10:54 Had a fall or experienced change in No Accompanied By: self activities of daily living that may affect Transfer Assistance: None risk of falls: Patient Identification Verified: Yes Signs or symptoms of abuse/neglect since last visito No Secondary Verification Process Completed: Yes Hospitalized since last visit: No Patient Requires Transmission-Based Precautions: Yes Implantable device outside of the clinic excluding No 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 M) Treating RN: Cornell Barman Primary Care Siegfried Vieth: Versie Starks, ADAM Other Clinician: Referring [name]: Versie Starks, ADAM Treating Laree Garron/Extender: Tito Dine in Treatment: 3 Clinic Level of Care Assessment Items TOOL 4 Quantity  Score []  - Use when only an EandM is performed on FOLLOW-UP visit 0 ASSESSMENTS - Nursing Assessment / Reassessment []  - Reassessment of Co-morbidities (includes updates in patient status) 0 X- 1 5 Reassessment of Adherence to Treatment Plan ASSESSMENTS - Wound and Skin Assessment / Reassessment X - Simple Wound Assessment / Reassessment - one wound 1 5 []  - 0 Complex Wound Assessment / Reassessment - multiple wounds []  - 0 Dermatologic / Skin Assessment (not related to wound area) ASSESSMENTS - Focused Assessment []  - Circumferential Edema Measurements - multi extremities 0 []  - 0 Nutritional Assessment / Counseling / Intervention []  - 0 Lower Extremity Assessment (monofilament, tuning fork, pulses) []  - 0 Peripheral Arterial Disease Assessment (using hand held doppler) ASSESSMENTS - Ostomy and/or Continence Assessment and Care []  - Incontinence Assessment and Management 0 []  - 0 Ostomy Care Assessment and Management (repouching, etc.) PROCESS - Coordination of Care X - Simple Patient / Family Education for ongoing care 1 15 []  - 0 Complex (extensive) Patient / Family Education for ongoing care []  - 0 Staff obtains Programmer, systems, Records, Test Results / Process Orders []  - 0 Staff telephones HHA, Nursing Homes / Clarify orders / etc []  - 0 Routine Transfer to another Facility (non-emergent condition) []  - 0 Routine Hospital Admission (non-emergent condition) []  - 0 New Admissions / Biomedical engineer / Ordering NPWT, Apligraf, etc. []  - 0 Emergency Hospital Admission (emergent condition) []  - 0 Simple Discharge Coordination []  - 0 Complex (extensive) Discharge Coordination PROCESS - Special Needs []  - Pediatric / Minor Patient Management 0 []  - 0 Isolation Patient Management []  - 0 Hearing / Language / Visual special needs []  - 0 Assessment of Community assistance (transportation, D/C planning, etc.) [name], [name] ([id number]) []  - 0 Additional assistance /  Altered mentation []  - 0 Support Surface(s) Assessment (bed, cushion, seat, etc.) INTERVENTIONS - Wound Cleansing / Measurement X - Simple Wound Cleansing - one wound 1 5 []  - 0 Complex Wound Cleansing - multiple wounds  X- 1 5 Wound Imaging (photographs - any number of wounds) []  - 0 Wound Tracing (instead of photographs) X- 1 5 Simple Wound Measurement - one wound []  - 0 Complex Wound Measurement - multiple wounds INTERVENTIONS - Wound Dressings []  - Small Wound Dressing one or multiple wounds 0 []  - 0 Medium Wound Dressing one or multiple wounds []  - 0 Large Wound Dressing one or multiple wounds []  - 0 Application of Medications - topical []  - 0 Application of Medications - injection INTERVENTIONS - Miscellaneous []  - External ear exam 0 []  - 0 Specimen Collection (cultures, biopsies, blood, body fluids, etc.) []  - 0 Specimen(s) / Culture(s) sent or taken to Lab for analysis []  - 0 Patient Transfer (multiple staff / Civil Service fast streamer / Similar devices) []  - 0 Simple Staple / Suture removal (25 or less) []  - 0 Complex Staple / Suture removal (26 or more) []  - 0 Hypo / Hyperglycemic Management (close monitor of Blood Glucose) []  - 0 Ankle / Brachial Index (ABI) - do not check if billed separately X- 1 5 Vital Signs Has the patient been seen at the hospital within the last three years: Yes Total Score: 45 Level Of Care: New/Established - Level 2 Electronic Signature(s) Signed: 06/30/2019 5:22:30 PM By: Gretta Cool, BSN, RN, CWS, Kim RN, BSN Entered By: Gretta Cool, BSN, RN, CWS, Kim on 06/30/2019 11:11:15 [name] ([id number]) -------------------------------------------------------------------------------- Encounter Discharge Information Details Patient Name: [name] Date of Service: 06/30/2019 10:45 AM Medical Record Number: [id number] Patient Account Number: [id number] Date of Birth/Sex: October 01, 1965 (54 y.o. M) Treating RN: Cornell Barman Primary Care Vikram Tillett: Versie Starks, ADAM Other  Clinician: Referring Angelli Baruch: Versie Starks, ADAM Treating Odies Desa/Extender: Tito Dine in Treatment: 3 Encounter Discharge Information Items Discharge Condition: Stable Ambulatory Status: Ambulatory Discharge Destination: Home Telephoned: Yes Orders Sent: Yes Transportation: Private Auto Accompanied By: self Schedule Follow-up Appointment: Yes Clinical Summary of Care: Electronic Signature(s) Signed: 06/30/2019 5:22:30 PM By: Gretta Cool, BSN, RN, CWS, Kim RN, BSN Entered By: Gretta Cool, BSN, RN, CWS, Kim on 06/30/2019 11:11:36 [name] ([id number]) -------------------------------------------------------------------------------- Lower Extremity Assessment Details Patient Name: [name] Date of Service: 06/30/2019 10:45 AM Medical Record Number: [id number] Patient Account Number: [id number] Date of Birth/Sex: November 30, 1965 (54 y.o. M) Treating RN: Army Melia Primary Care Layani Foronda: Versie Starks, ADAM Other Clinician: Referring Ramar Nobrega: Versie Starks, ADAM Treating Abigayl Hor/Extender: Ricard Dillon Weeks in Treatment: 3 Edema Assessment Assessed: [Left: No] [Right: No] Edema: [Left: N] [Right: o] Vascular Assessment Pulses: Dorsalis Pedis Palpable: [Left:Yes] Electronic Signature(s) Signed: 06/30/2019 11:25:05 AM By: Army Melia Entered By: Army Melia on 06/30/2019 10:57:46 [name] ([id number]) -------------------------------------------------------------------------------- Multi Wound Chart Details Patient Name: [name] Date of Service: 06/30/2019 10:45 AM Medical Record Number: [id number] Patient Account Number: [id number] Date of Birth/Sex: May 31, 1965 (54 y.o. M) Treating RN: Cornell Barman Primary Care Karthika Glasper: Versie Starks, ADAM Other Clinician: Referring Monterrius Cardosa: Versie Starks, ADAM Treating Precious Gilchrest/Extender: Tito Dine in Treatment: 3 Vital Signs Height(in): 69 Pulse(bpm): 75 Weight(lbs): 165 Blood Pressure(mmHg): 134/71 Body Mass Index(BMI):  24 Temperature(F): 98.7 Respiratory Rate(breaths/min): 16 Photos: [N/A:N/A] Wound Location: Left, Anterior Lower Leg N/A N/A Wounding Event: Gradually Appeared N/A N/A Primary Etiology: Abscess N/A N/A Comorbid History: Hypertension N/A N/A Date Acquired: 05/27/2019 N/A N/A Weeks of Treatment: 3 N/A N/A Wound Status: Healed - Epithelialized N/A N/A Measurements L x W x D (cm) 0x0x0 N/A N/A Area (cm) : 0 N/A N/A Volume (cm) : 0 N/A N/A % Reduction in Area: 100.00% N/A N/A % Reduction in Volume: 100.00% N/A N/A Classification:  Full Thickness Without Exposed N/A N/A Support Structures Exudate Amount: Medium N/A N/A Exudate Type: Serous N/A N/A Exudate Color: amber N/A N/A Wound Margin: Flat and Intact N/A N/A Granulation Amount: None Present (0%) N/A N/A Necrotic Amount: Large (67-100%) N/A N/A Necrotic Tissue: Eschar N/A N/A Exposed Structures: Fat Layer (Subcutaneous Tissue) N/A N/A Exposed: Yes Fascia: No Tendon: No Muscle: No Joint: No Bone: No Epithelialization: Medium (34-66%) N/A N/A Treatment Notes Electronic Signature(s) Signed: 06/30/2019 5:25:14 PM By: Linton Ham MD Entered By: Linton Ham on 06/30/2019 11:18:53 [name] ([id number]) [name], [name] ([id number]) -------------------------------------------------------------------------------- Multi-Disciplinary Care Plan Details Patient Name: [name] Date of Service: 06/30/2019 10:45 AM Medical Record Number: [id number] Patient Account Number: [id number] Date of Birth/Sex: 1966/02/06 (54 y.o. M) Treating RN: Cornell Barman Primary Care Aime Meloche: Versie Starks, ADAM Other Clinician: Referring Kerry Odonohue: Versie Starks, ADAM Treating Arlanda Shiplett/Extender: Tito Dine in Treatment: 3 Active Inactive Electronic Signature(s) Signed: 06/30/2019 11:16:30 AM By: Gretta Cool, BSN, RN, CWS, Kim RN, BSN Entered By: Gretta Cool, BSN, RN, CWS, Kim on 06/30/2019 11:16:30 [name]  ([id number]) -------------------------------------------------------------------------------- Pain Assessment Details Patient Name: [name] Date of Service: 06/30/2019 10:45 AM Medical Record Number: [id number] Patient Account Number: [id number] Date of Birth/Sex: [birth year] (54 y.o. M) Treating RN: Army Melia Primary Care Catrina Fellenz: Versie Starks, ADAM Other Clinician: Referring Nazirah Tri: Versie Starks, ADAM Treating Valissa Lyvers/Extender: Tito Dine in Treatment: 3 Active Problems Location of Pain Severity and Description of Pain Patient Has Paino No Site Locations With Dressing Change: Yes Character of Pain Pain Management and Medication Current Pain Management: Electronic Signature(s) Signed: 06/30/2019 11:25:05 AM By: Army Melia Entered By: Army Melia on 06/30/2019 10:56:42 [name] ([id number]) -------------------------------------------------------------------------------- Wound Assessment Details Patient Name: [name] Date of Service: 06/30/2019 10:45 AM Medical Record Number: [id number] Patient Account Number: [id number] Date of Birth/Sex: 28-May-1965 (54 y.o. M) Treating RN: Cornell Barman Primary Care D'Arcy Abraha: Versie Starks, ADAM Other Clinician: Referring Aquita Simmering: Versie Starks, ADAM Treating Kynli Chou/Extender: Tito Dine in Treatment: 3 Wound Status Wound Number: 1 Primary Etiology: Abscess Wound Location: Left, Anterior Lower Leg Wound Status: Healed - Epithelialized Wounding Event: Gradually Appeared Comorbid History: Hypertension Date Acquired: 05/27/2019 Weeks Of Treatment: 3 Clustered Wound: No Photos Wound Measurements Length: (cm) Width: (cm) Depth: (cm) Area: (cm) Volume: (cm) 0 % Reduction in Area: 100% 0 % Reduction in Volume: 100% 0 Epithelialization: Medium (34-66%) 0 0 Wound Description Classification: Full Thickness Without Exposed Support Structu Wound Margin: Flat and Intact Exudate Amount: Medium Exudate Type:  Serous Exudate Color: amber res Foul Odor After Cleansing: No Slough/Fibrino Yes Wound Bed Granulation Amount: None Present (0%) Exposed Structure Necrotic Amount: Large (67-100%) Fascia Exposed: No Necrotic Quality: Eschar Fat Layer (Subcutaneous Tissue) Exposed: Yes Tendon Exposed: No Muscle Exposed: No Joint Exposed: No Bone Exposed: No Electronic Signature(s) Signed: 06/30/2019 5:22:30 PM By: Gretta Cool, BSN, RN, CWS, Kim RN, BSN Entered By: Gretta Cool, BSN, RN, CWS, Kim on 06/30/2019 11:07:14 [name] ([id number]) -------------------------------------------------------------------------------- Vitals Details Patient Name: [name] Date of Service: 06/30/2019 10:45 AM Medical Record Number: [id number] Patient Account Number: [id number] Date of Birth/Sex: February 18, 1966 (54 y.o. M) Treating RN: Cornell Barman Primary Care Adrianna Dudas: Versie Starks, ADAM Other Clinician: Referring Nathyn Luiz: Versie Starks, ADAM Treating Tailyn Hantz/Extender: Tito Dine in Treatment: 3 Vital Signs Time Taken: 10:50 Temperature (F): 98.7 Height (in): 69 Pulse (bpm): 75 Weight (lbs): 165 Respiratory Rate (breaths/min): 16 Body Mass Index (BMI): 24.4 Blood Pressure (mmHg): 134/71 Reference Range: 80 - 120 mg / dl Electronic Signature(s) Signed: 06/30/2019 4:48:03 PM By: Lorine Bears  RCP, RRT, CHT Entered By: Lorine Bears on 06/30/2019 10:55:53

## 2019-07-01 ENCOUNTER — Ambulatory Visit: Payer: BC Managed Care – PPO | Admitting: Adult Health

## 2019-07-02 ENCOUNTER — Telehealth: Payer: Self-pay

## 2019-07-02 NOTE — Telephone Encounter (Signed)
Called [facility] informing patient of appointment on 07/06/2019. klh 

## 2019-07-06 ENCOUNTER — Ambulatory Visit: Payer: BC Managed Care – PPO | Admitting: Adult Health

## 2019-07-07 ENCOUNTER — Encounter: Payer: BC Managed Care – PPO | Admitting: Internal Medicine

## 2019-07-19 ENCOUNTER — Other Ambulatory Visit: Payer: Self-pay | Admitting: Adult Health

## 2019-07-19 DIAGNOSIS — F419 Anxiety disorder, unspecified: Secondary | ICD-10-CM

## 2019-07-29 DIAGNOSIS — F411 Generalized anxiety disorder: Secondary | ICD-10-CM | POA: Diagnosis not present

## 2019-07-29 DIAGNOSIS — Z125 Encounter for screening for malignant neoplasm of prostate: Secondary | ICD-10-CM | POA: Diagnosis not present

## 2019-07-29 DIAGNOSIS — E782 Mixed hyperlipidemia: Secondary | ICD-10-CM | POA: Diagnosis not present

## 2019-07-30 LAB — CBC WITH DIFFERENTIAL/PLATELET
Basophils Absolute: 0.1 10*3/uL (ref 0.0–0.2)
Basos: 1 %
EOS (ABSOLUTE): 0.2 10*3/uL (ref 0.0–0.4)
Eos: 3 %
Hematocrit: 44.4 % (ref 37.5–51.0)
Hemoglobin: 15.3 g/dL (ref 13.0–17.7)
Immature Grans (Abs): 0 10*3/uL (ref 0.0–0.1)
Immature Granulocytes: 0 %
Lymphocytes Absolute: 1.9 10*3/uL (ref 0.7–3.1)
Lymphs: 40 %
MCH: 33.7 pg — ABNORMAL HIGH (ref 26.6–33.0)
MCHC: 34.5 g/dL (ref 31.5–35.7)
MCV: 98 fL — ABNORMAL HIGH (ref 79–97)
Monocytes Absolute: 0.5 10*3/uL (ref 0.1–0.9)
Monocytes: 9 %
Neutrophils Absolute: 2.3 10*3/uL (ref 1.4–7.0)
Neutrophils: 47 %
Platelets: 190 10*3/uL (ref 150–450)
RBC: 4.54 x10E6/uL (ref 4.14–5.80)
RDW: 13.1 % (ref 11.6–15.4)
WBC: 4.8 10*3/uL (ref 3.4–10.8)

## 2019-07-30 LAB — COMPREHENSIVE METABOLIC PANEL
ALT: 14 IU/L (ref 0–44)
AST: 27 IU/L (ref 0–40)
Albumin/Globulin Ratio: 2 (ref 1.2–2.2)
Albumin: 4.4 g/dL (ref 3.8–4.9)
Alkaline Phosphatase: 71 IU/L (ref 39–117)
BUN/Creatinine Ratio: 8 — ABNORMAL LOW (ref 9–20)
BUN: 9 mg/dL (ref 6–24)
Bilirubin Total: 0.5 mg/dL (ref 0.0–1.2)
CO2: 24 mmol/L (ref 20–29)
Calcium: 9.1 mg/dL (ref 8.7–10.2)
Chloride: 103 mmol/L (ref 96–106)
Creatinine, Ser: 1.07 mg/dL (ref 0.76–1.27)
GFR calc Af Amer: 92 mL/min/{1.73_m2} (ref >59)
GFR calc non Af Amer: 80 mL/min/{1.73_m2} (ref >59)
Globulin, Total: 2.2 g/dL (ref 1.5–4.5)
Glucose: 102 mg/dL — ABNORMAL HIGH (ref 65–99)
Potassium: 4.3 mmol/L (ref 3.5–5.2)
Sodium: 141 mmol/L (ref 134–144)
Total Protein: 6.6 g/dL (ref 6.0–8.5)

## 2019-07-30 LAB — LIPID PANEL WITH LDL/HDL RATIO
Cholesterol, Total: 161 mg/dL (ref 100–199)
HDL: 52 mg/dL (ref >39)
LDL Chol Calc (NIH): 47 mg/dL (ref 0–99)
LDL/HDL Ratio: 0.9 ratio (ref 0.0–3.6)
Triglycerides: 426 mg/dL — ABNORMAL HIGH (ref 0–149)
VLDL Cholesterol Cal: 62 mg/dL — ABNORMAL HIGH (ref 5–40)

## 2019-07-30 LAB — TSH: TSH: 0.931 u[IU]/mL (ref 0.450–4.500)

## 2019-07-30 LAB — T4, FREE: Free T4: 0.99 ng/dL (ref 0.82–1.77)

## 2019-07-30 LAB — PSA: Prostate Specific Ag, Serum: 0.7 ng/mL (ref 0.0–4.0)

## 2019-08-09 ENCOUNTER — Other Ambulatory Visit: Payer: Self-pay | Admitting: Internal Medicine

## 2019-08-10 ENCOUNTER — Telehealth: Payer: Self-pay

## 2019-08-10 NOTE — Telephone Encounter (Signed)
Confirmed appointment on 04/22/201 and screened for covid. klh

## 2019-08-12 ENCOUNTER — Other Ambulatory Visit: Payer: Self-pay

## 2019-08-12 ENCOUNTER — Ambulatory Visit (INDEPENDENT_AMBULATORY_CARE_PROVIDER_SITE_OTHER): Payer: BC Managed Care – PPO | Admitting: Adult Health

## 2019-08-12 ENCOUNTER — Encounter: Payer: Self-pay | Admitting: Adult Health

## 2019-08-12 VITALS — BP 160/88 | HR 77 | Temp 97.2°F | Resp 16 | Ht 69.0 in | Wt 174.0 lb

## 2019-08-12 DIAGNOSIS — R300 Dysuria: Secondary | ICD-10-CM | POA: Diagnosis not present

## 2019-08-12 DIAGNOSIS — F1010 Alcohol abuse, uncomplicated: Secondary | ICD-10-CM | POA: Diagnosis not present

## 2019-08-12 DIAGNOSIS — I6523 Occlusion and stenosis of bilateral carotid arteries: Secondary | ICD-10-CM

## 2019-08-12 DIAGNOSIS — Z0001 Encounter for general adult medical examination with abnormal findings: Secondary | ICD-10-CM

## 2019-08-12 DIAGNOSIS — B20 Human immunodeficiency virus [HIV] disease: Secondary | ICD-10-CM | POA: Diagnosis not present

## 2019-08-12 DIAGNOSIS — R739 Hyperglycemia, unspecified: Secondary | ICD-10-CM

## 2019-08-12 DIAGNOSIS — I10 Essential (primary) hypertension: Secondary | ICD-10-CM | POA: Diagnosis not present

## 2019-08-12 DIAGNOSIS — F411 Generalized anxiety disorder: Secondary | ICD-10-CM

## 2019-08-12 DIAGNOSIS — F17209 Nicotine dependence, unspecified, with unspecified nicotine-induced disorders: Secondary | ICD-10-CM

## 2019-08-12 DIAGNOSIS — J449 Chronic obstructive pulmonary disease, unspecified: Secondary | ICD-10-CM

## 2019-08-12 LAB — POCT GLYCOSYLATED HEMOGLOBIN (HGB A1C): Hemoglobin A1C: 4.9 % (ref 4.0–5.6)

## 2019-08-12 NOTE — Addendum Note (Signed)
Addended by: Kendell Bane on: 08/12/2019 05:48 PM   Modules accepted: Level of Service

## 2019-08-12 NOTE — Progress Notes (Signed)
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 Phosphatase 71 39 - 117 IU/L   AST 27 0 - 40 IU/L   ALT 14 0 - 44 IU/L  PSA     Status: None   Collection  Time: 07/29/19  2:18 PM  Result Value Ref Range   Prostate Specific Ag, Serum 0.7 0.0 - 4.0 ng/mL    Comment: Roche ECLIA methodology. According to the American Urological Association, Serum PSA should decrease and remain at undetectable levels after radical prostatectomy. The AUA defines biochemical recurrence as an initial PSA value 0.2 ng/mL or greater followed by a subsequent confirmatory PSA value 0.2 ng/mL or greater. Values obtained with different assay methods or kits cannot be used interchangeably. Results cannot be interpreted as absolute evidence of the presence or absence of malignant disease.   POCT HgB A1C     Status: None   Collection Time: 08/12/19 10:21 AM  Result Value Ref Range   Hemoglobin A1C 4.9 4.0 - 5.6 %   HbA1c POC (<> result, manual entry)     HbA1c, POC (prediabetic range)     HbA1c, POC (controlled diabetic range)      Assessment/Plan: 1. Encounter for general adult medical examination with abnormal findings Up to date on PHM.   2. Generalized anxiety disorder  Stable, continue present management.   3. HIV infection, unspecified symptom status ([place]) Sees infectious disease next month.  Continue their management.   4. Hypertension, unspecified type Elevated today,   5. Alcohol abuse Continue to drink on weekends, 3-5 mixed drinks.  6. Nicotine dependence with nicotine-induced disorder, unspecified nicotine product type Smoking cessation counseling: 1. Pt acknowledges the risks of long term smoking, she will try to quite smoking. 2. Options for different medications including nicotine products, chewing gum, patch etc, Wellbutrin and Chantix is discussed 3. Goal and date of compete cessation is discussed 4. Total time spent in smoking cessation is 15 min.  7. Chronic obstructive pulmonary disease, unspecified COPD type ([place])  8. Dysuria - Urinalysis, Routine w reflex microscopic  General Counseling: Nghia verbalizes understanding of the  findings of todays visit and agrees with plan of treatment. I have discussed any further diagnostic evaluation that may be needed or ordered today. We also reviewed his medications today. he has been encouraged to call the office with any questions or concerns that should arise related to todays visit.   Orders Placed This Encounter  Procedures  . US Carotid Bilateral  . Urinalysis, Routine w reflex microscopic  . POCT HgB A1C    No orders of the defined types were placed in this encounter.   Time spent: 30 Minutes   This patient was seen by Orson Gear AGNP-C in Collaboration with Dr Lavera Guise as a part of collaborative care agreement    Kendell Bane AGNP-C Internal Medicine

## 2019-08-13 LAB — URINALYSIS, ROUTINE W REFLEX MICROSCOPIC
Bilirubin, UA: NEGATIVE
Glucose, UA: NEGATIVE
Nitrite, UA: NEGATIVE
RBC, UA: NEGATIVE
Specific Gravity, UA: 1.027 (ref 1.005–1.030)
Urobilinogen, Ur: 0.2 mg/dL (ref 0.2–1.0)
pH, UA: 6 (ref 5.0–7.5)

## 2019-08-13 LAB — MICROSCOPIC EXAMINATION
Bacteria, UA: NONE SEEN
Casts: NONE SEEN /lpf
Epithelial Cells (non renal): NONE SEEN /hpf (ref 0–10)

## 2019-08-28 ENCOUNTER — Other Ambulatory Visit: Payer: Self-pay | Admitting: Internal Medicine

## 2019-09-01 ENCOUNTER — Telehealth: Payer: Self-pay

## 2019-09-01 ENCOUNTER — Other Ambulatory Visit: Payer: Self-pay

## 2019-09-01 MED ORDER — HYDROXYZINE HCL 25 MG PO TABS: 25.0000 mg | ORAL_TABLET | Freq: Two times a day (BID) | ORAL | 3 refills | Status: DC

## 2019-09-01 NOTE — Telephone Encounter (Signed)
Confirmed appointment on 09/03/2019 and screened for covid. klh

## 2019-09-02 ENCOUNTER — Other Ambulatory Visit: Payer: Self-pay

## 2019-09-02 MED ORDER — HYDROXYZINE HCL 25 MG PO TABS: 25.0000 mg | ORAL_TABLET | Freq: Two times a day (BID) | ORAL | 0 refills | Status: DC

## 2019-09-03 ENCOUNTER — Ambulatory Visit: Payer: BC Managed Care – PPO

## 2019-09-03 DIAGNOSIS — I6523 Occlusion and stenosis of bilateral carotid arteries: Secondary | ICD-10-CM

## 2019-09-06 ENCOUNTER — Telehealth: Payer: Self-pay

## 2019-09-06 NOTE — Telephone Encounter (Signed)
Confirmed and screened for 09-08-19 ov.

## 2019-09-06 NOTE — Telephone Encounter (Signed)
Called lmom informing patient of appointment on 09/08/2019. klh

## 2019-09-08 ENCOUNTER — Other Ambulatory Visit: Payer: Self-pay

## 2019-09-08 ENCOUNTER — Ambulatory Visit (INDEPENDENT_AMBULATORY_CARE_PROVIDER_SITE_OTHER): Payer: BC Managed Care – PPO | Admitting: Adult Health

## 2019-09-08 ENCOUNTER — Encounter: Payer: Self-pay | Admitting: Adult Health

## 2019-09-08 VITALS — BP 123/86 | HR 90 | Temp 97.9°F | Resp 16 | Ht 69.0 in | Wt 166.8 lb

## 2019-09-08 DIAGNOSIS — M79605 Pain in left leg: Secondary | ICD-10-CM

## 2019-09-08 DIAGNOSIS — F411 Generalized anxiety disorder: Secondary | ICD-10-CM

## 2019-09-08 DIAGNOSIS — B20 Human immunodeficiency virus [HIV] disease: Secondary | ICD-10-CM

## 2019-09-08 DIAGNOSIS — F17200 Nicotine dependence, unspecified, uncomplicated: Secondary | ICD-10-CM

## 2019-09-08 DIAGNOSIS — I10 Essential (primary) hypertension: Secondary | ICD-10-CM

## 2019-09-08 NOTE — Progress Notes (Signed)
[facility] [place], [place] [address]  Internal MEDICINE  Office Visit Note  Patient Name: [name]  [id number]  [id number]  Date of Service: 09/20/2019  Chief Complaint  Patient presents with  . Follow-up    review ultrasound, random tingling on left arm and left leg  . Hypertension    HPI  Pt is here for follow up on Carotid Ultrasound.  His [place] is negative for stenosis.  He does complain today of Htn. His anxiety remains elevated, but he feels it is manageable at this time.     Current Medication: Outpatient Encounter Medications as of 09/08/2019  Medication Sig  . albuterol (VENTOLIN HFA) 108 (90 Base) MCG/ACT inhaler Inhale 1-2 puffs into the lungs every 6 (six) hours as needed for wheezing or shortness of breath.  . bictegravir-emtricitabine-tenofovir AF (BIKTARVY) 50-200-25 MG TABS tablet Take 1 tablet by mouth daily.   . cetirizine (ZYRTEC) 10 MG tablet Take 10 mg by mouth daily.  . cyclobenzaprine (FLEXERIL) 10 MG tablet TAKE 1 TABLET BY MOUTH TWICE A DAY AS NEEDED FOR MUSCLE SPASMS  . escitalopram (LEXAPRO) 20 MG tablet TAKE 1 TABLET BY MOUTH DAILY WITH SUPPER  . fluticasone (FLONASE) 50 MCG/ACT nasal spray Place 1 spray into both nostrils daily.  . hydrOXYzine (ATARAX/VISTARIL) 25 MG tablet Take 1 tablet (25 mg total) by mouth 2 (two) times daily.  Marland Kitchen lisinopril (ZESTRIL) 10 MG tablet Take 1 tablet (10 mg total) by mouth daily.  . mirtazapine (REMERON) 30 MG tablet TAKE 1 TABLET BY MOUTH AT BEDTIME.  . valACYclovir (VALTREX) 1000 MG tablet Take 1 tablet (1,000 mg total) by mouth 2 (two) times daily. (Patient taking differently: Take 1,000 mg by mouth 2 (two) times daily as needed. )  . collagenase (SANTYL) ointment Apply 1 application topically daily.  Marland Kitchen gabapentin (NEURONTIN) 300 MG capsule Take 1 capsule (300 mg total) by mouth 3 (three) times daily.  . Silver (AQUACEL AG FOAM) 3.2"X3.2" PADS Apply 1 Film topically daily.  Marland Kitchen  sulfamethoxazole-trimethoprim (BACTRIM DS) 800-160 MG tablet Take 1 tablet by mouth 2 (two) times daily.   No facility-administered encounter medications on file as of 09/08/2019.    Surgical History: Past Surgical History:  Procedure Laterality Date  . COLONOSCOPY WITH PROPOFOL N/A 03/22/2019   Procedure: COLONOSCOPY WITH BIOPSIES;  Surgeon: Lucilla Lame, MD;  Location: [place];  Service: Endoscopy;  Laterality: N/A;  . I & D EXTREMITY Left 05/21/2019   Procedure: IRRIGATION AND DEBRIDEMENT LEFT LEG;  Surgeon: Herbert Pun, MD;  Location: [facility];  Service: General;  Laterality: Left;  . I & D EXTREMITY Left 05/25/2019   Procedure: IRRIGATION AND DEBRIDEMENT Left Leg;  Surgeon: Herbert Pun, MD;  Location: [facility];  Service: General;  Laterality: Left;  . POLYPECTOMY N/A 03/22/2019   Procedure: POLYPECTOMY;  Surgeon: Lucilla Lame, MD;  Location: [place];  Service: Endoscopy;  Laterality: N/A;    Medical History: Past Medical History:  Diagnosis Date  . Allergy   . COPD (chronic obstructive pulmonary disease) ([place])   . 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  . HIV (human immunodeficiency virus infection) ([place])   . Hypertension     Family History: Family History  Adopted: Yes    Social History   Socioeconomic History  . Marital status: Divorced    Spouse name: Not on file  . Number of children: Not on file  . Years of education: Not on file  .  Highest education level: Not on file  Occupational History  . Not on file  Tobacco Use  . Smoking status: Current Every Day Smoker    Packs/day: 1.50    Years: 30.00    Pack years: 45.00    Types: Cigarettes  . Smokeless tobacco: Never Used  . Tobacco comment: since age 34  Substance and Sexual Activity  . Alcohol use: Yes    Alcohol/week: 21.0 standard drinks    Types: 21 Shots of liquor per week    Comment: weekends  . Drug use: Never  . Sexual activity: Not on file  Other  Topics Concern  . Not on file  Social History Narrative  . Not on file   Social Determinants of Health   Financial Resource Strain:   . Difficulty of Paying Living Expenses:   Food Insecurity:   . Worried About Charity fundraiser in the Last Year:   . Arboriculturist in the Last Year:   Transportation Needs:   . Film/video editor (Medical):   Marland Kitchen Lack of Transportation (Non-Medical):   Physical Activity:   . Days of Exercise per Week:   . Minutes of Exercise per Session:   Stress:   . Feeling of Stress :   Social Connections:   . Frequency of Communication with Friends and Family:   . Frequency of Social Gatherings with Friends and Family:   . Attends Religious Services:   . Active Member of Clubs or Organizations:   . Attends Archivist Meetings:   Marland Kitchen Marital Status:   Intimate Partner Violence:   . Fear of Current or Ex-Partner:   . Emotionally Abused:   Marland Kitchen Physically Abused:   . Sexually Abused:       Review of Systems  Constitutional: Negative.  Negative for chills, fatigue and unexpected weight change.  HENT: Negative.  Negative for congestion, rhinorrhea, sneezing and sore throat.   Eyes: Negative for redness.  Respiratory: Negative.  Negative for cough, chest tightness and shortness of breath.   Cardiovascular: Negative.  Negative for chest pain and palpitations.  Gastrointestinal: Negative.  Negative for abdominal pain, constipation, diarrhea, nausea and vomiting.  Endocrine: Negative.   Genitourinary: Negative.  Negative for dysuria and frequency.  Musculoskeletal: Negative.  Negative for arthralgias, back pain, joint swelling and neck pain.  Skin: Negative.  Negative for rash.  Allergic/Immunologic: Negative.   Neurological: Negative.  Negative for tremors and numbness.  Hematological: Negative for adenopathy. Does not bruise/bleed easily.  Psychiatric/Behavioral: Negative.  Negative for behavioral problems, sleep disturbance and suicidal ideas.  The patient is not nervous/anxious.     Vital Signs: BP 123/86   Pulse 90   Temp 97.9 F (36.6 C)   Resp 16   Ht 5\' 9"  (1.753 m)   Wt 166 lb 12.8 oz (75.7 kg)   SpO2 94%   BMI 24.63 kg/m    Physical Exam Vitals and nursing note reviewed.  Constitutional:      General: He is not in acute distress.    Appearance: He is well-developed. He is not diaphoretic.  HENT:     Head: Normocephalic and atraumatic.     Mouth/Throat:     Pharynx: No oropharyngeal exudate.  Eyes:     Pupils: Pupils are equal, round, and reactive to light.  Neck:     Thyroid: No thyromegaly.     Vascular: No JVD.     Trachea: No tracheal deviation.  Cardiovascular:  Rate and Rhythm: Normal rate and regular rhythm.     Heart sounds: Normal heart sounds. No murmur. No friction rub. No gallop.   Pulmonary:     Effort: Pulmonary effort is normal. No respiratory distress.     Breath sounds: Normal breath sounds. No wheezing or rales.  Chest:     Chest wall: No tenderness.  Abdominal:     Palpations: Abdomen is soft.     Tenderness: There is no abdominal tenderness. There is no guarding.  Musculoskeletal:        General: Normal range of motion.     Cervical back: Normal range of motion and neck supple.  Lymphadenopathy:     Cervical: No cervical adenopathy.  Skin:    General: Skin is warm and dry.  Neurological:     Mental Status: He is alert and oriented to person, place, and time.     Cranial Nerves: No cranial nerve deficit.  Psychiatric:        Behavior: Behavior normal.        Thought Content: Thought content normal.        Judgment: Judgment normal.    Assessment/Plan: 1. Generalized anxiety disorder Controlled, continue lexapro.  2. HIV infection, unspecified symptom status ([place]) Controlled, continue current management with ID  3. Hypertension, unspecified type BP stable, continue present management.   4. Pain of left lower extremity - POCT ABI Screening Pilot No Charge - [place]  Lower Ext Art Bilat; Future  5. Nicotine dependence with current use Smoking cessation counseling: 1. Pt acknowledges the risks of long term smoking, she will try to quite smoking. 2. Options for different medications including nicotine products, chewing gum, patch etc, Wellbutrin and Chantix is discussed 3. Goal and date of compete cessation is discussed 4. Total time spent in smoking cessation is 15 min.   General Counseling: Marylou Mccoy understanding of the findings of todays visit and agrees with plan of treatment. I have discussed any further diagnostic evaluation that may be needed or ordered today. We also reviewed his medications today. he has been encouraged to call the office with any questions or concerns that should arise related to todays visit.    Orders Placed This Encounter  Procedures  . [place] Lower Ext Art Bilat  . POCT ABI Screening Pilot No Charge    No orders of the defined types were placed in this encounter.   Time spent: 30 Minutes   This patient was seen by Orson Gear AGNP-C in Collaboration with Dr Lavera Guise as a part of collaborative care agreement     Kendell Bane AGNP-C Internal medicine

## 2019-09-24 ENCOUNTER — Ambulatory Visit: Payer: BC Managed Care – PPO

## 2019-09-24 ENCOUNTER — Other Ambulatory Visit: Payer: Self-pay

## 2019-09-24 DIAGNOSIS — M79605 Pain in left leg: Secondary | ICD-10-CM

## 2019-09-26 NOTE — Procedures (Signed)
[place], [place] [id number]  DATE OF SERVICE: Sep 03, 2019  CAROTID DOPPLER INTERPRETATION:  Bilateral Carotid Ultrsasound and Color Doppler Examination was performed. The RIGHT CCA shows insignificant plaque in the vessel. The LEFT CCA shows insignificant plaque in the vessel. There was no intimal thickening noted in the RIGHT carotid artery. There was no intimal thickening in the LEFT carotid artery.  The RIGHT CCA shows peak systolic velocity of 77 cm per second. The end diastolic velocity is 30 cm per second on the RIGHT side. The RIGHT ICA shows peak systolic velocity of 74 per second. RIGHT sided ICA end diastolic velocity is 31 cm per second. The RIGHT ECA shows a peak systolic velocity of 94 cm per second. The ICA/CCA ratio is calculated to be 1.0. This suggests less than 50% stenosis. The Vertebral Artery shows antegrade flow.  The LEFT CCA shows peak systolic velocity of 93 cm per second. The end diastolic velocity is 33 cm per second on the LEFT side. The LEFT ICA shows peak systolic velocity of 77 per second. LEFT sided ICA end diastolic velocity is 36 cm per second. The LEFT ECA shows a peak systolic velocity of 99 cm per second. The ICA/CCA ratio is calculated to be 0.8. This suggests less than 50% stenosis. The Vertebral Artery shows antegrade flow.   Impression:    The RIGHT CAROTID shows less than 50% stenosis. The LEFT CAROTID shows less than 50% stenosis.  There is insignificant plaque formation noted on the LEFT and insignificant plaque on the RIGHT  side. Consider a repeat Carotid doppler if clinical situation and symptoms warrant in 6-12 months. Patient should be encouraged to change lifestyles such as smoking cessation, regular exercise and dietary modification. Use of statins in the right clinical setting and ASA is encouraged.  Allyne Gee, MD [facility] Pulmonary Critical Care Medicine

## 2019-09-27 DIAGNOSIS — Z7289 Other problems related to lifestyle: Secondary | ICD-10-CM | POA: Diagnosis not present

## 2019-09-27 DIAGNOSIS — B20 Human immunodeficiency virus [HIV] disease: Secondary | ICD-10-CM | POA: Diagnosis not present

## 2019-10-01 ENCOUNTER — Telehealth: Payer: Self-pay

## 2019-10-01 NOTE — Telephone Encounter (Signed)
Called lmom informing patient of appointment on 10/05/2019. klh 

## 2019-10-04 ENCOUNTER — Telehealth: Payer: Self-pay

## 2019-10-04 DIAGNOSIS — B20 Human immunodeficiency virus [HIV] disease: Secondary | ICD-10-CM | POA: Diagnosis not present

## 2019-10-04 NOTE — Telephone Encounter (Signed)
Confirmed appointment on 10/05/2019 and screened for covid. klh  

## 2019-10-05 ENCOUNTER — Ambulatory Visit (INDEPENDENT_AMBULATORY_CARE_PROVIDER_SITE_OTHER): Payer: BC Managed Care – PPO | Admitting: Adult Health

## 2019-10-05 ENCOUNTER — Other Ambulatory Visit: Payer: Self-pay

## 2019-10-05 ENCOUNTER — Encounter: Payer: Self-pay | Admitting: Adult Health

## 2019-10-05 VITALS — BP 147/80 | HR 87 | Temp 97.6°F | Resp 16 | Ht 69.0 in | Wt 167.8 lb

## 2019-10-05 DIAGNOSIS — F411 Generalized anxiety disorder: Secondary | ICD-10-CM | POA: Diagnosis not present

## 2019-10-05 DIAGNOSIS — F17200 Nicotine dependence, unspecified, uncomplicated: Secondary | ICD-10-CM

## 2019-10-05 DIAGNOSIS — I10 Essential (primary) hypertension: Secondary | ICD-10-CM

## 2019-10-05 NOTE — Progress Notes (Signed)
[facility] [place], [place] [address]  Internal MEDICINE  Office Visit Note  Patient Name: [name]  [id number]  [id number]  Date of Service: 10/05/2019  Chief Complaint  Patient presents with  . Follow-up    review [place]  . Hypertension    HPI Pt is here for follow up on [place] and HTN.  His blood pressure is slightly elevated.  Denies Chest pain, Shortness of breath, palpitations, headache, or blurred vision.  He has ultrasounds of his lower extremities, that show no significant stenosis.  He denies any new or worsening symptoms.     Current Medication: Outpatient Encounter Medications as of 10/05/2019  Medication Sig  . albuterol (VENTOLIN HFA) 108 (90 Base) MCG/ACT inhaler Inhale 1-2 puffs into the lungs every 6 (six) hours as needed for wheezing or shortness of breath.  . bictegravir-emtricitabine-tenofovir AF (BIKTARVY) 50-200-25 MG TABS tablet Take 1 tablet by mouth daily.   . cetirizine (ZYRTEC) 10 MG tablet Take 10 mg by mouth daily.  . cyclobenzaprine (FLEXERIL) 10 MG tablet TAKE 1 TABLET BY MOUTH TWICE A DAY AS NEEDED FOR MUSCLE SPASMS  . escitalopram (LEXAPRO) 20 MG tablet TAKE 1 TABLET BY MOUTH DAILY WITH SUPPER  . fluticasone (FLONASE) 50 MCG/ACT nasal spray Place 1 spray into both nostrils daily.  . hydrOXYzine (ATARAX/VISTARIL) 25 MG tablet Take 1 tablet (25 mg total) by mouth 2 (two) times daily.  Marland Kitchen lisinopril (ZESTRIL) 10 MG tablet Take 1 tablet (10 mg total) by mouth daily.  . mirtazapine (REMERON) 30 MG tablet TAKE 1 TABLET BY MOUTH AT BEDTIME.  . valACYclovir (VALTREX) 1000 MG tablet Take 1 tablet (1,000 mg total) by mouth 2 (two) times daily. (Patient taking differently: Take 1,000 mg by mouth 2 (two) times daily as needed. )  . collagenase (SANTYL) ointment Apply 1 application topically daily.  Marland Kitchen gabapentin (NEURONTIN) 300 MG capsule Take 1 capsule (300 mg total) by mouth 3 (three) times daily.  . Silver (AQUACEL AG FOAM) 3.2"X3.2" PADS  Apply 1 Film topically daily.  Marland Kitchen sulfamethoxazole-trimethoprim (BACTRIM DS) 800-160 MG tablet Take 1 tablet by mouth 2 (two) times daily.   No facility-administered encounter medications on file as of 10/05/2019.    Surgical History: Past Surgical History:  Procedure Laterality Date  . COLONOSCOPY WITH PROPOFOL N/A 03/22/2019   Procedure: COLONOSCOPY WITH BIOPSIES;  Surgeon: Lucilla Lame, MD;  Location: [place];  Service: Endoscopy;  Laterality: N/A;  . I & D EXTREMITY Left 05/21/2019   Procedure: IRRIGATION AND DEBRIDEMENT LEFT LEG;  Surgeon: Herbert Pun, MD;  Location: [facility];  Service: General;  Laterality: Left;  . I & D EXTREMITY Left 05/25/2019   Procedure: IRRIGATION AND DEBRIDEMENT Left Leg;  Surgeon: Herbert Pun, MD;  Location: [facility];  Service: General;  Laterality: Left;  . POLYPECTOMY N/A 03/22/2019   Procedure: POLYPECTOMY;  Surgeon: Lucilla Lame, MD;  Location: [place];  Service: Endoscopy;  Laterality: N/A;    Medical History: Past Medical History:  Diagnosis Date  . Allergy   . COPD (chronic obstructive pulmonary disease) ([place])   . 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  . HIV (human immunodeficiency virus infection) ([place])   . Hypertension     Family History: Family History  Adopted: Yes    Social History   Socioeconomic History  . Marital status: Divorced    Spouse name: Not on file  . Number of children: Not on file  . Years of  education: Not on file  . Highest education level: Not on file  Occupational History  . Not on file  Tobacco Use  . Smoking status: Current Every Day Smoker    Packs/day: 1.50    Years: 30.00    Pack years: 45.00    Types: Cigarettes  . Smokeless tobacco: Never Used  . Tobacco comment: since age 34  Vaping Use  . Vaping Use: Never used  Substance and Sexual Activity  . Alcohol use: Yes    Alcohol/week: 21.0 standard drinks    Types: 21 Shots of liquor per week    Comment:  weekends  . Drug use: Never  . Sexual activity: Not on file  Other Topics Concern  . Not on file  Social History Narrative  . Not on file   Social Determinants of Health   Financial Resource Strain:   . Difficulty of Paying Living Expenses:   Food Insecurity:   . Worried About Charity fundraiser in the Last Year:   . Arboriculturist in the Last Year:   Transportation Needs:   . Film/video editor (Medical):   Marland Kitchen Lack of Transportation (Non-Medical):   Physical Activity:   . Days of Exercise per Week:   . Minutes of Exercise per Session:   Stress:   . Feeling of Stress :   Social Connections:   . Frequency of Communication with Friends and Family:   . Frequency of Social Gatherings with Friends and Family:   . Attends Religious Services:   . Active Member of Clubs or Organizations:   . Attends Archivist Meetings:   Marland Kitchen Marital Status:   Intimate Partner Violence:   . Fear of Current or Ex-Partner:   . Emotionally Abused:   Marland Kitchen Physically Abused:   . Sexually Abused:       Review of Systems  Constitutional: Negative.  Negative for chills, fatigue and unexpected weight change.  HENT: Negative.  Negative for congestion, rhinorrhea, sneezing and sore throat.   Eyes: Negative for redness.  Respiratory: Negative.  Negative for cough, chest tightness and shortness of breath.   Cardiovascular: Negative.  Negative for chest pain and palpitations.  Gastrointestinal: Negative.  Negative for abdominal pain, constipation, diarrhea, nausea and vomiting.  Endocrine: Negative.   Genitourinary: Negative.  Negative for dysuria and frequency.  Musculoskeletal: Negative.  Negative for arthralgias, back pain, joint swelling and neck pain.  Skin: Negative.  Negative for rash.  Allergic/Immunologic: Negative.   Neurological: Negative.  Negative for tremors and numbness.  Hematological: Negative for adenopathy. Does not bruise/bleed easily.  Psychiatric/Behavioral: Negative.   Negative for behavioral problems, sleep disturbance and suicidal ideas. The patient is not nervous/anxious.     Vital Signs: BP (!) 147/80   Pulse 87   Temp 97.6 F (36.4 C)   Resp 16   Ht 5\' 9"  (1.753 m)   Wt 167 lb 12.8 oz (76.1 kg)   SpO2 98%   BMI 24.78 kg/m    Physical Exam Vitals and nursing note reviewed.  Constitutional:      General: He is not in acute distress.    Appearance: He is well-developed. He is not diaphoretic.  HENT:     Head: Normocephalic and atraumatic.     Mouth/Throat:     Pharynx: No oropharyngeal exudate.  Eyes:     Pupils: Pupils are equal, round, and reactive to light.  Neck:     Thyroid: No thyromegaly.     Vascular:  No JVD.     Trachea: No tracheal deviation.  Cardiovascular:     Rate and Rhythm: Normal rate and regular rhythm.     Heart sounds: Normal heart sounds. No murmur heard.  No friction rub. No gallop.   Pulmonary:     Effort: Pulmonary effort is normal. No respiratory distress.     Breath sounds: Normal breath sounds. No wheezing or rales.  Chest:     Chest wall: No tenderness.  Abdominal:     Palpations: Abdomen is soft.     Tenderness: There is no abdominal tenderness. There is no guarding.  Musculoskeletal:        General: Normal range of motion.     Cervical back: Normal range of motion and neck supple.  Lymphadenopathy:     Cervical: No cervical adenopathy.  Skin:    General: Skin is warm and dry.  Neurological:     Mental Status: He is alert and oriented to person, place, and time.     Cranial Nerves: No cranial nerve deficit.  Psychiatric:        Behavior: Behavior normal.        Thought Content: Thought content normal.        Judgment: Judgment normal.     Assessment/Plan: 1. Hypertension, unspecified type Continue to follow blood pressure as discussed.   2. Generalized anxiety disorder Stable, continue current management.   3. Nicotine dependence with current use Smoking cessation counseling: 1. Pt  acknowledges the risks of long term smoking, she will try to quite smoking. 2. Options for different medications including nicotine products, chewing gum, patch etc, Wellbutrin and Chantix is discussed 3. Goal and date of compete cessation is discussed 4. Total time spent in smoking cessation is 15 min.   General Counseling: Marylou Mccoy understanding of the findings of todays visit and agrees with plan of treatment. I have discussed any further diagnostic evaluation that may be needed or ordered today. We also reviewed his medications today. he has been encouraged to call the office with any questions or concerns that should arise related to todays visit.    No orders of the defined types were placed in this encounter.   No orders of the defined types were placed in this encounter.   Time spent: 25 Minutes   This patient was seen by Orson Gear AGNP-C in Collaboration with Dr Lavera Guise as a part of collaborative care agreement     Kendell Bane AGNP-C Internal medicine

## 2019-10-07 ENCOUNTER — Other Ambulatory Visit: Payer: Self-pay | Admitting: Internal Medicine

## 2019-10-07 ENCOUNTER — Other Ambulatory Visit: Payer: Self-pay | Admitting: Adult Health

## 2019-10-07 DIAGNOSIS — F419 Anxiety disorder, unspecified: Secondary | ICD-10-CM

## 2019-10-07 MED ORDER — MIRTAZAPINE 30 MG PO TABS: 30.0000 mg | ORAL_TABLET | Freq: Every day | ORAL | 3 refills | Status: DC

## 2019-10-14 DIAGNOSIS — I10 Essential (primary) hypertension: Secondary | ICD-10-CM | POA: Diagnosis not present

## 2019-10-14 DIAGNOSIS — J45909 Unspecified asthma, uncomplicated: Secondary | ICD-10-CM | POA: Diagnosis not present

## 2019-10-14 DIAGNOSIS — F329 Major depressive disorder, single episode, unspecified: Secondary | ICD-10-CM | POA: Diagnosis not present

## 2019-10-14 DIAGNOSIS — B20 Human immunodeficiency virus [HIV] disease: Secondary | ICD-10-CM | POA: Diagnosis not present

## 2019-10-14 DIAGNOSIS — F17210 Nicotine dependence, cigarettes, uncomplicated: Secondary | ICD-10-CM | POA: Diagnosis not present

## 2019-10-14 DIAGNOSIS — Z720 Tobacco use: Secondary | ICD-10-CM | POA: Diagnosis not present

## 2019-10-14 DIAGNOSIS — Z7289 Other problems related to lifestyle: Secondary | ICD-10-CM | POA: Diagnosis not present

## 2019-10-14 DIAGNOSIS — Z6824 Body mass index (BMI) 24.0-24.9, adult: Secondary | ICD-10-CM | POA: Diagnosis not present

## 2019-10-14 DIAGNOSIS — F419 Anxiety disorder, unspecified: Secondary | ICD-10-CM | POA: Diagnosis not present

## 2019-10-14 DIAGNOSIS — Z602 Problems related to living alone: Secondary | ICD-10-CM | POA: Diagnosis not present

## 2019-10-14 DIAGNOSIS — R85610 Atypical squamous cells of undetermined significance on cytologic smear of anus (ASC-US): Secondary | ICD-10-CM | POA: Diagnosis not present

## 2019-10-15 ENCOUNTER — Other Ambulatory Visit: Payer: Self-pay

## 2019-10-15 DIAGNOSIS — Z1152 Encounter for screening for COVID-19: Secondary | ICD-10-CM

## 2019-10-20 DIAGNOSIS — Z1152 Encounter for screening for COVID-19: Secondary | ICD-10-CM | POA: Diagnosis not present

## 2019-10-21 LAB — SAR COV2 SEROLOGY (COVID19)AB(IGG),IA: DiaSorin SARS-CoV-2 Ab, IgG: NEGATIVE

## 2019-11-01 ENCOUNTER — Other Ambulatory Visit: Payer: Self-pay | Admitting: Adult Health

## 2019-11-01 DIAGNOSIS — I10 Essential (primary) hypertension: Secondary | ICD-10-CM

## 2019-11-02 ENCOUNTER — Other Ambulatory Visit: Payer: Self-pay

## 2019-11-29 DIAGNOSIS — L578 Other skin changes due to chronic exposure to nonionizing radiation: Secondary | ICD-10-CM | POA: Diagnosis not present

## 2019-11-29 DIAGNOSIS — L281 Prurigo nodularis: Secondary | ICD-10-CM | POA: Diagnosis not present

## 2019-12-07 ENCOUNTER — Other Ambulatory Visit: Payer: Self-pay | Admitting: Adult Health

## 2019-12-07 DIAGNOSIS — J209 Acute bronchitis, unspecified: Secondary | ICD-10-CM

## 2019-12-12 ENCOUNTER — Other Ambulatory Visit: Payer: Self-pay | Admitting: Adult Health

## 2019-12-12 DIAGNOSIS — J209 Acute bronchitis, unspecified: Secondary | ICD-10-CM

## 2019-12-12 DIAGNOSIS — F411 Generalized anxiety disorder: Secondary | ICD-10-CM

## 2019-12-12 DIAGNOSIS — B001 Herpesviral vesicular dermatitis: Secondary | ICD-10-CM

## 2020-01-10 DIAGNOSIS — L281 Prurigo nodularis: Secondary | ICD-10-CM | POA: Diagnosis not present

## 2020-01-26 ENCOUNTER — Other Ambulatory Visit: Payer: Self-pay | Admitting: Adult Health

## 2020-03-07 DIAGNOSIS — L989 Disorder of the skin and subcutaneous tissue, unspecified: Secondary | ICD-10-CM | POA: Diagnosis not present

## 2020-03-08 DIAGNOSIS — L989 Disorder of the skin and subcutaneous tissue, unspecified: Secondary | ICD-10-CM | POA: Diagnosis not present

## 2020-03-08 DIAGNOSIS — R234 Changes in skin texture: Secondary | ICD-10-CM | POA: Diagnosis not present

## 2020-03-31 DIAGNOSIS — Z13220 Encounter for screening for lipoid disorders: Secondary | ICD-10-CM | POA: Diagnosis not present

## 2020-03-31 DIAGNOSIS — F10929 Alcohol use, unspecified with intoxication, unspecified: Secondary | ICD-10-CM | POA: Diagnosis not present

## 2020-03-31 DIAGNOSIS — Z131 Encounter for screening for diabetes mellitus: Secondary | ICD-10-CM | POA: Diagnosis not present

## 2020-03-31 DIAGNOSIS — Z1321 Encounter for screening for nutritional disorder: Secondary | ICD-10-CM | POA: Diagnosis not present

## 2020-04-11 DIAGNOSIS — Z7289 Other problems related to lifestyle: Secondary | ICD-10-CM | POA: Diagnosis not present

## 2020-04-11 DIAGNOSIS — E781 Pure hyperglyceridemia: Secondary | ICD-10-CM | POA: Diagnosis not present

## 2020-04-11 DIAGNOSIS — Z720 Tobacco use: Secondary | ICD-10-CM | POA: Diagnosis not present

## 2020-04-11 DIAGNOSIS — B20 Human immunodeficiency virus [HIV] disease: Secondary | ICD-10-CM | POA: Diagnosis not present

## 2020-04-24 DIAGNOSIS — Z7289 Other problems related to lifestyle: Secondary | ICD-10-CM | POA: Diagnosis not present

## 2020-04-24 DIAGNOSIS — E781 Pure hyperglyceridemia: Secondary | ICD-10-CM | POA: Diagnosis not present

## 2020-04-24 DIAGNOSIS — B20 Human immunodeficiency virus [HIV] disease: Secondary | ICD-10-CM | POA: Diagnosis not present

## 2020-05-12 ENCOUNTER — Other Ambulatory Visit: Payer: Self-pay | Admitting: Adult Health

## 2020-05-18 ENCOUNTER — Other Ambulatory Visit: Payer: Self-pay | Admitting: Adult Health

## 2020-05-18 DIAGNOSIS — F411 Generalized anxiety disorder: Secondary | ICD-10-CM

## 2020-05-19 ENCOUNTER — Other Ambulatory Visit: Payer: Self-pay

## 2020-05-19 DIAGNOSIS — F411 Generalized anxiety disorder: Secondary | ICD-10-CM

## 2020-05-19 MED ORDER — ESCITALOPRAM OXALATE 20 MG PO TABS: ORAL_TABLET | ORAL | 0 refills | Status: DC

## 2020-05-19 NOTE — Telephone Encounter (Signed)
Pt advised need appt for further refills and gave kim to make appt

## 2020-06-12 ENCOUNTER — Ambulatory Visit: Payer: BC Managed Care – PPO | Admitting: Physician Assistant

## 2020-06-12 ENCOUNTER — Other Ambulatory Visit: Payer: Self-pay

## 2020-06-12 ENCOUNTER — Encounter: Payer: Self-pay | Admitting: Physician Assistant

## 2020-06-12 VITALS — BP 138/84 | HR 88 | Temp 97.0°F | Resp 16 | Ht 69.0 in | Wt 168.2 lb

## 2020-06-12 DIAGNOSIS — Z21 Asymptomatic human immunodeficiency virus [HIV] infection status: Secondary | ICD-10-CM

## 2020-06-12 DIAGNOSIS — G479 Sleep disorder, unspecified: Secondary | ICD-10-CM | POA: Diagnosis not present

## 2020-06-12 DIAGNOSIS — J449 Chronic obstructive pulmonary disease, unspecified: Secondary | ICD-10-CM

## 2020-06-12 DIAGNOSIS — I10 Essential (primary) hypertension: Secondary | ICD-10-CM | POA: Diagnosis not present

## 2020-06-12 DIAGNOSIS — E509 Vitamin A deficiency, unspecified: Secondary | ICD-10-CM

## 2020-06-12 DIAGNOSIS — F17209 Nicotine dependence, unspecified, with unspecified nicotine-induced disorders: Secondary | ICD-10-CM

## 2020-06-12 DIAGNOSIS — F411 Generalized anxiety disorder: Secondary | ICD-10-CM

## 2020-06-12 DIAGNOSIS — Z125 Encounter for screening for malignant neoplasm of prostate: Secondary | ICD-10-CM | POA: Diagnosis not present

## 2020-06-12 DIAGNOSIS — E538 Deficiency of other specified B group vitamins: Secondary | ICD-10-CM

## 2020-06-12 DIAGNOSIS — R5383 Other fatigue: Secondary | ICD-10-CM

## 2020-06-12 DIAGNOSIS — B20 Human immunodeficiency virus [HIV] disease: Secondary | ICD-10-CM

## 2020-06-12 NOTE — Progress Notes (Signed)
[facility] [place], [place] [address]  Internal MEDICINE  Office Visit Note  Patient Name: [name]  [id number]  [id number]  Date of Service: 06/13/2020  Chief Complaint  Patient presents with  . Follow-up    Refill request  . Hypertension  . COPD    HPI Pt returns today for f/u. -HIV: Sees ID regularly.  Martin Majestic to dermatologist recently and got cream and antibiotic for rash on both legs that seems to be improving. -HTN: Takes lisinopril daily. -Takes albuterol inhaler as needed 1-2 per week. No daily medication. -Takes lexapro and hydroxyzine for GAD. No longer taking mirtazapine because it didn't seem to help him any and did not help him sleep. -Sleep has been a problem for him for a long time. He has very vivid dreams that wake him up. He also goes to the bathroom a few times per night too. He thinks he only gets 3-5 hours of sleep total, and does not feel it is good quality. He has never done a sleep study. He also reports being very active in his sleep with blankets and pillows tossed. He would benefit from a sleep study. -Still smokes 1.5ppd. Interested in cutting back, but winter is hard. Hasnt been to the gym which usually helps him cut back smoking and gets him in a better routine. He is planning to get back into his gym routine and aims to get down to <1ppd.  Current Medication: Outpatient Encounter Medications as of 06/12/2020  Medication Sig  . albuterol (VENTOLIN HFA) 108 (90 Base) MCG/ACT inhaler INHALE 1-2 PUFFS INTO THE LUNGS EVERY 6 (SIX) HOURS AS NEEDED FOR WHEEZING OR SHORTNESS OF BREATH.  . bictegravir-emtricitabine-tenofovir AF (BIKTARVY) 50-200-25 MG TABS tablet Take 1 tablet by mouth daily.   . cetirizine (ZYRTEC) 10 MG tablet Take 10 mg by mouth daily.  . cyclobenzaprine (FLEXERIL) 10 MG tablet TAKE 1 TABLET BY MOUTH TWICE A DAY AS NEEDED FOR MUSCLE SPASM  . escitalopram (LEXAPRO) 20 MG tablet Take 1 tab po daily with supper  .  fluticasone (FLONASE) 50 MCG/ACT nasal spray Place 1 spray into both nostrils daily.  . hydrOXYzine (ATARAX/VISTARIL) 25 MG tablet TAKE 1 TABLET BY MOUTH TWICE A DAY  . lisinopril (ZESTRIL) 10 MG tablet TAKE 1 TABLET BY MOUTH EVERY DAY  . valACYclovir (VALTREX) 1000 MG tablet TAKE 1 TABLET BY MOUTH TWICE A DAY  . collagenase (SANTYL) ointment Apply 1 application topically daily.  Marland Kitchen gabapentin (NEURONTIN) 300 MG capsule Take 1 capsule (300 mg total) by mouth 3 (three) times daily.  . mirtazapine (REMERON) 30 MG tablet Take 1 tablet (30 mg total) by mouth at bedtime. (Patient not taking: Reported on 06/12/2020)  . Silver (AQUACEL AG FOAM) 3.2"X3.2" PADS Apply 1 Film topically daily.  Marland Kitchen sulfamethoxazole-trimethoprim (BACTRIM DS) 800-160 MG tablet Take 1 tablet by mouth 2 (two) times daily.   No facility-administered encounter medications on file as of 06/12/2020.    Surgical History: Past Surgical History:  Procedure Laterality Date  . COLONOSCOPY WITH PROPOFOL N/A 03/22/2019   Procedure: COLONOSCOPY WITH BIOPSIES;  Surgeon: Lucilla Lame, MD;  Location: [facility];  Service: Endoscopy;  Laterality: N/A;  . I & D EXTREMITY Left 05/21/2019   Procedure: IRRIGATION AND DEBRIDEMENT LEFT LEG;  Surgeon: Herbert Pun, MD;  Location: [facility];  Service: General;  Laterality: Left;  . I & D EXTREMITY Left 05/25/2019   Procedure: IRRIGATION AND DEBRIDEMENT Left Leg;  Surgeon: Herbert Pun, MD;  Location:  [facility];  Service: General;  Laterality: Left;  . POLYPECTOMY N/A 03/22/2019   Procedure: POLYPECTOMY;  Surgeon: Lucilla Lame, MD;  Location: [place];  Service: Endoscopy;  Laterality: N/A;    Medical History: Past Medical History:  Diagnosis Date  . Allergy   . COPD (chronic obstructive pulmonary disease) ([place])   . 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  . HIV (human immunodeficiency virus infection) ([place])   . Hypertension     Family History: Family  History  Adopted: Yes    Social History   Socioeconomic History  . Marital status: Divorced    Spouse name: Not on file  . Number of children: Not on file  . Years of education: Not on file  . Highest education level: Not on file  Occupational History  . Not on file  Tobacco Use  . Smoking status: Current Every Day Smoker    Packs/day: 1.50    Years: 30.00    Pack years: 45.00    Types: Cigarettes  . Smokeless tobacco: Never Used  . Tobacco comment: since age 87  Vaping Use  . Vaping Use: Never used  Substance and Sexual Activity  . Alcohol use: Yes    Alcohol/week: 21.0 standard drinks    Types: 21 Shots of liquor per week    Comment: weekends  . Drug use: Never  . Sexual activity: Not on file  Other Topics Concern  . Not on file  Social History Narrative  . Not on file   Social Determinants of Health   Financial Resource Strain: Not on file  Food Insecurity: Not on file  Transportation Needs: Not on file  Physical Activity: Not on file  Stress: Not on file  Social Connections: Not on file  Intimate Partner Violence: Not on file      Review of Systems  Constitutional: Negative for chills, fatigue and unexpected weight change.  HENT: Negative for congestion, postnasal drip, rhinorrhea, sneezing and sore throat.   Eyes: Negative for redness.  Respiratory: Negative for cough, chest tightness and shortness of breath.   Cardiovascular: Negative for chest pain, palpitations and leg swelling.  Gastrointestinal: Negative for abdominal pain, constipation, diarrhea, nausea and vomiting.  Genitourinary: Negative for dysuria and frequency.  Musculoskeletal: Negative for arthralgias, back pain, joint swelling and neck pain.  Skin: Positive for rash.       Bilateral LE, seen by derm  Allergic/Immunologic: Positive for immunocompromised state.  Neurological: Negative.  Negative for tremors and numbness.  Hematological: Negative for adenopathy. Does not bruise/bleed  easily.  Psychiatric/Behavioral: Positive for sleep disturbance. Negative for behavioral problems (Depression) and suicidal ideas. The patient is nervous/anxious.     Vital Signs: BP 138/84   Pulse 88   Temp (!) 97 F (36.1 C)   Resp 16   Ht 5\' 9"  (1.753 m)   Wt 168 lb 3.2 oz (76.3 kg)   SpO2 99%   BMI 24.84 kg/m    Physical Exam Constitutional:      General: He is not in acute distress.    Appearance: He is well-developed and normal weight. He is not diaphoretic.  HENT:     Head: Normocephalic and atraumatic.     Mouth/Throat:     Pharynx: No oropharyngeal exudate.  Eyes:     Pupils: Pupils are equal, round, and reactive to light.  Neck:     Thyroid: No thyromegaly.     Vascular: No JVD.     Trachea:  No tracheal deviation.  Cardiovascular:     Rate and Rhythm: Normal rate and regular rhythm.     Heart sounds: Normal heart sounds. No murmur heard. No friction rub. No gallop.   Pulmonary:     Effort: Pulmonary effort is normal. No respiratory distress.     Breath sounds: No wheezing or rales.  Chest:     Chest wall: No tenderness.  Abdominal:     General: Bowel sounds are normal.     Palpations: Abdomen is soft.  Musculoskeletal:        General: Normal range of motion.     Cervical back: Normal range of motion and neck supple.  Lymphadenopathy:     Cervical: No cervical adenopathy.  Skin:    General: Skin is warm and dry.     Findings: Rash present.     Comments: Bilateral LE, followed by dermatology  Neurological:     Mental Status: He is alert and oriented to person, place, and time.     Cranial Nerves: No cranial nerve deficit.  Psychiatric:        Behavior: Behavior normal.        Thought Content: Thought content normal.        Judgment: Judgment normal.        Assessment/Plan: 1. Hypertension, unspecified type Well controlled on lisinopril, continue current dose.  2. Generalized anxiety disorder Stable on Lexapro and hydroxyzine. He stopped  the mirtazapine because it did not help.  3. Sleep disturbance Pt complains of overly vivid dreams, lots of movement in his sleep, and overall poor quality of sleep lasting 3-5 hours per night. This leaves him sleepy during the daytime. He will need a sleep study to further evaluate this. - [facility] SLEEP STUDY; Future  4. HIV infection, unspecified symptom status ([place]) Followed by ID, on Biktarvy.  5. Nicotine dependence with nicotine-induced disorder, unspecified nicotine product type Discussed getting back into regular gym  Routine which helps him cut back on smoking. Aims to get down to <1ppd.  Smoking cessation counseling: 1. Pt acknowledges the risks of long term smoking, she will try to quite smoking. 2. Options for different medications including nicotine products, chewing gum, patch etc, Wellbutrin and Chantix is discussed 3. Goal and date of compete cessation is discussed 4. Total time spent in smoking cessation is 10 min.  6. Chronic obstructive pulmonary disease, unspecified COPD type ([place]) Stable. Uses albuterol only as needed, usually 1-2 times per week.  7. B12 deficiency - B12 and Folate Panel  8. Vitamin A deficiency - Vitamin D (25 hydroxy)  9. Other fatigue Ordered age appropriate labs to be done before physical. Previously elevated TG, will likely need Fenofibrate depending on what labs show. - CBC with Differential/Platelet; Future - Lipid Panel With LDL/HDL Ratio; Future - TSH; Future - T4, free; Future - Comprehensive metabolic panel - PSA, total and free - T4, free - TSH - Lipid Panel With LDL/HDL Ratio - CBC with Differential/Platelet - [facility] SLEEP STUDY; Future  10. Special screening for malignant neoplasm of prostate - PSA, total and free  General Counseling: Ramona verbalizes understanding of the findings of todays visit and agrees with plan of treatment. I have discussed any further diagnostic evaluation that may be needed or ordered today. We also  reviewed his medications today. he has been encouraged to call the office with any questions or concerns that should arise related to todays visit.    Orders Placed This Encounter  Procedures  .  CBC with Differential/Platelet  . Lipid Panel With LDL/HDL Ratio  . TSH  . T4, free  . Comprehensive metabolic panel  . PSA, total and free  . B12 and Folate Panel  . Vitamin D (25 hydroxy)  . PSG SLEEP STUDY    No orders of the defined types were placed in this encounter.   Total time spent:30 Minutes Time spent includes review of chart, medications, test results, and follow up plan with the patient.      Dr Lavera Guise Internal medicine

## 2020-06-16 ENCOUNTER — Telehealth: Payer: Self-pay

## 2020-06-16 NOTE — Telephone Encounter (Signed)
Gave orders for sleep study with [facility]. Kennya Schwenn

## 2020-07-10 ENCOUNTER — Other Ambulatory Visit: Payer: Self-pay

## 2020-07-10 DIAGNOSIS — F411 Generalized anxiety disorder: Secondary | ICD-10-CM

## 2020-07-10 MED ORDER — CYCLOBENZAPRINE HCL 10 MG PO TABS: ORAL_TABLET | ORAL | 1 refills | Status: DC

## 2020-07-10 MED ORDER — ESCITALOPRAM OXALATE 20 MG PO TABS: ORAL_TABLET | ORAL | 1 refills | Status: DC

## 2020-07-12 ENCOUNTER — Other Ambulatory Visit: Payer: Self-pay

## 2020-07-12 DIAGNOSIS — F411 Generalized anxiety disorder: Secondary | ICD-10-CM

## 2020-07-12 MED ORDER — ESCITALOPRAM OXALATE 20 MG PO TABS: ORAL_TABLET | ORAL | 0 refills | Status: DC

## 2020-07-19 ENCOUNTER — Other Ambulatory Visit: Payer: Self-pay

## 2020-07-19 ENCOUNTER — Encounter (INDEPENDENT_AMBULATORY_CARE_PROVIDER_SITE_OTHER): Payer: BC Managed Care – PPO | Admitting: Internal Medicine

## 2020-07-19 DIAGNOSIS — I10 Essential (primary) hypertension: Secondary | ICD-10-CM

## 2020-07-19 DIAGNOSIS — G4733 Obstructive sleep apnea (adult) (pediatric): Secondary | ICD-10-CM | POA: Diagnosis not present

## 2020-07-19 MED ORDER — LISINOPRIL 10 MG PO TABS: 10.0000 mg | ORAL_TABLET | Freq: Every day | ORAL | 1 refills | Status: DC

## 2020-07-21 ENCOUNTER — Other Ambulatory Visit: Payer: Self-pay

## 2020-07-21 DIAGNOSIS — I10 Essential (primary) hypertension: Secondary | ICD-10-CM

## 2020-07-21 MED ORDER — LISINOPRIL 10 MG PO TABS: 10.0000 mg | ORAL_TABLET | Freq: Every day | ORAL | 1 refills | Status: DC

## 2020-07-24 ENCOUNTER — Other Ambulatory Visit: Payer: Self-pay | Admitting: Hospice and Palliative Medicine

## 2020-07-31 DIAGNOSIS — E509 Vitamin A deficiency, unspecified: Secondary | ICD-10-CM | POA: Diagnosis not present

## 2020-07-31 DIAGNOSIS — R5383 Other fatigue: Secondary | ICD-10-CM | POA: Diagnosis not present

## 2020-07-31 DIAGNOSIS — E538 Deficiency of other specified B group vitamins: Secondary | ICD-10-CM | POA: Diagnosis not present

## 2020-07-31 LAB — CBC WITH DIFFERENTIAL/PLATELET
Basophils Absolute: 0.1 10*3/uL (ref 0.0–0.2)
Basos: 1 %
EOS (ABSOLUTE): 0.1 10*3/uL (ref 0.0–0.4)
Eos: 1 %
Hematocrit: 45.7 % (ref 37.5–51.0)
Hemoglobin: 15.8 g/dL (ref 13.0–17.7)
Immature Grans (Abs): 0 10*3/uL (ref 0.0–0.1)
Immature Granulocytes: 0 %
Lymphocytes Absolute: 2 10*3/uL (ref 0.7–3.1)
Lymphs: 26 %
MCH: 34.2 pg — ABNORMAL HIGH (ref 26.6–33.0)
MCHC: 34.6 g/dL (ref 31.5–35.7)
MCV: 99 fL — ABNORMAL HIGH (ref 79–97)
Monocytes Absolute: 0.6 10*3/uL (ref 0.1–0.9)
Monocytes: 8 %
Neutrophils Absolute: 4.9 10*3/uL (ref 1.4–7.0)
Neutrophils: 64 %
Platelets: 275 10*3/uL (ref 150–450)
RBC: 4.62 x10E6/uL (ref 4.14–5.80)
RDW: 13.2 % (ref 11.6–15.4)
WBC: 7.6 10*3/uL (ref 3.4–10.8)

## 2020-08-01 LAB — COMPREHENSIVE METABOLIC PANEL
ALT: 36 IU/L (ref 0–44)
AST: 51 IU/L — ABNORMAL HIGH (ref 0–40)
Albumin/Globulin Ratio: 1.7 (ref 1.2–2.2)
Albumin: 4.2 g/dL (ref 3.8–4.9)
Alkaline Phosphatase: 78 IU/L (ref 44–121)
BUN/Creatinine Ratio: 8 — ABNORMAL LOW (ref 9–20)
BUN: 9 mg/dL (ref 6–24)
Bilirubin Total: 0.7 mg/dL (ref 0.0–1.2)
CO2: 23 mmol/L (ref 20–29)
Calcium: 9.1 mg/dL (ref 8.7–10.2)
Chloride: 100 mmol/L (ref 96–106)
Creatinine, Ser: 1.08 mg/dL (ref 0.76–1.27)
Globulin, Total: 2.5 g/dL (ref 1.5–4.5)
Glucose: 89 mg/dL (ref 65–99)
Potassium: 4 mmol/L (ref 3.5–5.2)
Sodium: 138 mmol/L (ref 134–144)
Total Protein: 6.7 g/dL (ref 6.0–8.5)
eGFR: 83 mL/min/{1.73_m2} (ref >59)

## 2020-08-01 LAB — LIPID PANEL WITH LDL/HDL RATIO
Cholesterol, Total: 160 mg/dL (ref 100–199)
HDL: 32 mg/dL — ABNORMAL LOW (ref >39)
LDL Chol Calc (NIH): 53 mg/dL (ref 0–99)
LDL/HDL Ratio: 1.7 ratio (ref 0.0–3.6)
Triglycerides: 505 mg/dL — ABNORMAL HIGH (ref 0–149)
VLDL Cholesterol Cal: 75 mg/dL — ABNORMAL HIGH (ref 5–40)

## 2020-08-01 LAB — TSH: TSH: 2.55 u[IU]/mL (ref 0.450–4.500)

## 2020-08-01 LAB — VITAMIN D 25 HYDROXY (VIT D DEFICIENCY, FRACTURES): Vit D, 25-Hydroxy: 42.6 ng/mL (ref 30.0–100.0)

## 2020-08-01 LAB — B12 AND FOLATE PANEL
Folate: 13.1 ng/mL (ref >3.0)
Vitamin B-12: 785 pg/mL (ref 232–1245)

## 2020-08-01 LAB — T4, FREE: Free T4: 0.75 ng/dL — ABNORMAL LOW (ref 0.82–1.77)

## 2020-08-01 NOTE — Procedures (Signed)
[facility] Report Part I                                                               Phone: [phone] Fax: [phone]  Patient Name: [name], [name] Acquisition Number: [id number]  Date of Birth: 04-22-1966 Acquisition Date: 07/19/2020  Referring Physician: Theodoro Grist, NP     History: The patient is a 55 year old male who was referred for evaluation of possible sleep apnea. Medical History: anxiety, hypertension, COPD.  Medications: lisinopril, escitalopram.  Procedure: This routine overnight polysomnogram was performed on the [facility] 5 using the standard diagnostic protocol. This included 6 channels of EEG, 2 channels of EOG, chin EMG, bilateral anterior tibialis EMG, nasal/oral thermistor, PTAF (nasal pressure transducer), chest and abdominal wall movements, EKG, and pulse oximetry.  Description: The total recording time was 405.2 minutes. The total sleep time was 118.0 minutes. There were a total of 112.9 minutes of wakefulness after sleep onset for a poor??sleep efficiency of 29.1%. The latency to sleep onset was?prolonged at 174.3 minutes. The R sleep onset latency was ?slightly prolonged at 124.0 minutes.? Sleep parameters, as a percentage of the total sleep time, demonstrated 6.4% of sleep was in N1 sleep, 71.6% N2, 7.6% N3 and 14.4% R sleep. There were a total of 18 arousals for an arousal index of 9.2 arousals per hour of sleep that was normal.???  Respiratory monitoring demonstrated occasional mild degree of snoring in all positions. There were 21 apneas and hypopneas for an Apnea Hypopnea Index of 10.7 apneas and hypopneas per hour of sleep. The REM related apnea hypopnea index was 10.6/hr of REM sleep compared to a NREM AHI of 10.1/hr.  The average duration of the respiratory events was 23.3 seconds with a maximum duration of 32.0 seconds. The respiratory events occurred mostly in the supine position with an AHI of 26.7. The respiratory  events were associated with peripheral oxygen desaturations on the average to 90%. The lowest oxygen desaturation associated with a respiratory event was 86%. Additionally, the baseline oxygen saturation during wakefulness was 96%, during NREM sleep averaged 94%, and during REM sleep averaged 94%. The total duration of oxygen < 90% was 1.8 minutes.  Cardiac monitoring- did not demonstrate transient cardiac decelerations associated with the apneas. Intermittent PVCs with runs of quadrigeminy were observed.   Periodic limb movement monitoring- demonstrated that there were 10 periodic limb movements for a periodic limb movement index of 5.1 periodic limb movements per hour of sleep. Quasi-periodic limb movements were observed during periods of wakefulness.     Impression: ???Despite limited sleep time, this routine overnight polysomnogram demonstrated significant, position-dependent obstructive sleep apnea with an overall Apnea Hypopnea Index of 10.7 apneas and hypopneas per hour of sleep, which increased to 26.7 in the supine position.  ? There were few periodic limb movements that commonly are not significant, however quasi-periodic limb movements were observed during periods of wakefulness. Clinical correlation would be suggested.   There was a poor sleep efficiency with???increased awakenings?and reduced percentages of REM and slow wave sleep. These findings would appear to be due to the combination of obstructive sleep apnea and limb movements.??????????????  ?Recommendations:    1. A CPAP titration would  be recommended for the sleep apnea.  2. Alternative treatment options may include: avoiding the supine sleep position, a nasal resistance device, an oral appliance or ENT surgery in the appropriate clinical context. ?    Allyne Gee, MD, [facility] Diplomate ABMS-Pulmonary, Critical Care and Sleep Medicine  Electronically reviewed and digitally signed       [facility] Report Part II  Phone: [phone] Fax: [phone]  Patient last name Birchler Neck Size 15.5 in. Acquisition [id number]  Patient first name Om Weight 170.0 lbs. Started 07/19/2020 at 10:35:28 PM  Birth date [birth year] Height 69.0 in. Stopped 07/20/2020 at 5:28:46 AM  Age 71 BMI 25.1 lb/in2 Duration 405.2  Study Type Adult      Adriana Mccallum, RPSGT Sleep Data: Lights Out: 10:40:10 PM Sleep Onset: 1:34:28 AM  Lights On: 5:25:22 AM Sleep Efficiency: 29.1 %  Total Recording Time: 405.2 min Sleep Latency (from Lights Off) 174.3 min  Total Sleep Time (TST): 118.0 min R Latency (from Sleep Onset): 124.0 min  Sleep Period Time: 230.0 min Total number of awakenings: 20  Wake during sleep: 112.0 min Wake After Sleep Onset (WASO): 112.9 min   Sleep Data:         Arousal Summary: Stage  Latency from lights out (min) Latency from sleep onset (min) Duration (min) % Total Sleep Time  Normal values  N 1 174.3 0.0 7.5 6.4 (5%)  N 2 174.8 0.5 84.5 71.6 (50%)  N 3 181.3 7.0 9.0 7.6 (20%)  R 298.3 124.0 17.0 14.4 (25%)    Number Index  Spontaneous 24 12.2  Apneas & Hypopneas 9 4.6  RERAs 0 0.0       (Apneas & Hypopneas & RERAs)  (9) (4.6)  Limb Movement 5 2.5  Snore 0 0.0  TOTAL 38 19.3      Respiratory Data:  CA OA MA Apnea Hypopnea* A+ H RERA Total  Number 0 10 0 10 11 21  0 21  Mean Dur (sec) 0.0 17.9 0.0 17.9 28.3 23.3 0.0 23.3  Max Dur (sec) 0.0 23.0 0.0 23.0 32.0 32.0 0.0 32.0  Total Dur (min) 0.0 3.0 0.0 3.0 5.2 8.2 0.0 8.2  % of TST 0.0 2.5 0.0 2.5 4.4 6.9 0.0 6.9  Index (#/h TST) 0.0 5.1 0.0 5.1 5.6 10.7 0.0 10.7  *Hypopneas scored based on 4% or greater desaturation.  Sleep Stage:        REM NREM TST  AHI 10.6 10.1 10.7  RDI 10.6 10.1 10.7            Body Position Data:  Sleep (min) TST (%) REM (min) NREM (min) CA (#) OA (#) MA (#) HYP (#) AHI (#/h) RERA (#) RDI (#/h) Desat (#)  Supine 40.4 34.24 8.5 31.9 0 8 0 10 26.7 0 26.7 13   Non-Supine 77.60 65.76 8.50 69.10 0.00 2.00 0.00 1.00 2.32 0 2.32 1.00  Left: 12.1 10.25 3.5 8.6 0 0 0 0 0.0 0 0.00 0  Right: 65.5 55.51 5.0 60.5 0 2 0 1 2.7 0 2.7 1  UP: 0.0 0.00 0.0 0.0 0 0 0 0 0.0 0 0.00 0     Snoring: Total number of snoring episodes  0  Total time with snoring    min (   % of sleep)   Oximetry Distribution:             WK REM NREM TOTAL  Average (%)   96 94 94 96  <  90% 1.3 0.2 0.3 1.8  < 80% 0.6 0.0 0.0 0.6  < 70% 0.6 0.0 0.0 0.6  # of Desaturations* 0 3 11 14   Desat Index (#/hour) 0.0 10.6 8.0 8.4  Desat Max (%) 0 9 9 9   Desat Max Dur (sec) 0.0 27.0 60.0 60.0  Approx Min O2 during sleep 86  Approx min O2 during a respiratory event 86  Was Oxygen added (Y/N) and final rate No:   0 LPM  *Desaturations based on 4% or greater drop from baseline.   Cheyne Stokes Breathing: None Present    Heart Rate Summary:  Average Heart Rate During Sleep 72.5 bpm      Highest Heart Rate During Sleep (95th %) 89.0 bpm      Highest Heart Rate During Sleep 167 bpm (artifact)  Highest Heart Rate During Recording (TIB) 240 bpm (artifact)   Heart Rate Observations: Event Type # Events   Bradycardia 0 Lowest HR Scored: N/A  Sinus Tachycardia During Sleep 0 Highest HR Scored: N/A  Narrow Complex Tachycardia 0 Highest HR Scored: N/A  Wide Complex Tachycardia 0 Highest HR Scored: N/A  Asystole 0 Longest Pause: N/A  Atrial Fibrillation 0 Duration Longest Event: N/A  Other Arrythmias  No Type:    Periodic Limb Movement Data: (Primary legs unless otherwise noted) Total # Limb Movement 19 Limb Movement Index 9.7  Total # PLMS 10 PLMS Index 5.1  Total # PLMS Arousals 1 PLMS Arousal Index 0.5  Percentage Sleep Time with PLMS 4.64min (3.7 % sleep)  Mean Duration limb movements (secs) 132.5

## 2020-08-14 ENCOUNTER — Encounter: Payer: Self-pay | Admitting: Hospice and Palliative Medicine

## 2020-08-14 ENCOUNTER — Other Ambulatory Visit: Payer: Self-pay

## 2020-08-14 ENCOUNTER — Ambulatory Visit: Payer: BC Managed Care – PPO | Admitting: Hospice and Palliative Medicine

## 2020-08-14 VITALS — BP 130/82 | HR 82 | Temp 98.6°F | Resp 16 | Wt 166.8 lb

## 2020-08-14 DIAGNOSIS — I10 Essential (primary) hypertension: Secondary | ICD-10-CM

## 2020-08-14 DIAGNOSIS — B20 Human immunodeficiency virus [HIV] disease: Secondary | ICD-10-CM

## 2020-08-14 DIAGNOSIS — Z122 Encounter for screening for malignant neoplasm of respiratory organs: Secondary | ICD-10-CM | POA: Diagnosis not present

## 2020-08-14 DIAGNOSIS — Z0001 Encounter for general adult medical examination with abnormal findings: Secondary | ICD-10-CM | POA: Diagnosis not present

## 2020-08-14 DIAGNOSIS — R300 Dysuria: Secondary | ICD-10-CM

## 2020-08-14 DIAGNOSIS — F17210 Nicotine dependence, cigarettes, uncomplicated: Secondary | ICD-10-CM | POA: Diagnosis not present

## 2020-08-14 DIAGNOSIS — E781 Pure hyperglyceridemia: Secondary | ICD-10-CM

## 2020-08-14 DIAGNOSIS — G4733 Obstructive sleep apnea (adult) (pediatric): Secondary | ICD-10-CM | POA: Diagnosis not present
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
## 2020-08-15 ENCOUNTER — Telehealth: Payer: Self-pay | Admitting: *Deleted

## 2020-08-15 DIAGNOSIS — Z87891 Personal history of nicotine dependence: Secondary | ICD-10-CM

## 2020-08-15 DIAGNOSIS — Z122 Encounter for screening for malignant neoplasm of respiratory organs: Secondary | ICD-10-CM

## 2020-08-15 DIAGNOSIS — F17200 Nicotine dependence, unspecified, uncomplicated: Secondary | ICD-10-CM

## 2020-08-15 LAB — UA/M W/RFLX CULTURE, ROUTINE
Bilirubin, UA: NEGATIVE
Glucose, UA: NEGATIVE
Ketones, UA: NEGATIVE
Leukocytes,UA: NEGATIVE
Nitrite, UA: NEGATIVE
Protein,UA: NEGATIVE
RBC, UA: NEGATIVE
Specific Gravity, UA: 1.016 (ref 1.005–1.030)
Urobilinogen, Ur: 0.2 mg/dL (ref 0.2–1.0)
pH, UA: 5.5 (ref 5.0–7.5)

## 2020-08-15 LAB — MICROSCOPIC EXAMINATION
Bacteria, UA: NONE SEEN
Casts: NONE SEEN /lpf
Epithelial Cells (non renal): NONE SEEN /hpf (ref 0–10)
WBC, UA: NONE SEEN /hpf (ref 0–5)

## 2020-08-15 NOTE — Telephone Encounter (Signed)
Received referral for initial lung cancer screening scan. Contacted patient and obtained smoking history,(current smoker, 1.5 ppd x 30 yrs ) as well as answering questions related to screening process. Patient denies signs of lung cancer such as weight loss or hemoptysis. Patient denies comorbidity that would prevent curative treatment if lung cancer were found. Patient is scheduled for shared decision making visit and CT scan on 09/12/20 @ 1:00pm.

## 2020-08-18 ENCOUNTER — Other Ambulatory Visit: Payer: Self-pay | Admitting: Physician Assistant

## 2020-08-21 ENCOUNTER — Encounter: Payer: Self-pay | Admitting: Hospice and Palliative Medicine

## 2020-09-11 DIAGNOSIS — J301 Allergic rhinitis due to pollen: Secondary | ICD-10-CM | POA: Diagnosis not present

## 2020-09-11 DIAGNOSIS — F17200 Nicotine dependence, unspecified, uncomplicated: Secondary | ICD-10-CM | POA: Diagnosis not present

## 2020-09-11 DIAGNOSIS — J342 Deviated nasal septum: Secondary | ICD-10-CM | POA: Diagnosis not present

## 2020-09-11 DIAGNOSIS — G4733 Obstructive sleep apnea (adult) (pediatric): Secondary | ICD-10-CM | POA: Diagnosis not present

## 2020-09-12 ENCOUNTER — Other Ambulatory Visit: Payer: Self-pay

## 2020-09-12 ENCOUNTER — Inpatient Hospital Stay: Payer: BC Managed Care – PPO | Attending: Hospice and Palliative Medicine | Admitting: Hospice and Palliative Medicine

## 2020-09-12 ENCOUNTER — Ambulatory Visit
Admission: RE | Admit: 2020-09-12 | Discharge: 2020-09-12 | Disposition: A | Discharge status: Home / Self Care | Payer: BC Managed Care – PPO | Source: Ambulatory Visit | Attending: Hospice and Palliative Medicine | Admitting: Hospice and Palliative Medicine

## 2020-09-12 DIAGNOSIS — F17210 Nicotine dependence, cigarettes, uncomplicated: Secondary | ICD-10-CM

## 2020-09-12 DIAGNOSIS — Z87891 Personal history of nicotine dependence: Secondary | ICD-10-CM | POA: Insufficient documentation

## 2020-09-12 DIAGNOSIS — F17200 Nicotine dependence, unspecified, uncomplicated: Secondary | ICD-10-CM | POA: Diagnosis not present

## 2020-09-12 DIAGNOSIS — Z122 Encounter for screening for malignant neoplasm of respiratory organs: Secondary | ICD-10-CM

## 2020-09-12 NOTE — Progress Notes (Signed)
Virtual Visit via Video Note  I connected with@ on 09/12/20 at@ by a video enabled telemedicine application and verified that I am speaking with the correct person using two identifiers.   I discussed the limitations of evaluation and management by telemedicine and the availability of in person appointments. The patient expressed understanding and agreed to proceed.  Location: Patient: [facility] Provider: Clinic   In accordance with CMS guidelines, patient has met eligibility criteria including age, absence of signs or symptoms of lung cancer.  Social History   Tobacco Use  . Smoking status: Current Every Day Smoker    Packs/day: 1.50    Years: 30.00    Pack years: 45.00    Types: Cigarettes  . Smokeless tobacco: Never Used  . Tobacco comment: since age 21  Vaping Use  . Vaping Use: Never used  Substance Use Topics  . Alcohol use: Yes    Alcohol/week: 21.0 standard drinks    Types: 21 Shots of liquor per week    Comment: weekends  . Drug use: Never      A shared decision-making session was conducted prior to the performance of CT scan. This includes one or more decision aids, includes benefits and harms of screening, follow-up diagnostic testing, over-diagnosis, false positive rate, and total radiation exposure.   Counseling on the importance of adherence to annual lung cancer LDCT screening, impact of co-morbidities, and ability or willingness to undergo diagnosis and treatment is imperative for compliance of the program.   Counseling on the importance of continued smoking cessation for former smokers; the importance of smoking cessation for current smokers, and information about tobacco cessation interventions have been given to patient including [place] and 1800 quit [place] programs.   Written order for lung cancer screening with LDCT has been given to the patient and any and all questions have been answered to the best of my abilities.    Yearly follow up will be  coordinated by Burgess Estelle, Thoracic Navigator.  Time Total: 15 minutes  Visit consisted of counseling and education dealing with complex health screening. Greater than 50%  of this time was spent counseling and coordinating care related to the above assessment and plan.  Signed by: Altha Harm, PhD, NP-C

## 2020-09-20 ENCOUNTER — Encounter: Payer: Self-pay | Admitting: *Deleted

## 2020-09-22 DIAGNOSIS — B20 Human immunodeficiency virus [HIV] disease: Secondary | ICD-10-CM | POA: Diagnosis not present

## 2020-10-03 ENCOUNTER — Other Ambulatory Visit: Payer: Self-pay | Admitting: Physician Assistant

## 2020-10-10 DIAGNOSIS — B20 Human immunodeficiency virus [HIV] disease: Secondary | ICD-10-CM | POA: Diagnosis not present

## 2020-10-26 ENCOUNTER — Other Ambulatory Visit: Payer: Self-pay | Admitting: Physician Assistant

## 2020-10-26 DIAGNOSIS — F411 Generalized anxiety disorder: Secondary | ICD-10-CM

## 2020-11-13 ENCOUNTER — Other Ambulatory Visit: Payer: Self-pay | Admitting: Internal Medicine

## 2020-11-13 NOTE — Telephone Encounter (Signed)
Pt has appt on 11/14/20

## 2020-11-14 ENCOUNTER — Encounter: Payer: Self-pay | Admitting: Nurse Practitioner

## 2020-11-14 ENCOUNTER — Other Ambulatory Visit: Payer: Self-pay

## 2020-11-14 ENCOUNTER — Ambulatory Visit: Payer: BC Managed Care – PPO | Admitting: Nurse Practitioner

## 2020-11-14 VITALS — BP 134/78 | HR 94 | Temp 97.8°F | Resp 16 | Ht 69.0 in | Wt 174.8 lb

## 2020-11-14 DIAGNOSIS — G4709 Other insomnia: Secondary | ICD-10-CM

## 2020-11-14 DIAGNOSIS — B20 Human immunodeficiency virus [HIV] disease: Secondary | ICD-10-CM

## 2020-11-14 DIAGNOSIS — F411 Generalized anxiety disorder: Secondary | ICD-10-CM | POA: Diagnosis not present

## 2020-11-14 DIAGNOSIS — G2581 Restless legs syndrome: Secondary | ICD-10-CM

## 2020-11-14 DIAGNOSIS — G4733 Obstructive sleep apnea (adult) (pediatric): Secondary | ICD-10-CM

## 2020-11-14 DIAGNOSIS — F17210 Nicotine dependence, cigarettes, uncomplicated: Secondary | ICD-10-CM

## 2020-11-14 DIAGNOSIS — E782 Mixed hyperlipidemia: Secondary | ICD-10-CM

## 2020-11-14 MED ORDER — QUVIVIQ 50 MG PO TABS: 50.0000 mg | ORAL_TABLET | Freq: Every day | ORAL | 2 refills | Status: DC

## 2020-11-14 MED ORDER — ROPINIROLE HCL 2 MG PO TABS: 2.0000 mg | ORAL_TABLET | Freq: Every day | ORAL | 2 refills | Status: DC

## 2020-11-14 NOTE — Progress Notes (Signed)
[facility] [place], [place] [address]  Internal MEDICINE  Office Visit Note  Patient Name: [name]  [id number]  [id number]  Date of Service: 11/14/2020  Chief Complaint  Patient presents with   Follow-up    SS study result, chest xray results, med review     HPI [name] presents for a follow up visit to review sleep study results, CT chest results and review medications. He has a history of COPD, OSA, hypertension, allergies, insomnia, restless legs, and HIV for which he takes [employer].  The sleep study was done on 07/19/20 and interpreted by Dr. Devona Konig. AHI was 10.7, and increased to 26.7 in supine position. A CPAP titration study is recommended for sleep apnea. Other alternative options include a nasal resistance device, ENT surgery or avoiding supine position during sleep.  -CT chest results showed mild emphysema with diffuse bronchial wall thickening, and tiny scattered solid pulmonary nodules. There is a simple 1.1 cm cyst in the posterior upper left kidney. Mild thoracic spondylosis and two vessel coronary atherosclerosis. Impression is Lung-RADS 2 benign appearance or behavior. Repeat CT chest in 12 months.  -having issues with insomnia and restless legs. Current medications are not effective, would like to try something different. -requesting lipid panel.   Current Medication: Outpatient Encounter Medications as of 11/14/2020  Medication Sig   albuterol (VENTOLIN HFA) 108 (90 Base) MCG/ACT inhaler INHALE 1-2 PUFFS INTO THE LUNGS EVERY 6 (SIX) HOURS AS NEEDED FOR WHEEZING OR SHORTNESS OF BREATH.   albuterol (VENTOLIN HFA) 108 (90 Base) MCG/ACT inhaler Inhale into the lungs.   bictegravir-emtricitabine-tenofovir AF (BIKTARVY) 50-200-25 MG TABS tablet Take 1 tablet by mouth daily.    cetirizine (ZYRTEC) 10 MG tablet Take 10 mg by mouth daily.   cyclobenzaprine (FLEXERIL) 10 MG tablet TAKE 1 TABLET BY MOUTH TWICE A DAY AS NEEDED FOR MUSCLE SPASMS    cyclobenzaprine (FLEXERIL) 10 MG tablet as needed.   doxepin (SINEQUAN) 25 MG capsule Take 25-50 mg by mouth at bedtime.   escitalopram (LEXAPRO) 20 MG tablet TAKE 1 TAB BY MOUTH DAILY WITH SUPPER (NEED APPT FOR REFILL)   fluticasone (FLONASE) 50 MCG/ACT nasal spray Place 1 spray into both nostrils daily.   lisinopril (ZESTRIL) 10 MG tablet Take 1 tablet (10 mg total) by mouth daily.   lisinopril (ZESTRIL) 10 MG tablet Take 1 tablet by mouth daily.   QUVIVIQ 50 MG TABS Take 50 mg by mouth at bedtime.   rOPINIRole (REQUIP) 2 MG tablet Take 1 tablet (2 mg total) by mouth at bedtime.   valACYclovir (VALTREX) 1000 MG tablet TAKE 1 TABLET BY MOUTH TWICE A DAY   valACYclovir (VALTREX) 1000 MG tablet Take by mouth.   [DISCONTINUED] hydrOXYzine (ATARAX/VISTARIL) 25 MG tablet TAKE 1 TABLET BY MOUTH TWICE A DAY   cetirizine (ZYRTEC) 10 MG tablet Take 1 tablet by mouth daily.   fluticasone (FLONASE) 50 MCG/ACT nasal spray Place into the nose.   mometasone-formoterol (DULERA) 200-5 MCG/ACT AERO Inhale into the lungs.   No facility-administered encounter medications on file as of 11/14/2020.    Surgical History: Past Surgical History:  Procedure Laterality Date   COLONOSCOPY WITH PROPOFOL N/A 03/22/2019   Procedure: COLONOSCOPY WITH BIOPSIES;  Surgeon: Lucilla Lame, MD;  Location: [place];  Service: Endoscopy;  Laterality: N/A;   I & D EXTREMITY Left 05/21/2019   Procedure: IRRIGATION AND DEBRIDEMENT LEFT LEG;  Surgeon: Herbert Pun, MD;  Location: [facility];  Service: General;  Laterality: Left;  I & D EXTREMITY Left 05/25/2019   Procedure: IRRIGATION AND DEBRIDEMENT Left Leg;  Surgeon: Herbert Pun, MD;  Location: [facility];  Service: General;  Laterality: Left;   POLYPECTOMY N/A 03/22/2019   Procedure: POLYPECTOMY;  Surgeon: Lucilla Lame, MD;  Location: [place];  Service: Endoscopy;  Laterality: N/A;    Medical History: Past Medical History:  Diagnosis Date    Allergy    COPD (chronic obstructive pulmonary disease) ([place])    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   HIV (human immunodeficiency virus infection) ([place])    Hypertension     Family History: Family History  Adopted: Yes    Social History   Socioeconomic History   Marital status: Divorced    Spouse name: Not on file   Number of children: Not on file   Years of education: Not on file   Highest education level: Not on file  Occupational History   Not on file  Tobacco Use   Smoking status: Every Day    Packs/day: 1.50    Years: 30.00    Pack years: 45.00    Types: Cigarettes   Smokeless tobacco: Never   Tobacco comments:    since age 65  Vaping Use   Vaping Use: Never used  Substance and Sexual Activity   Alcohol use: Yes    Alcohol/week: 21.0 standard drinks    Types: 21 Shots of liquor per week    Comment: weekends   Drug use: Never   Sexual activity: Not on file  Other Topics Concern   Not on file  Social History Narrative   Not on file   Social Determinants of Health   Financial Resource Strain: Not on file  Food Insecurity: Not on file  Transportation Needs: Not on file  Physical Activity: Not on file  Stress: Not on file  Social Connections: Not on file  Intimate Partner Violence: Not on file      Review of Systems  Constitutional:  Negative for chills, fatigue and unexpected weight change.  HENT:  Negative for congestion, rhinorrhea, sneezing and sore throat.   Eyes:  Negative for redness.  Respiratory:  Negative for cough, chest tightness and shortness of breath.   Cardiovascular:  Negative for chest pain and palpitations.  Gastrointestinal:  Negative for abdominal pain, constipation, diarrhea, nausea and vomiting.  Genitourinary:  Negative for dysuria and frequency.  Musculoskeletal:  Negative for arthralgias, back pain, joint swelling and neck pain.  Skin:  Negative for rash.  Neurological: Negative.  Negative for tremors and numbness.   Hematological:  Negative for adenopathy. Does not bruise/bleed easily.  Psychiatric/Behavioral:  Positive for sleep disturbance. Negative for behavioral problems (Depression), self-injury and suicidal ideas. The patient is nervous/anxious.    Vital Signs: BP 134/78   Pulse 94   Temp 97.8 F (36.6 C)   Resp 16   Ht '5\' 9"'$  (1.753 m)   Wt 174 lb 12.8 oz (79.3 kg)   SpO2 97%   BMI 25.81 kg/m    Physical Exam Vitals reviewed.  Constitutional:      General: He is not in acute distress.    Appearance: Normal appearance. He is normal weight. He is not ill-appearing.  HENT:     Head: Normocephalic and atraumatic.  Cardiovascular:     Rate and Rhythm: Normal rate and regular rhythm.  Pulmonary:     Effort: Pulmonary effort is normal. No respiratory distress.  Skin:    General: Skin is  warm and dry.     Capillary Refill: Capillary refill takes less than 2 seconds.  Neurological:     Mental Status: He is alert and oriented to person, place, and time.     Assessment/Plan: 1. OSA (obstructive sleep apnea) Discussed sleep study results, patient has significant sleep apnea that would benefit from CPAP, CPAP titration study ordered.  - Cpap titration; Future  2. Other insomnia Was put on doxepin for sleep by dermatologist per patient report, not helping; discussed various options of medications used for insomnia, patient was interested in [name], prescription sent to [facility] prescription services. Will follow up in 4 weeks to assess effectiveness in helping the patient sleep.  - QUVIVIQ 50 MG TABS; Take 50 mg by mouth at bedtime.  Dispense: 30 tablet; Refill: 2  3. Restless leg syndrome Sleep study showed quasi-periodic limb movements, patient also reports having issues with restless legs, ropinirole prescribed after discussing with patient. Will follow up to evaluate efficacy of new medication in 4 weeks. - rOPINIRole (REQUIP) 2 MG tablet; Take 1 tablet (2 mg total) by mouth at  bedtime.  Dispense: 30 tablet; Refill: 2  4. Generalized anxiety disorder [name] has a history of anxiety and is currently taking escitalopram which he reports is working well. Will continue to assess anxiety and effectiveness of current medication and dose periodically to determine if medication adjustment is needed.   5. Mixed hyperlipidemia No recent lipid panel, not currently on any medications for cholesterol levels. Patient is also requesting this lab, will discuss results at follow up visit.  - Lipid Profile  6. Cigarette nicotine dependence without complication [name] smokes 1.5 packs of cigarettes per day x30 years. He is a current every day smoker and has declined counseling for smoking cessation. He states he is not ready to quit. Briefly encouraged patient to reach out when he is ready to quit smoking and we can discuss smoking cessation and options to aid him in the process. He was open to this and stated he will reach out when ready.   7. HIV infection, unspecified symptom status ([place]) [name] is HIV positive, currently taking Biktarvy, this is managed by his specialist.    General Counseling: [name] understanding of the findings of todays visit and agrees with plan of treatment. I have discussed any further diagnostic evaluation that may be needed or ordered today. We also reviewed his medications today. he has been encouraged to call the office with any questions or concerns that should arise related to todays visit.    Orders Placed This Encounter  Procedures   Lipid Profile   Cpap titration    Meds ordered this encounter  Medications   QUVIVIQ 50 MG TABS    Sig: Take 50 mg by mouth at bedtime.    Dispense:  30 tablet    Refill:  2   rOPINIRole (REQUIP) 2 MG tablet    Sig: Take 1 tablet (2 mg total) by mouth at bedtime.    Dispense:  30 tablet    Refill:  2    Return in about 4 weeks (around 12/12/2020) for F/U, med refill, [name] PCP.   Total time spent:30  Minutes Time spent includes review of chart, medications, test results, and follow up plan with the patient.   [place] Controlled Substance Database was reviewed by me.  This patient was seen by Justin Osgood, FNP-C in collaboration with Dr. Clayborn Bigness as a part of collaborative care agreement.   Pacen Watford R. Valetta Fuller, MSN,  FNP-C Internal medicine

## 2020-12-08 ENCOUNTER — Other Ambulatory Visit: Payer: Self-pay | Admitting: Internal Medicine

## 2020-12-11 ENCOUNTER — Telehealth: Payer: Self-pay

## 2020-12-11 NOTE — Telephone Encounter (Signed)
APAP order has been sent to [facility] Patient.

## 2020-12-14 ENCOUNTER — Other Ambulatory Visit: Payer: Self-pay

## 2020-12-14 ENCOUNTER — Encounter: Payer: Self-pay | Admitting: Nurse Practitioner

## 2020-12-14 ENCOUNTER — Ambulatory Visit: Payer: BC Managed Care – PPO | Admitting: Nurse Practitioner

## 2020-12-14 VITALS — BP 128/86 | HR 90 | Temp 97.0°F | Resp 16 | Ht 69.5 in | Wt 169.4 lb

## 2020-12-14 DIAGNOSIS — F411 Generalized anxiety disorder: Secondary | ICD-10-CM

## 2020-12-14 DIAGNOSIS — G2581 Restless legs syndrome: Secondary | ICD-10-CM

## 2020-12-14 DIAGNOSIS — R2242 Localized swelling, mass and lump, left lower limb: Secondary | ICD-10-CM

## 2020-12-14 DIAGNOSIS — G4709 Other insomnia: Secondary | ICD-10-CM | POA: Diagnosis not present

## 2020-12-14 DIAGNOSIS — G4733 Obstructive sleep apnea (adult) (pediatric): Secondary | ICD-10-CM

## 2020-12-14 DIAGNOSIS — E782 Mixed hyperlipidemia: Secondary | ICD-10-CM

## 2020-12-14 MED ORDER — ROPINIROLE HCL 4 MG PO TABS: 4.0000 mg | ORAL_TABLET | Freq: Every day | ORAL | 1 refills | Status: DC

## 2020-12-14 NOTE — Progress Notes (Signed)
[facility] [place], [place] [address]  Internal MEDICINE  Office Visit Note  Patient Name: [name]  [id number]  [id number]  Date of Service: 12/14/2020  Chief Complaint  Patient presents with   Follow-up    med refill, upper left thigh has bumps inside on skin, feels like they're on tendons   Hypertension   COPD    HPI [name] presents for a follow up visit for medication refills. He also has nodules on the lateral upper left thigh under the skin. He states that they feel like they are on the tendons. [name] daily He is taking Quviviq daily at bedtime. He has been taking the ropinirole but may need an increase.  The nodule on the left lateral upper leg is just below the hip joint, palpable, nontender, not painful, no redness noted.    Current Medication: Outpatient Encounter Medications as of 12/14/2020  Medication Sig   albuterol (VENTOLIN HFA) 108 (90 Base) MCG/ACT inhaler INHALE 1-2 PUFFS INTO THE LUNGS EVERY 6 (SIX) HOURS AS NEEDED FOR WHEEZING OR SHORTNESS OF BREATH.   albuterol (VENTOLIN HFA) 108 (90 Base) MCG/ACT inhaler Inhale into the lungs.   bictegravir-emtricitabine-tenofovir AF (BIKTARVY) 50-200-25 MG TABS tablet Take 1 tablet by mouth daily.    cetirizine (ZYRTEC) 10 MG tablet Take 10 mg by mouth daily.   cyclobenzaprine (FLEXERIL) 10 MG tablet TAKE 1 TABLET BY MOUTH TWICE A DAY AS NEEDED FOR MUSCLE SPASM   escitalopram (LEXAPRO) 20 MG tablet TAKE 1 TAB BY MOUTH DAILY WITH SUPPER (NEED APPT FOR REFILL)   fluticasone (FLONASE) 50 MCG/ACT nasal spray Place 1 spray into both nostrils daily.   fluticasone (FLONASE) 50 MCG/ACT nasal spray Place into the nose.   lisinopril (ZESTRIL) 10 MG tablet Take 1 tablet (10 mg total) by mouth daily.   mometasone-formoterol (DULERA) 200-5 MCG/ACT AERO Inhale into the lungs.   QUVIVIQ 50 MG TABS Take 50 mg by mouth at bedtime.   [START ON 01/15/2021] rOPINIRole (REQUIP) 4 MG tablet Take 1 tablet (4 mg total)  by mouth at bedtime.   valACYclovir (VALTREX) 1000 MG tablet TAKE 1 TABLET BY MOUTH TWICE A DAY   [DISCONTINUED] rOPINIRole (REQUIP) 2 MG tablet Take 1 tablet (2 mg total) by mouth at bedtime.   No facility-administered encounter medications on file as of 12/14/2020.    Surgical History: Past Surgical History:  Procedure Laterality Date   COLONOSCOPY WITH PROPOFOL N/A 03/22/2019   Procedure: COLONOSCOPY WITH BIOPSIES;  Surgeon: Lucilla Lame, MD;  Location: [place];  Service: Endoscopy;  Laterality: N/A;   I & D EXTREMITY Left 05/21/2019   Procedure: IRRIGATION AND DEBRIDEMENT LEFT LEG;  Surgeon: Herbert Pun, MD;  Location: [facility];  Service: General;  Laterality: Left;   I & D EXTREMITY Left 05/25/2019   Procedure: IRRIGATION AND DEBRIDEMENT Left Leg;  Surgeon: Herbert Pun, MD;  Location: [facility];  Service: General;  Laterality: Left;   POLYPECTOMY N/A 03/22/2019   Procedure: POLYPECTOMY;  Surgeon: Lucilla Lame, MD;  Location: [place];  Service: Endoscopy;  Laterality: N/A;    Medical History: Past Medical History:  Diagnosis Date   Allergy    COPD (chronic obstructive pulmonary disease) ([place])    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   HIV (human immunodeficiency virus infection) ([place])    Hypertension     Family History: Family History  Adopted: Yes    Social History   Socioeconomic History   Marital  status: Divorced    Spouse name: Not on file   Number of children: Not on file   Years of education: Not on file   Highest education level: Not on file  Occupational History   Not on file  Tobacco Use   Smoking status: Every Day    Packs/day: 1.50    Years: 30.00    Pack years: 45.00    Types: Cigarettes   Smokeless tobacco: Never   Tobacco comments:    since age 36  Vaping Use   Vaping Use: Never used  Substance and Sexual Activity   Alcohol use: Yes    Alcohol/week: 21.0 standard drinks    Types: 21 Shots of liquor  per week    Comment: weekends   Drug use: Never   Sexual activity: Not on file  Other Topics Concern   Not on file  Social History Narrative   Not on file   Social Determinants of Health   Financial Resource Strain: Not on file  Food Insecurity: Not on file  Transportation Needs: Not on file  Physical Activity: Not on file  Stress: Not on file  Social Connections: Not on file  Intimate Partner Violence: Not on file      Review of Systems  Constitutional:  Negative for chills, fatigue and unexpected weight change.  HENT:  Negative for congestion, rhinorrhea, sneezing and sore throat.   Eyes:  Negative for redness.  Respiratory:  Negative for cough, chest tightness and shortness of breath.   Cardiovascular:  Negative for chest pain and palpitations.  Gastrointestinal:  Negative for abdominal pain, constipation, diarrhea, nausea and vomiting.  Genitourinary:  Negative for dysuria and frequency.  Musculoskeletal:  Negative for arthralgias, back pain, joint swelling and neck pain.  Skin:  Negative for rash.       Nodule under the skin of the left lateral upper leg.   Neurological: Negative.  Negative for tremors and numbness.  Hematological:  Negative for adenopathy. Does not bruise/bleed easily.  Psychiatric/Behavioral:  Negative for behavioral problems (Depression), sleep disturbance and suicidal ideas. The patient is not nervous/anxious.    Vital Signs: BP 128/86   Pulse 90   Temp (!) 97 F (36.1 C)   Resp 16   Ht 5' 9.5" (1.765 m)   Wt 169 lb 6.4 oz (76.8 kg)   SpO2 99%   BMI 24.66 kg/m    Physical Exam Vitals reviewed.  Constitutional:      General: He is not in acute distress.    Appearance: Normal appearance. He is normal weight. He is not ill-appearing.  HENT:     Head: Normocephalic and atraumatic.  Eyes:     Extraocular Movements: Extraocular movements intact.     Pupils: Pupils are equal, round, and reactive to light.  Cardiovascular:     Rate and  Rhythm: Normal rate and regular rhythm.  Pulmonary:     Effort: Pulmonary effort is normal. No respiratory distress.  Neurological:     Mental Status: He is alert and oriented to person, place, and time.     Cranial Nerves: No cranial nerve deficit.     Coordination: Coordination normal.     Gait: Gait normal.  Psychiatric:        Mood and Affect: Mood normal.        Behavior: Behavior normal.     Assessment/Plan: 1. Other insomnia [name] is helping, continue as prescribed.   2. Restless leg syndrome Increased the dose of ropinirole.  -  rOPINIRole (REQUIP) 4 MG tablet; Take 1 tablet (4 mg total) by mouth at bedtime.  Dispense: 90 tablet; Refill: 1  3. Subcutaneous nodule of left lower extremity Refer to general surgery -Ambulatory referral to General Surgery.   4. OSA (obstructive sleep apnea) Waiting to schedule CPAP titration.    General Counseling: Marylou Mccoy understanding of the findings of todays visit and agrees with plan of treatment. I have discussed any further diagnostic evaluation that may be needed or ordered today. We also reviewed his medications today. he has been encouraged to call the office with any questions or concerns that should arise related to todays visit.    No orders of the defined types were placed in this encounter.   Meds ordered this encounter  Medications   rOPINIRole (REQUIP) 4 MG tablet    Sig: Take 1 tablet (4 mg total) by mouth at bedtime.    Dispense:  90 tablet    Refill:  1    Return in about 1 month (around 01/14/2021) for F/U cpap titration results and med eval , Jahmire Ruffins PCP.   Total time spent: 30 Minutes Time spent includes review of chart, medications, test results, and follow up plan with the patient.   [place] Controlled Substance Database was reviewed by me.  This patient was seen by Jonetta Osgood, FNP-C in collaboration with Dr. Clayborn Bigness as a part of collaborative care agreement.   Amarya Kuehl R. Valetta Fuller, MSN,  FNP-C Internal medicine

## 2020-12-19 DIAGNOSIS — E782 Mixed hyperlipidemia: Secondary | ICD-10-CM | POA: Diagnosis not present

## 2020-12-20 LAB — LIPID PANEL
Chol/HDL Ratio: 4.2 ratio (ref 0.0–5.0)
Cholesterol, Total: 139 mg/dL (ref 100–199)
HDL: 33 mg/dL — ABNORMAL LOW (ref >39)
LDL Chol Calc (NIH): 39 mg/dL (ref 0–99)
Triglycerides: 467 mg/dL — ABNORMAL HIGH (ref 0–149)
VLDL Cholesterol Cal: 67 mg/dL — ABNORMAL HIGH (ref 5–40)

## 2020-12-28 DIAGNOSIS — G4733 Obstructive sleep apnea (adult) (pediatric): Secondary | ICD-10-CM | POA: Diagnosis not present

## 2021-01-05 ENCOUNTER — Telehealth: Payer: Self-pay

## 2021-01-05 NOTE — Telephone Encounter (Signed)
[place]ke with pt, he has picked up Quviviq from specialty pharmacy

## 2021-01-11 ENCOUNTER — Ambulatory Visit: Payer: BC Managed Care – PPO | Admitting: Nurse Practitioner

## 2021-01-11 DIAGNOSIS — Z0289 Encounter for other administrative examinations: Secondary | ICD-10-CM

## 2021-01-15 ENCOUNTER — Other Ambulatory Visit: Payer: Self-pay | Admitting: Nurse Practitioner

## 2021-01-15 MED ORDER — ALBUTEROL SULFATE HFA 108 (90 BASE) MCG/ACT IN AERS: 2.0000 | INHALATION_SPRAY | Freq: Four times a day (QID) | RESPIRATORY_TRACT | 2 refills | Status: DC | PRN

## 2021-01-19 ENCOUNTER — Telehealth: Payer: Self-pay

## 2021-01-19 NOTE — Telephone Encounter (Signed)
Patient schedule with [facility] General Surgery 01/23/21 @ 9:15-[name]

## 2021-01-21 ENCOUNTER — Other Ambulatory Visit: Payer: Self-pay | Admitting: Internal Medicine

## 2021-01-21 DIAGNOSIS — F411 Generalized anxiety disorder: Secondary | ICD-10-CM

## 2021-01-23 DIAGNOSIS — L989 Disorder of the skin and subcutaneous tissue, unspecified: Secondary | ICD-10-CM | POA: Diagnosis not present

## 2021-01-23 DIAGNOSIS — L84 Corns and callosities: Secondary | ICD-10-CM | POA: Diagnosis not present

## 2021-01-26 NOTE — Progress Notes (Signed)
Levels are still elevated but improving, will repeat lipid panel in 6 months.

## 2021-01-27 DIAGNOSIS — G4733 Obstructive sleep apnea (adult) (pediatric): Secondary | ICD-10-CM | POA: Diagnosis not present

## 2021-01-30 ENCOUNTER — Ambulatory Visit: Payer: BC Managed Care – PPO | Admitting: Nurse Practitioner

## 2021-01-30 ENCOUNTER — Encounter: Payer: Self-pay | Admitting: Nurse Practitioner

## 2021-01-30 ENCOUNTER — Other Ambulatory Visit: Payer: Self-pay

## 2021-01-30 VITALS — BP 130/90 | HR 94 | Temp 98.7°F | Resp 16 | Ht 69.0 in | Wt 169.4 lb

## 2021-01-30 DIAGNOSIS — G4733 Obstructive sleep apnea (adult) (pediatric): Secondary | ICD-10-CM

## 2021-01-30 DIAGNOSIS — G2581 Restless legs syndrome: Secondary | ICD-10-CM | POA: Diagnosis not present

## 2021-01-30 DIAGNOSIS — Z9989 Dependence on other enabling machines and devices: Secondary | ICD-10-CM

## 2021-01-30 DIAGNOSIS — G4709 Other insomnia: Secondary | ICD-10-CM | POA: Diagnosis not present

## 2021-01-30 MED ORDER — PREGABALIN 50 MG PO CAPS: 50.0000 mg | ORAL_CAPSULE | Freq: Every day | ORAL | 1 refills | Status: DC

## 2021-01-30 NOTE — Progress Notes (Signed)
[facility] [place], [place] [address]  Internal MEDICINE  Office Visit Note  Patient Name: [name]  [id number]  [id number]  Date of Service: 01/30/2021  Chief Complaint  Patient presents with   Follow-up   Hypertension   HIV Positive/AIDS   Quality Metric Gaps    Flu Shot    HPI [name] presents for a follow up visit. He has his CPAP set up at home. He continues to have restless leg and limb movements while he sleeps. He is also having problems with pulling off his CPAP mask while he is sleep. He seems to only be able to keep it on for a couple of hours at a time. He also reports putting up a video camera at the end of the bed and he has video footage of his legs still constantly moving throughout the night. He has also started to scratch again but not as bad as he was. He continues to take ropinirole and quviviq. He has tried gabapentin in the past and does not like that medication.     Current Medication: Outpatient Encounter Medications as of 01/30/2021  Medication Sig   albuterol (VENTOLIN HFA) 108 (90 Base) MCG/ACT inhaler INHALE 1-2 PUFFS INTO THE LUNGS EVERY 6 (SIX) HOURS AS NEEDED FOR WHEEZING OR SHORTNESS OF BREATH.   albuterol (VENTOLIN HFA) 108 (90 Base) MCG/ACT inhaler Inhale 2 puffs into the lungs every 6 (six) hours as needed for wheezing or shortness of breath.   bictegravir-emtricitabine-tenofovir AF (BIKTARVY) 50-200-25 MG TABS tablet Take 1 tablet by mouth daily.    cetirizine (ZYRTEC) 10 MG tablet Take 10 mg by mouth daily.   cyclobenzaprine (FLEXERIL) 10 MG tablet TAKE 1 TABLET BY MOUTH TWICE A DAY AS NEEDED FOR MUSCLE SPASM   escitalopram (LEXAPRO) 20 MG tablet TAKE 1 TAB BY MOUTH DAILY WITH SUPPER (NEED APPT FOR REFILL)   fluticasone (FLONASE) 50 MCG/ACT nasal spray Place 1 spray into both nostrils daily.   fluticasone (FLONASE) 50 MCG/ACT nasal spray Place into the nose.   lisinopril (ZESTRIL) 10 MG tablet Take 1 tablet (10 mg  total) by mouth daily.   mometasone-formoterol (DULERA) 200-5 MCG/ACT AERO Inhale into the lungs.   pregabalin (LYRICA) 50 MG capsule Take 1 capsule (50 mg total) by mouth at bedtime.   QUVIVIQ 50 MG TABS Take 50 mg by mouth at bedtime.   rOPINIRole (REQUIP) 4 MG tablet Take 1 tablet (4 mg total) by mouth at bedtime.   [DISCONTINUED] valACYclovir (VALTREX) 1000 MG tablet TAKE 1 TABLET BY MOUTH TWICE A DAY   No facility-administered encounter medications on file as of 01/30/2021.    Surgical History: Past Surgical History:  Procedure Laterality Date   COLONOSCOPY WITH PROPOFOL N/A 03/22/2019   Procedure: COLONOSCOPY WITH BIOPSIES;  Surgeon: Lucilla Lame, MD;  Location: [facility];  Service: Endoscopy;  Laterality: N/A;   I & D EXTREMITY Left 05/21/2019   Procedure: IRRIGATION AND DEBRIDEMENT LEFT LEG;  Surgeon: Herbert Pun, MD;  Location: [facility];  Service: General;  Laterality: Left;   I & D EXTREMITY Left 05/25/2019   Procedure: IRRIGATION AND DEBRIDEMENT Left Leg;  Surgeon: Herbert Pun, MD;  Location: [facility];  Service: General;  Laterality: Left;   POLYPECTOMY N/A 03/22/2019   Procedure: POLYPECTOMY;  Surgeon: Lucilla Lame, MD;  Location: [facility]  Service: Endoscopy;  Laterality: N/A;    Medical History: Past Medical History:  Diagnosis Date   Allergy    COPD (chronic obstructive  pulmonary disease) ([place])    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   HIV (human immunodeficiency virus infection) ([place])    Hypertension     Family History: Family History  Adopted: Yes    Social History   Socioeconomic History   Marital status: Divorced    Spouse name: Not on file   Number of children: Not on file   Years of education: Not on file   Highest education level: Not on file  Occupational History   Not on file  Tobacco Use   Smoking status: Every Day    Packs/day: 1.50    Years: 30.00    Pack years: 45.00    Types: Cigarettes    Smokeless tobacco: Never   Tobacco comments:    since age 63  Vaping Use   Vaping Use: Never used  Substance and Sexual Activity   Alcohol use: Yes    Alcohol/week: 21.0 standard drinks    Types: 21 Shots of liquor per week    Comment: weekends   Drug use: Never   Sexual activity: Not on file  Other Topics Concern   Not on file  Social History Narrative   Not on file   Social Determinants of Health   Financial Resource Strain: Not on file  Food Insecurity: Not on file  Transportation Needs: Not on file  Physical Activity: Not on file  Stress: Not on file  Social Connections: Not on file  Intimate Partner Violence: Not on file      Review of Systems  Constitutional:  Negative for chills, fatigue and unexpected weight change.  HENT:  Positive for congestion, postnasal drip and rhinorrhea. Negative for sneezing and sore throat.   Eyes:  Negative for redness.  Respiratory: Negative.  Negative for cough, chest tightness and shortness of breath.   Cardiovascular: Negative.  Negative for chest pain and palpitations.  Gastrointestinal: Negative.  Negative for abdominal pain, constipation, diarrhea, nausea and vomiting.  Genitourinary:  Negative for dysuria and frequency.  Musculoskeletal:  Negative for arthralgias, back pain, joint swelling and neck pain.  Skin:  Negative for rash.  Neurological: Negative.  Negative for tremors and numbness.  Hematological:  Negative for adenopathy. Does not bruise/bleed easily.  Psychiatric/Behavioral:  Positive for sleep disturbance. Negative for behavioral problems (Depression), self-injury and suicidal ideas. The patient is not nervous/anxious.    Vital Signs: BP 130/90 Comment: 141/94  Pulse 94   Temp 98.7 F (37.1 C)   Resp 16   Ht 5\' 9"  (1.753 m)   Wt 169 lb 6.4 oz (76.8 kg)   SpO2 95%   BMI 25.02 kg/m    Physical Exam Vitals reviewed.  Constitutional:      General: He is not in acute distress.    Appearance: Normal  appearance. He is normal weight. He is not ill-appearing.  HENT:     Head: Normocephalic and atraumatic.  Eyes:     Extraocular Movements: Extraocular movements intact.     Pupils: Pupils are equal, round, and reactive to light.  Cardiovascular:     Rate and Rhythm: Normal rate and regular rhythm.  Pulmonary:     Effort: Pulmonary effort is normal. No respiratory distress.  Neurological:     Mental Status: He is alert and oriented to person, place, and time.     Cranial Nerves: No cranial nerve deficit.     Coordination: Coordination normal.     Gait: Gait normal.  Psychiatric:  Mood and Affect: Mood normal.        Behavior: Behavior normal.       Assessment/Plan: 1. OSA on CPAP Using CPAP but pulls it off in his sleep and is not getting enough hours with it on. He has an upcoming appointment with Claiborne Billings for his CPAP   2. Restless leg syndrome Continue ropinirole. Trial lyrica 50 mg at bedtime. Follow up in 1 month - pregabalin (LYRICA) 50 MG capsule; Take 1 capsule (50 mg total) by mouth at bedtime.  Dispense: 30 capsule; Refill: 1  3. Other insomnia Continue quviviq as prescribed.    General Counseling: Marylou Mccoy understanding of the findings of todays visit and agrees with plan of treatment. I have discussed any further diagnostic evaluation that may be needed or ordered today. We also reviewed his medications today. he has been encouraged to call the office with any questions or concerns that should arise related to todays visit.    No orders of the defined types were placed in this encounter.   Meds ordered this encounter  Medications   pregabalin (LYRICA) 50 MG capsule    Sig: Take 1 capsule (50 mg total) by mouth at bedtime.    Dispense:  30 capsule    Refill:  1    Take 1to 3 hours before bedtime after dinner    Return in about 1 month (around 03/02/2021) for F/U, eval new med, [place] PCP.   Total time spent:20 Minutes Time spent includes  review of chart, medications, test results, and follow up plan with the patient.   [place] Controlled Substance Database was reviewed by me.  This patient was seen by Jonetta Osgood, FNP-C in collaboration with Dr. Clayborn Bigness as a part of collaborative care agreement.   Ulanda Tackett R. Valetta Fuller, MSN, FNP-C Internal medicine

## 2021-02-07 ENCOUNTER — Ambulatory Visit: Payer: BC Managed Care – PPO

## 2021-02-09 ENCOUNTER — Other Ambulatory Visit: Payer: Self-pay

## 2021-02-09 DIAGNOSIS — G4709 Other insomnia: Secondary | ICD-10-CM

## 2021-02-09 MED ORDER — QUVIVIQ 50 MG PO TABS
50.0000 mg | ORAL_TABLET | Freq: Every day | ORAL | 2 refills | Status: DC
Start: 2021-02-09 — End: 2021-03-06

## 2021-02-10 ENCOUNTER — Other Ambulatory Visit: Payer: Self-pay | Admitting: Internal Medicine

## 2021-02-14 ENCOUNTER — Other Ambulatory Visit: Payer: Self-pay | Admitting: Nurse Practitioner

## 2021-02-14 DIAGNOSIS — G2581 Restless legs syndrome: Secondary | ICD-10-CM

## 2021-02-26 DIAGNOSIS — M2041 Other hammer toe(s) (acquired), right foot: Secondary | ICD-10-CM | POA: Diagnosis not present

## 2021-02-26 DIAGNOSIS — M898X9 Other specified disorders of bone, unspecified site: Secondary | ICD-10-CM | POA: Diagnosis not present

## 2021-02-26 DIAGNOSIS — M2012 Hallux valgus (acquired), left foot: Secondary | ICD-10-CM | POA: Diagnosis not present

## 2021-02-26 DIAGNOSIS — M2011 Hallux valgus (acquired), right foot: Secondary | ICD-10-CM | POA: Diagnosis not present

## 2021-02-27 DIAGNOSIS — G4733 Obstructive sleep apnea (adult) (pediatric): Secondary | ICD-10-CM | POA: Diagnosis not present

## 2021-03-01 ENCOUNTER — Other Ambulatory Visit: Payer: Self-pay | Admitting: Nurse Practitioner

## 2021-03-01 ENCOUNTER — Other Ambulatory Visit: Payer: Self-pay | Admitting: Internal Medicine

## 2021-03-01 DIAGNOSIS — I10 Essential (primary) hypertension: Secondary | ICD-10-CM

## 2021-03-01 DIAGNOSIS — G2581 Restless legs syndrome: Secondary | ICD-10-CM

## 2021-03-01 NOTE — Telephone Encounter (Signed)
Refill if appropriate .

## 2021-03-01 NOTE — Telephone Encounter (Signed)
Med sent to pharmacy.

## 2021-03-06 ENCOUNTER — Ambulatory Visit: Payer: BC Managed Care – PPO | Admitting: Nurse Practitioner

## 2021-03-06 ENCOUNTER — Encounter: Payer: Self-pay | Admitting: Nurse Practitioner

## 2021-03-06 ENCOUNTER — Other Ambulatory Visit: Payer: Self-pay

## 2021-03-06 VITALS — BP 140/90 | HR 80 | Temp 98.4°F | Resp 16 | Ht 69.5 in | Wt 176.8 lb

## 2021-03-06 DIAGNOSIS — G4733 Obstructive sleep apnea (adult) (pediatric): Secondary | ICD-10-CM

## 2021-03-06 DIAGNOSIS — G2581 Restless legs syndrome: Secondary | ICD-10-CM | POA: Diagnosis not present

## 2021-03-06 DIAGNOSIS — G4709 Other insomnia: Secondary | ICD-10-CM

## 2021-03-06 DIAGNOSIS — Z9989 Dependence on other enabling machines and devices: Secondary | ICD-10-CM | POA: Diagnosis not present

## 2021-03-06 MED ORDER — PREGABALIN 50 MG PO CAPS: 50.0000 mg | ORAL_CAPSULE | Freq: Every evening | ORAL | 1 refills | Status: DC | PRN

## 2021-03-06 NOTE — Progress Notes (Signed)
[facility] [place], [place] [address]  Internal MEDICINE  Office Visit Note  Patient Name: [name]  [id number]  [id number]  Date of Service: 04/01/2021  Chief Complaint  Patient presents with   Follow-up    Discuss meds   Hypertension   Gastroesophageal Reflux    HPI [name] presents for a follow up visit for medication review He is getting used to his CPAP more and sleeping better with the pregabalin. He has stopped taking the Quviviq and is taking ropinirole and/or pregabalin. 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   His blood pressure is initially high but improved when rechecked, see vitals.     Current Medication: Outpatient Encounter Medications as of 03/06/2021  Medication Sig   albuterol (VENTOLIN HFA) 108 (90 Base) MCG/ACT inhaler INHALE 1-2 PUFFS INTO THE LUNGS EVERY 6 (SIX) HOURS AS NEEDED FOR WHEEZING OR SHORTNESS OF BREATH.   albuterol (VENTOLIN HFA) 108 (90 Base) MCG/ACT inhaler Inhale 2 puffs into the lungs every 6 (six) hours as needed for wheezing or shortness of breath.   bictegravir-emtricitabine-tenofovir AF (BIKTARVY) 50-200-25 MG TABS tablet Take 1 tablet by mouth daily.    cetirizine (ZYRTEC) 10 MG tablet Take 10 mg by mouth daily.   cyclobenzaprine (FLEXERIL) 10 MG tablet TAKE 1 TABLET BY MOUTH TWICE A DAY AS NEEDED FOR MUSCLE SPASMS   escitalopram (LEXAPRO) 20 MG tablet TAKE 1 TAB BY MOUTH DAILY WITH SUPPER (NEED APPT FOR REFILL)   fluticasone (FLONASE) 50 MCG/ACT nasal spray Place 1 spray into both nostrils daily.   fluticasone (FLONASE) 50 MCG/ACT nasal spray Place into the nose.   lisinopril (ZESTRIL) 10 MG tablet TAKE 1 TABLET BY MOUTH EVERY DAY   mometasone-formoterol (DULERA) 200-5 MCG/ACT AERO Inhale into the lungs.   rOPINIRole (REQUIP) 4 MG tablet Take 1 tablet (4 mg total) by mouth at bedtime.   [DISCONTINUED] pregabalin (LYRICA) 50 MG capsule Take 1 capsule (50 mg total) by mouth at bedtime.    [DISCONTINUED] QUVIVIQ 50 MG TABS Take 50 mg by mouth at bedtime.   [DISCONTINUED] rOPINIRole (REQUIP) 2 MG tablet TAKE 1 TABLET BY MOUTH AT BEDTIME.   pregabalin (LYRICA) 50 MG capsule Take 1 capsule (50 mg total) by mouth at bedtime as needed (restless legs/sleep). Take 1 to 3 hours before bedtime after dinner   No facility-administered encounter medications on file as of 03/06/2021.    Surgical History: Past Surgical History:  Procedure Laterality Date   COLONOSCOPY WITH PROPOFOL N/A 03/22/2019   Procedure: COLONOSCOPY WITH BIOPSIES;  Surgeon: Lucilla Lame, MD;  Location: [facility];  Service: Endoscopy;  Laterality: N/A;   I & D EXTREMITY Left 05/21/2019   Procedure: IRRIGATION AND DEBRIDEMENT LEFT LEG;  Surgeon: Herbert Pun, MD;  Location: [facility];  Service: General;  Laterality: Left;   I & D EXTREMITY Left 05/25/2019   Procedure: IRRIGATION AND DEBRIDEMENT Left Leg;  Surgeon: Herbert Pun, MD;  Location: [facility];  Service: General;  Laterality: Left;   POLYPECTOMY N/A 03/22/2019   Procedure: POLYPECTOMY;  Surgeon: Lucilla Lame, MD;  Location: [facility];  Service: Endoscopy;  Laterality: N/A;    Medical History: Past Medical History:  Diagnosis Date   Allergy    COPD (chronic obstructive pulmonary disease) ([place])    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   HIV (human immunodeficiency virus infection) ([place])    Hypertension     Family History: Family History  Adopted: Yes    Social History   Socioeconomic History   Marital status: Divorced    Spouse name: Not on file   Number of children: Not on file   Years of education: Not on file   Highest education level: Not on file  Occupational History   Not on file  Tobacco Use   Smoking status: Every Day    Packs/day: 1.50    Years: 30.00    Pack years: 45.00    Types: Cigarettes   Smokeless tobacco: Never   Tobacco comments:    since age 11  Vaping Use   Vaping Use: Never used   Substance and Sexual Activity   Alcohol use: Yes    Alcohol/week: 21.0 standard drinks    Types: 21 Shots of liquor per week    Comment: weekends   Drug use: Never   Sexual activity: Not on file  Other Topics Concern   Not on file  Social History Narrative   Not on file   Social Determinants of Health   Financial Resource Strain: Not on file  Food Insecurity: Not on file  Transportation Needs: Not on file  Physical Activity: Not on file  Stress: Not on file  Social Connections: Not on file  Intimate Partner Violence: Not on file      Review of Systems  Constitutional:  Negative for chills, fatigue and unexpected weight change.  HENT:  Negative for congestion, rhinorrhea, sneezing and sore throat.   Eyes:  Negative for redness.  Respiratory:  Negative for cough, chest tightness and shortness of breath.   Cardiovascular:  Negative for chest pain and palpitations.  Gastrointestinal:  Negative for abdominal pain, constipation, diarrhea, nausea and vomiting.  Genitourinary:  Negative for dysuria and frequency.  Musculoskeletal:  Negative for arthralgias, back pain, joint swelling and neck pain.  Skin:  Negative for rash.  Neurological: Negative.  Negative for tremors and numbness.  Hematological:  Negative for adenopathy. Does not bruise/bleed easily.  Psychiatric/Behavioral:  Negative for behavioral problems (Depression), sleep disturbance and suicidal ideas. The patient is not nervous/anxious.    Vital Signs: BP 140/90 Comment: 151/104  Pulse 80   Temp 98.4 F (36.9 C)   Resp 16   Ht 5' 9.5" (1.765 m)   Wt 176 lb 12.8 oz (80.2 kg)   SpO2 99%   BMI 25.73 kg/m    Physical Exam Vitals reviewed.  Constitutional:      General: He is not in acute distress.    Appearance: Normal appearance. He is normal weight. He is not ill-appearing.  HENT:     Head: Normocephalic and atraumatic.  Eyes:     Pupils: Pupils are equal, round, and reactive to light.  Cardiovascular:      Rate and Rhythm: Normal rate and regular rhythm.  Pulmonary:     Effort: Pulmonary effort is normal. No respiratory distress.  Neurological:     Mental Status: He is alert and oriented to person, place, and time.     Cranial Nerves: No cranial nerve deficit.     Coordination: Coordination normal.     Gait: Gait normal.  Psychiatric:        Mood and Affect: Mood normal.        Behavior: Behavior normal.       Assessment/Plan: 1. Restless leg syndrome Improved with pregabalin. Still taking ropinirole. Previously discussed keeping track of his T-cell count with his ID specialist. The only contraindication of concern is the increased risk of  infection with a low T-cell count but he reports his T-cell count is high. He affirms that he will not take the pregabalin if his T-cell count is low.  - pregabalin (LYRICA) 50 MG capsule; Take 1 capsule (50 mg total) by mouth at bedtime as needed (restless legs/sleep). Take 1 to 3 hours before bedtime after dinner  Dispense: 30 capsule; Refill: 1  2. OSA on CPAP He is tolerating the mask better. He is considering talking to [place] about getting the nasal cannula only mask. But for now he is keeping it on better and not ripping it off in his sleep  3. Other insomnia Improved with control of restless leg. He takes ropinirole at night for restless legs but takes pregabalin when he is having a harder time falling asleep. He stopped taking quviviq   General Counseling: Marylou Mccoy understanding of the findings of todays visit and agrees with plan of treatment. I have discussed any further diagnostic evaluation that may be needed or ordered today. We also reviewed his medications today. he has been encouraged to call the office with any questions or concerns that should arise related to todays visit.    No orders of the defined types were placed in this encounter.   Meds ordered this encounter  Medications   pregabalin (LYRICA) 50 MG capsule     Sig: Take 1 capsule (50 mg total) by mouth at bedtime as needed (restless legs/sleep). Take 1 to 3 hours before bedtime after dinner    Dispense:  30 capsule    Refill:  1    Return in about 3 months (around 06/06/2021) for F/U, med refill, [place] PCP.   Total time spent:30 Minutes Time spent includes review of chart, medications, test results, and follow up plan with the patient.   [place] Controlled Substance Database was reviewed by me.  This patient was seen by Jonetta Osgood, FNP-C in collaboration with Dr. Clayborn Bigness as a part of collaborative care agreement.   Jontay Maston R. Valetta Fuller, MSN, FNP-C Internal medicine

## 2021-03-07 ENCOUNTER — Ambulatory Visit: Payer: BC Managed Care – PPO

## 2021-03-07 DIAGNOSIS — G4733 Obstructive sleep apnea (adult) (pediatric): Secondary | ICD-10-CM | POA: Diagnosis not present

## 2021-03-07 NOTE — Progress Notes (Signed)
95 percentile pressure 10.5   95th percentile leak 20   apnea-hypopnea index  2.0 /hr   total days used  >4 hr 16 days  total days used <4 hr 25 days  Total compliance 64 percent  Spoke to him about compliance will read cpap again in one month Pt was seen by Claiborne Billings from [facility]

## 2021-03-12 DIAGNOSIS — B20 Human immunodeficiency virus [HIV] disease: Secondary | ICD-10-CM | POA: Diagnosis not present

## 2021-03-19 ENCOUNTER — Ambulatory Visit: Payer: BC Managed Care – PPO | Admitting: Internal Medicine

## 2021-03-29 DIAGNOSIS — G4733 Obstructive sleep apnea (adult) (pediatric): Secondary | ICD-10-CM | POA: Diagnosis not present

## 2021-04-01 ENCOUNTER — Encounter: Payer: Self-pay | Admitting: Nurse Practitioner

## 2021-04-03 ENCOUNTER — Ambulatory Visit: Payer: BC Managed Care – PPO | Admitting: Nurse Practitioner

## 2021-04-03 ENCOUNTER — Other Ambulatory Visit: Payer: Self-pay

## 2021-04-03 ENCOUNTER — Encounter: Payer: Self-pay | Admitting: Nurse Practitioner

## 2021-04-03 VITALS — BP 120/86 | HR 95 | Temp 98.5°F | Resp 16 | Ht 69.5 in | Wt 175.8 lb

## 2021-04-03 DIAGNOSIS — Z9989 Dependence on other enabling machines and devices: Secondary | ICD-10-CM | POA: Diagnosis not present

## 2021-04-03 DIAGNOSIS — G2581 Restless legs syndrome: Secondary | ICD-10-CM

## 2021-04-03 DIAGNOSIS — Z7189 Other specified counseling: Secondary | ICD-10-CM | POA: Diagnosis not present

## 2021-04-03 DIAGNOSIS — G4733 Obstructive sleep apnea (adult) (pediatric): Secondary | ICD-10-CM | POA: Diagnosis not present

## 2021-04-03 MED ORDER — PRAMIPEXOLE DIHYDROCHLORIDE 0.125 MG PO TABS: 0.1250 mg | ORAL_TABLET | Freq: Every day | ORAL | 0 refills | Status: DC

## 2021-04-03 NOTE — Progress Notes (Signed)
[name], [place] [address]  Internal MEDICINE  Office Visit Note  Patient Name: [name]  [id number]  [id number]  Date of Service: 04/03/2021  Chief Complaint  Patient presents with   Follow-up    cpap   COPD   Sleep Apnea    HPI [name] presents for follow-up visit for sleep apnea and CPAP use.  He reports that he has had more episodes during his sleep where he has yanked the machine off of his face.  He reports that the last time he pulled the machine off of his face it somehow flipped over the machine and now it does not work anymore.  He has called [name] to notify them of this problem but has not been able to reach anyone on the phone.  He continues to report having difficulty sleeping due to severe restless leg and periodic limb movements.  The ropinirole has not been adequate in controlling his symptoms and the pregabalin makes him too sleepy and he is worried about the possible side effects of pregabalin.  He is interested in trying a different medication to help control the restless legs and periodic limb movements.  His total compliance over the last 40 days was 64%.  He is scheduled to see [name] again this month.  His CPAP download information can be seen below:  CPAP download 95 percentile pressure 10.5 95th percentile leak 20 apnea-hypopnea index  2.0 /hr total days used  >4 hr 16 days total days used <4 hr 25 days  Total compliance 64 percent Spoke to him about compliance will read cpap again in one month Pt was seen by [name] from [name]     Current Medication: Outpatient Encounter Medications as of 04/03/2021  Medication Sig   albuterol (VENTOLIN HFA) 108 (90 Base) MCG/ACT inhaler INHALE 1-2 PUFFS INTO THE LUNGS EVERY 6 (SIX) HOURS AS NEEDED FOR WHEEZING OR SHORTNESS OF BREATH.   albuterol (VENTOLIN HFA) 108 (90 Base) MCG/ACT inhaler Inhale 2 puffs into the lungs every 6 (six) hours as needed for wheezing or  shortness of breath.   bictegravir-emtricitabine-tenofovir AF (BIKTARVY) 50-200-25 MG TABS tablet Take 1 tablet by mouth daily.    cetirizine (ZYRTEC) 10 MG tablet Take 10 mg by mouth daily.   cyclobenzaprine (FLEXERIL) 10 MG tablet TAKE 1 TABLET BY MOUTH TWICE A DAY AS NEEDED FOR MUSCLE SPASMS   escitalopram (LEXAPRO) 20 MG tablet TAKE 1 TAB BY MOUTH DAILY WITH SUPPER (NEED APPT FOR REFILL)   fluticasone (FLONASE) 50 MCG/ACT nasal spray Place 1 spray into both nostrils daily.   fluticasone (FLONASE) 50 MCG/ACT nasal spray Place into the nose.   lisinopril (ZESTRIL) 10 MG tablet TAKE 1 TABLET BY MOUTH EVERY DAY   mometasone-formoterol (DULERA) 200-5 MCG/ACT AERO Inhale into the lungs.   pramipexole (MIRAPEX) 0.125 MG tablet Take 1 tablet (0.125 mg total) by mouth at bedtime. Take 2-3 hours prior to going to bed. Start with 1 tablet days 1-3, may increase to 2 tablets days 4-7, if needed then may increase to 4 tablets.   pregabalin (LYRICA) 50 MG capsule Take 1 capsule (50 mg total) by mouth at bedtime as needed (restless legs/sleep). Take 1 to 3 hours before bedtime after dinner   valACYclovir (VALTREX) 1000 MG tablet Take 1,000 mg by mouth 2 (two) times daily.   [DISCONTINUED] rOPINIRole (REQUIP) 4 MG tablet Take 1 tablet (4 mg total) by mouth at bedtime.   No facility-administered encounter medications on  file as of 04/03/2021.    Surgical History: Past Surgical History:  Procedure Laterality Date   COLONOSCOPY WITH PROPOFOL N/A 03/22/2019   Procedure: COLONOSCOPY WITH BIOPSIES;  Surgeon: Justin Lame, MD;  Location: [facility];  Service: Endoscopy;  Laterality: N/A;   I & D EXTREMITY Left 05/21/2019   Procedure: IRRIGATION AND DEBRIDEMENT LEFT LEG;  Surgeon: Justin Pun, MD;  [facility]: [name];  Service: General;  Laterality: Left;   I & D EXTREMITY Left 05/25/2019   Procedure: IRRIGATION AND DEBRIDEMENT Left Leg;  Surgeon: Justin Pun, MD;  Location: [name];   Service: General;  Laterality: Left;   POLYPECTOMY N/A 03/22/2019   Procedure: POLYPECTOMY;  Surgeon: Justin Lame, MD;  Location: [name];  Service: Endoscopy;  Laterality: N/A;    Medical History: Past Medical History:  Diagnosis Date   Allergy    COPD (chronic obstructive pulmonary disease) ([place])    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   HIV (human immunodeficiency virus infection) ([place])    Hypertension    Sleep apnea     Family History: Family History  Adopted: Yes    Social History   Socioeconomic History   Marital status: Divorced    Spouse name: Not on file   Number of children: Not on file   Years of education: Not on file   Highest education level: Not on file  Occupational History   Not on file  Tobacco Use   Smoking status: Every Day    Packs/day: 1.50    Years: 30.00    Pack years: 45.00    Types: Cigarettes   Smokeless tobacco: Never   Tobacco comments:    since age 35  Vaping Use   Vaping Use: Never used  Substance and Sexual Activity   Alcohol use: Yes    Alcohol/week: 21.0 standard drinks    Types: 21 Shots of liquor per week    Comment: weekends   Drug use: Never   Sexual activity: Not on file  Other Topics Concern   Not on file  Social History Narrative   Not on file   Social Determinants of Health   Financial Resource Strain: Not on file  Food Insecurity: Not on file  Transportation Needs: Not on file  Physical Activity: Not on file  Stress: Not on file  Social Connections: Not on file  Intimate Partner Violence: Not on file      Review of Systems  Constitutional:  Negative for chills, fatigue and unexpected weight change.  HENT:  Negative for congestion, rhinorrhea, sneezing and sore throat.   Eyes:  Negative for redness.  Respiratory:  Negative for cough, chest tightness and shortness of breath.   Cardiovascular:  Negative for chest pain and palpitations.  Gastrointestinal:  Negative for abdominal pain,  constipation, diarrhea, nausea and vomiting.  Genitourinary:  Negative for dysuria and frequency.  Musculoskeletal:  Negative for arthralgias, back pain, joint swelling and neck pain.  Skin:  Negative for rash.  Neurological: Negative.  Negative for tremors and numbness.  Hematological:  Negative for adenopathy. Does not bruise/bleed easily.  Psychiatric/Behavioral:  Negative for behavioral problems (Depression), sleep disturbance and suicidal ideas. The patient is not nervous/anxious.    Vital Signs: BP 120/86   Pulse 95   Temp 98.5 F (36.9 C)   Resp 16   Ht 5' 9.5" (1.765 m)   Wt 175 lb 12.8 oz (79.7 kg)   SpO2 99%   BMI 25.59 kg/m  Physical Exam Vitals reviewed.  Constitutional:      General: He is not in acute distress.    Appearance: Normal appearance. He is normal weight. He is not ill-appearing.  HENT:     Head: Normocephalic and atraumatic.  Eyes:     Pupils: Pupils are equal, round, and reactive to light.  Cardiovascular:     Rate and Rhythm: Normal rate and regular rhythm.  Pulmonary:     Effort: Pulmonary effort is normal. No respiratory distress.  Neurological:     Mental Status: He is alert and oriented to person, place, and time.     Cranial Nerves: No cranial nerve deficit.     Coordination: Coordination normal.     Gait: Gait normal.  Psychiatric:        Mood and Affect: Mood normal.        Behavior: Behavior normal.       Assessment/Plan: 1. OSA on CPAP Compliance percentage is poor. Patient has significant restless leg and periodic limb movements when he is sleeping and often pulled the machine off in his sleep. He is supposed to see [name] again this month.   2. Restless leg syndrome Ropinirole discontinued, will try pramipexole and follow up in 1 month.  - pramipexole (MIRAPEX) 0.125 MG tablet; Take 1 tablet (0.125 mg total) by mouth at bedtime. Take 2-3 hours prior to going to bed. Start with 1 tablet days 1-3, may increase to 2 tablets days  4-7, if needed then may increase to 4 tablets.  Dispense: 120 tablet; Refill: 0  3. CPAP use counseling Discussed compliance further and ways to help him stay asleep and keep his mask on. He has been trying to reach [place] because the last time he pulled it off the machine flipped over and now it will not work.    General Counseling: Marylou Mccoy understanding of the findings of todays visit and agrees with plan of treatment. I have discussed any further diagnostic evaluation that may be needed or ordered today. We also reviewed his medications today. he has been encouraged to call the office with any questions or concerns that should arise related to todays visit.    No orders of the defined types were placed in this encounter.   Meds ordered this encounter  Medications   pramipexole (MIRAPEX) 0.125 MG tablet    Sig: Take 1 tablet (0.125 mg total) by mouth at bedtime. Take 2-3 hours prior to going to bed. Start with 1 tablet days 1-3, may increase to 2 tablets days 4-7, if needed then may increase to 4 tablets.    Dispense:  120 tablet    Refill:  0    New med, discontinue ropinirole    Return in about 1 month (around 05/04/2021) for F/U, pulmonary/sleep, Burnice Vassel PCP.   Total time spent:30 Minutes Time spent includes review of chart, medications, test results, and follow up plan with the patient.   [place] Controlled Substance Database was reviewed by me.  This patient was seen by Jonetta Osgood, FNP-C in collaboration with Dr. Clayborn Bigness as a part of collaborative care agreement.   Matej Sappenfield R. Valetta Fuller, MSN, FNP-C Internal medicine

## 2021-04-04 ENCOUNTER — Encounter: Payer: Self-pay | Admitting: Nurse Practitioner

## 2021-04-05 ENCOUNTER — Ambulatory Visit: Payer: BC Managed Care – PPO | Admitting: Nurse Practitioner

## 2021-04-09 ENCOUNTER — Other Ambulatory Visit: Payer: Self-pay | Admitting: Nurse Practitioner

## 2021-04-10 DIAGNOSIS — Z6825 Body mass index (BMI) 25.0-25.9, adult: Secondary | ICD-10-CM | POA: Diagnosis not present

## 2021-04-10 DIAGNOSIS — B20 Human immunodeficiency virus [HIV] disease: Secondary | ICD-10-CM | POA: Diagnosis not present

## 2021-04-10 DIAGNOSIS — B002 Herpesviral gingivostomatitis and pharyngotonsillitis: Secondary | ICD-10-CM | POA: Diagnosis not present

## 2021-04-10 DIAGNOSIS — E781 Pure hyperglyceridemia: Secondary | ICD-10-CM | POA: Diagnosis not present

## 2021-04-13 ENCOUNTER — Other Ambulatory Visit: Payer: Self-pay | Admitting: Nurse Practitioner

## 2021-04-13 DIAGNOSIS — G2581 Restless legs syndrome: Secondary | ICD-10-CM

## 2021-04-14 NOTE — Telephone Encounter (Signed)
Refill sent to pharmacy.   

## 2021-04-26 ENCOUNTER — Other Ambulatory Visit: Payer: Self-pay | Admitting: Nurse Practitioner

## 2021-04-26 DIAGNOSIS — G2581 Restless legs syndrome: Secondary | ICD-10-CM

## 2021-04-27 NOTE — Telephone Encounter (Signed)
Dose updated to 1 0.5 mg tablet daily at bedtime. Please call patient and make sure he understands to take 1 tablet at bedtime of the new dose instead of 4 tablets of the prior dose

## 2021-04-28 NOTE — Telephone Encounter (Signed)
LMOM  for pt to only take one tablet at bedtime that the dose has changed.  Advised to call us back if any questions

## 2021-04-29 DIAGNOSIS — G4733 Obstructive sleep apnea (adult) (pediatric): Secondary | ICD-10-CM | POA: Diagnosis not present

## 2021-04-30 DIAGNOSIS — B20 Human immunodeficiency virus [HIV] disease: Secondary | ICD-10-CM | POA: Diagnosis not present

## 2021-05-01 ENCOUNTER — Ambulatory Visit: Payer: BC Managed Care – PPO | Admitting: Nurse Practitioner

## 2021-05-01 ENCOUNTER — Other Ambulatory Visit: Payer: Self-pay

## 2021-05-01 ENCOUNTER — Encounter: Payer: Self-pay | Admitting: Nurse Practitioner

## 2021-05-01 VITALS — BP 140/90 | HR 82 | Temp 98.2°F | Resp 16 | Ht 69.5 in | Wt 176.2 lb

## 2021-05-01 DIAGNOSIS — G2581 Restless legs syndrome: Secondary | ICD-10-CM | POA: Diagnosis not present

## 2021-05-01 DIAGNOSIS — B20 Human immunodeficiency virus [HIV] disease: Secondary | ICD-10-CM

## 2021-05-01 DIAGNOSIS — G4709 Other insomnia: Secondary | ICD-10-CM | POA: Diagnosis not present

## 2021-05-01 DIAGNOSIS — G4733 Obstructive sleep apnea (adult) (pediatric): Secondary | ICD-10-CM

## 2021-05-01 MED ORDER — PREGABALIN 50 MG PO CAPS: 50.0000 mg | ORAL_CAPSULE | Freq: Every day | ORAL | 0 refills | Status: DC

## 2021-05-01 NOTE — Progress Notes (Signed)
[facility], [place] [address]  Internal MEDICINE  Office Visit Note  Patient Name: [name]  [id number]  [id number]  Date of Service: 05/01/2021  Chief Complaint  Patient presents with   Follow-up    Stopped using cpap   Sleep Apnea    HPI [name] presents for a follow up visit for trouble sleeping and restless legs. He was taking ropinirole and was changed to pramipexole at his previous office visit which has been helping much better than Pregabalin and pramipexole is helping, sleeping through night, stopped with  Started injectable therapy for HIV to keep his levels undetectable.   Current Medication: Outpatient Encounter Medications as of 05/01/2021  Medication Sig   albuterol (VENTOLIN HFA) 108 (90 Base) MCG/ACT inhaler Inhale 2 puffs into the lungs every 6 (six) hours as needed for wheezing or shortness of breath.   bictegravir-emtricitabine-tenofovir AF (BIKTARVY) 50-200-25 MG TABS tablet Take 1 tablet by mouth daily.    cetirizine (ZYRTEC) 10 MG tablet Take 10 mg by mouth daily.   escitalopram (LEXAPRO) 20 MG tablet TAKE 1 TAB BY MOUTH DAILY WITH SUPPER (NEED APPT FOR REFILL)   fluticasone (FLONASE) 50 MCG/ACT nasal spray Place 1 spray into both nostrils daily.   lisinopril (ZESTRIL) 10 MG tablet TAKE 1 TABLET BY MOUTH EVERY DAY   mometasone-formoterol (DULERA) 200-5 MCG/ACT AERO Inhale into the lungs.   pramipexole (MIRAPEX) 0.5 MG tablet Take 1 tablet (0.5 mg total) by mouth at bedtime. Take 2-3 hours prior to going to bed.   valACYclovir (VALTREX) 1000 MG tablet Take 1,000 mg by mouth 2 (two) times daily.   [DISCONTINUED] cyclobenzaprine (FLEXERIL) 10 MG tablet TAKE 1 TABLET BY MOUTH TWICE A DAY AS NEEDED FOR MUSCLE SPASM   [DISCONTINUED] pregabalin (LYRICA) 50 MG capsule TAKE 1 CAPSULE BY MOUTH 1-3 HOURS BEFORE BEDTIME AFTER DINNER   pregabalin (LYRICA) 50 MG capsule Take 1 capsule (50 mg total) by mouth daily.   [DISCONTINUED] albuterol  (VENTOLIN HFA) 108 (90 Base) MCG/ACT inhaler INHALE 1-2 PUFFS INTO THE LUNGS EVERY 6 (SIX) HOURS AS NEEDED FOR WHEEZING OR SHORTNESS OF BREATH. (Patient not taking: Reported on 05/01/2021)   [DISCONTINUED] fluticasone (FLONASE) 50 MCG/ACT nasal spray Place into the nose. (Patient not taking: Reported on 05/01/2021)   No facility-administered encounter medications on file as of 05/01/2021.    Surgical History: Past Surgical History:  Procedure Laterality Date   COLONOSCOPY WITH PROPOFOL N/A 03/22/2019   Procedure: COLONOSCOPY WITH BIOPSIES;  Surgeon: Lucilla Lame, MD;  Location: [facility];  Service: Endoscopy;  Laterality: N/A;   I & D EXTREMITY Left 05/21/2019   Procedure: IRRIGATION AND DEBRIDEMENT LEFT LEG;  Surgeon: Herbert Pun, MD;  Location: [facility];  Service: General;  Laterality: Left;   I & D EXTREMITY Left 05/25/2019   Procedure: IRRIGATION AND DEBRIDEMENT Left Leg;  Surgeon: Herbert Pun, MD;  Location: [facility];  Service: General;  Laterality: Left;   POLYPECTOMY N/A 03/22/2019   Procedure: POLYPECTOMY;  Surgeon: Lucilla Lame, MD;  Location: [facility];  Service: Endoscopy;  Laterality: N/A;    Medical History: Past Medical History:  Diagnosis Date   Allergy    COPD (chronic obstructive pulmonary disease) ([place])    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   HIV (human immunodeficiency virus infection) ([place])    Hypertension    Sleep apnea     Family History: Family History  Adopted: Yes    Social History  Socioeconomic History   Marital status: Divorced    Spouse name: Not on file   Number of children: Not on file   Years of education: Not on file   Highest education level: Not on file  Occupational History   Not on file  Tobacco Use   Smoking status: Every Day    Packs/day: 1.50    Years: 30.00    Pack years: 45.00    Types: Cigarettes   Smokeless tobacco: Never   Tobacco comments:    since age 5  Vaping Use   Vaping  Use: Never used  Substance and Sexual Activity   Alcohol use: Yes    Alcohol/week: 21.0 standard drinks    Types: 21 Shots of liquor per week    Comment: weekends   Drug use: Never   Sexual activity: Not on file  Other Topics Concern   Not on file  Social History Narrative   Not on file   Social Determinants of Health   Financial Resource Strain: Not on file  Food Insecurity: Not on file  Transportation Needs: Not on file  Physical Activity: Not on file  Stress: Not on file  Social Connections: Not on file  Intimate Partner Violence: Not on file      Review of Systems  Constitutional:  Negative for chills, fatigue and unexpected weight change.  HENT:  Negative for congestion, rhinorrhea, sneezing and sore throat.   Eyes:  Negative for redness.  Respiratory:  Negative for cough, chest tightness and shortness of breath.   Cardiovascular:  Negative for chest pain and palpitations.  Gastrointestinal:  Negative for abdominal pain, constipation, diarrhea, nausea and vomiting.  Genitourinary:  Negative for dysuria and frequency.  Musculoskeletal:  Negative for arthralgias, back pain, joint swelling and neck pain.  Skin:  Negative for rash.  Neurological: Negative.  Negative for tremors and numbness.  Hematological:  Negative for adenopathy. Does not bruise/bleed easily.  Psychiatric/Behavioral:  Negative for behavioral problems (Depression), sleep disturbance and suicidal ideas. The patient is not nervous/anxious.    Vital Signs: BP 140/90    Pulse 82    Temp 98.2 F (36.8 C)    Resp 16    Ht 5' 9.5" (1.765 m)    Wt 176 lb 3.2 oz (79.9 kg)    SpO2 97%    BMI 25.65 kg/m    Physical Exam Vitals reviewed.  Constitutional:      General: He is not in acute distress.    Appearance: Normal appearance. He is not ill-appearing.  HENT:     Head: Normocephalic and atraumatic.  Eyes:     Pupils: Pupils are equal, round, and reactive to light.  Cardiovascular:     Rate and Rhythm:  Normal rate and regular rhythm.  Pulmonary:     Effort: Pulmonary effort is normal. No respiratory distress.  Neurological:     Mental Status: He is alert and oriented to person, place, and time.     Cranial Nerves: No cranial nerve deficit.     Coordination: Coordination normal.     Gait: Gait normal.  Psychiatric:        Mood and Affect: Mood normal.        Behavior: Behavior normal.       Assessment/Plan: 1. Restless leg syndrome Much improved with current medication regimen with pramipexole and pregabalin.  - pregabalin (LYRICA) 50 MG capsule; Take 1 capsule (50 mg total) by mouth daily.  Dispense: 90 capsule; Refill: 0  2. Obstructive  sleep apnea Cannot tolerate CPAP, has turned his CPAP back in to [facility].   3. Other insomnia Improved now that his restless legs are under better control and he is not trying to keep CPAP on anymore.   4. HIV infection, unspecified symptom status ([place]) Started injection therapy to keep his levels undetectable.   General Counseling: [name] understanding of the findings of todays visit and agrees with plan of treatment. I have discussed any further diagnostic evaluation that may be needed or ordered today. We also reviewed his medications today. he has been encouraged to call the office with any questions or concerns that should arise related to todays visit.    No orders of the defined types were placed in this encounter.   Meds ordered this encounter  Medications   pregabalin (LYRICA) 50 MG capsule    Sig: Take 1 capsule (50 mg total) by mouth daily.    Dispense:  90 capsule    Refill:  0    Not to exceed 4 additional fills before 07/29/2021, please discontinue previous pregabalin order. Patient is requesting 90-day supply    Return in 13 weeks (on 07/31/2021) for previously scheduled, CPE, [name] PCP.   Total time spent:30 Minutes Time spent includes review of chart, medications, test results, and follow up plan with the  patient.    Controlled Substance Database was reviewed by me.  This patient was seen by Jonetta Osgood, FNP-C in collaboration with Dr. Clayborn Bigness as a part of collaborative care agreement.   Justin Scism R. Valetta Fuller, MSN, FNP-C Internal medicine

## 2021-05-17 ENCOUNTER — Other Ambulatory Visit: Payer: Self-pay | Admitting: Internal Medicine

## 2021-05-28 DIAGNOSIS — B20 Human immunodeficiency virus [HIV] disease: Secondary | ICD-10-CM | POA: Diagnosis not present

## 2021-05-29 ENCOUNTER — Other Ambulatory Visit: Payer: Self-pay | Admitting: Internal Medicine

## 2021-05-30 DIAGNOSIS — G4733 Obstructive sleep apnea (adult) (pediatric): Secondary | ICD-10-CM | POA: Diagnosis not present

## 2021-06-03 ENCOUNTER — Encounter: Payer: Self-pay | Admitting: Nurse Practitioner

## 2021-06-15 ENCOUNTER — Other Ambulatory Visit: Payer: Self-pay | Admitting: Nurse Practitioner

## 2021-06-15 ENCOUNTER — Other Ambulatory Visit: Payer: Self-pay | Admitting: Internal Medicine

## 2021-06-15 DIAGNOSIS — F411 Generalized anxiety disorder: Secondary | ICD-10-CM

## 2021-06-15 DIAGNOSIS — G2581 Restless legs syndrome: Secondary | ICD-10-CM

## 2021-06-20 DIAGNOSIS — R7989 Other specified abnormal findings of blood chemistry: Secondary | ICD-10-CM | POA: Diagnosis not present

## 2021-06-27 DIAGNOSIS — G4733 Obstructive sleep apnea (adult) (pediatric): Secondary | ICD-10-CM | POA: Diagnosis not present

## 2021-07-04 ENCOUNTER — Other Ambulatory Visit: Payer: Self-pay

## 2021-07-04 ENCOUNTER — Encounter: Payer: Self-pay | Admitting: Nurse Practitioner

## 2021-07-04 ENCOUNTER — Ambulatory Visit: Payer: BC Managed Care – PPO | Admitting: Nurse Practitioner

## 2021-07-04 VITALS — BP 140/80 | HR 93 | Temp 98.4°F | Resp 16 | Ht 69.5 in | Wt 184.6 lb

## 2021-07-04 DIAGNOSIS — K921 Melena: Secondary | ICD-10-CM | POA: Diagnosis not present

## 2021-07-04 DIAGNOSIS — G2581 Restless legs syndrome: Secondary | ICD-10-CM | POA: Diagnosis not present

## 2021-07-04 DIAGNOSIS — L089 Local infection of the skin and subcutaneous tissue, unspecified: Secondary | ICD-10-CM

## 2021-07-04 DIAGNOSIS — B009 Herpesviral infection, unspecified: Secondary | ICD-10-CM | POA: Diagnosis not present

## 2021-07-04 MED ORDER — PREGABALIN 75 MG PO CAPS: 75.0000 mg | ORAL_CAPSULE | Freq: Every day | ORAL | 2 refills | Status: DC

## 2021-07-04 MED ORDER — FAMCICLOVIR 500 MG PO TABS: ORAL_TABLET | ORAL | 3 refills | Status: DC

## 2021-07-04 MED ORDER — MUPIROCIN 2 % EX OINT: 1.0000 "application " | TOPICAL_OINTMENT | Freq: Two times a day (BID) | CUTANEOUS | 2 refills | Status: DC

## 2021-07-04 NOTE — Progress Notes (Signed)
[place] ?[address] ?[place], [place] [address] ? ?Internal MEDICINE  ?Office Visit Note ? ?Patient Name: [name] ? [id number]  ?[id number] ? ?Date of Service: 07/04/2021 ? ?Chief Complaint  ?Patient presents with  ? Acute Visit  ?  Blood in the stool, has been going on for about 3 weeks, then once or twice the last couple of months prior to this  ? ? ? ?HPI ?Spenser presents for an acute sick visit for bloody stools. He reports that he has been having soft and liquid stools and then after a stool, he has bright red blood from his rectum. He denies any rectal pain, pressure or itching. He has 2-3 BMs per day. This has been going on for more than 3 weeks. He denies any abdominal pain, nausea, vomiting, constipation. He also has open sores that have drained some clear and yellow drainage on his upper and scalp.  ?Still having difficulty sleeping usually due to restless legs.  ?Previously seen by Dr. [facility] at [facility] in [place] and this is who he would like to see again.  ? ? ? ? ? ?Current Medication: ? ?Outpatient Encounter Medications as of 07/04/2021  ?Medication Sig  ? albuterol (VENTOLIN HFA) 108 (90 Base) MCG/ACT inhaler Inhale 2 puffs into the lungs every 6 (six) hours as needed for wheezing or shortness of breath.  ? bictegravir-emtricitabine-tenofovir AF (BIKTARVY) 50-200-25 MG TABS tablet Take 1 tablet by mouth daily.   ? cetirizine (ZYRTEC) 10 MG tablet Take 10 mg by mouth daily.  ? escitalopram (LEXAPRO) 20 MG tablet TAKE 1 TAB BY MOUTH DAILY WITH SUPPER (NEED APPT FOR REFILL)  ? famciclovir (FAMVIR) 500 MG tablet Take 1 tablet by mouth twice daily during a flare up/outbreak until resolved.  ? fluticasone (FLONASE) 50 MCG/ACT nasal spray Place 1 spray into both nostrils daily.  ? lisinopril (ZESTRIL) 10 MG tablet TAKE 1 TABLET BY MOUTH EVERY DAY  ? mometasone-formoterol (DULERA) 200-5 MCG/ACT AERO Inhale into the lungs.  ? mupirocin ointment (BACTROBAN) 2 % Apply 1 application. topically 2  (two) times daily.  ? pramipexole (MIRAPEX) 0.5 MG tablet TAKE 1 TABLET (0.5 MG TOTAL) BY MOUTH AT BEDTIME. TAKE 2-3 HOURS PRIOR TO GOING TO BED.  ? pregabalin (LYRICA) 75 MG capsule Take 1 capsule (75 mg total) by mouth at bedtime.  ? [DISCONTINUED] cyclobenzaprine (FLEXERIL) 10 MG tablet TAKE 1 TABLET BY MOUTH TWICE A DAY AS NEEDED FOR MUSCLE SPASM  ? [DISCONTINUED] pregabalin (LYRICA) 50 MG capsule Take 1 capsule (50 mg total) by mouth daily.  ? [DISCONTINUED] valACYclovir (VALTREX) 1000 MG tablet Take 1,000 mg by mouth 2 (two) times daily.  ? ?No facility-administered encounter medications on file as of 07/04/2021.  ? ? ? ? ?Medical History: ?Past Medical History:  ?Diagnosis Date  ? Allergy   ? COPD (chronic obstructive pulmonary disease) ([place])   ? Frostbite of both hands   ? and left arm  ? HIV (human immunodeficiency virus infection) ([place])   ? Hypertension   ? Sleep apnea   ? ? ? ?Vital Signs: ?BP 140/80   Pulse 93   Temp 98.4 ?F (36.9 ?C)   Resp 16   Ht 5' 9.5" (1.765 m)   Wt 184 lb 9.6 oz (83.7 kg)   SpO2 97%   BMI 26.87 kg/m?  ? ? ?Review of Systems  ?Constitutional:  Positive for fatigue. Negative for chills and unexpected weight change.  ?HENT:  Negative for congestion, rhinorrhea, sneezing and sore  throat.   ?Eyes:  Negative for redness.  ?Respiratory:  Negative for cough, chest tightness and shortness of breath.   ?Cardiovascular: Negative.  Negative for chest pain and palpitations.  ?Gastrointestinal:  Positive for blood in stool. Negative for abdominal distention, abdominal pain, constipation, diarrhea, nausea and vomiting.  ?Genitourinary:  Negative for dysuria and frequency.  ?Musculoskeletal:  Positive for arthralgias and back pain. Negative for joint swelling and neck pain.  ?Skin:  Negative for rash.  ?Neurological: Negative.  Negative for tremors and numbness.  ?Hematological:  Negative for adenopathy. Does not bruise/bleed easily.  ?Psychiatric/Behavioral:  Positive for sleep  disturbance. Negative for behavioral problems (Depression), self-injury and suicidal ideas. The patient is not nervous/anxious.   ? ?Physical Exam ?Vitals reviewed.  ?Constitutional:   ?   General: He is not in acute distress. ?   Appearance: Normal appearance. He is normal weight. He is not ill-appearing.  ?HENT:  ?   Head: Normocephalic and atraumatic.  ?Eyes:  ?   Pupils: Pupils are equal, round, and reactive to light.  ?Cardiovascular:  ?   Rate and Rhythm: Normal rate and regular rhythm.  ?Pulmonary:  ?   Effort: Pulmonary effort is normal. No respiratory distress.  ?Abdominal:  ?   General: Bowel sounds are normal. There is no distension.  ?   Palpations: Abdomen is soft. There is no mass.  ?   Tenderness: There is no abdominal tenderness. There is no guarding or rebound.  ?   Hernia: No hernia is present.  ?Neurological:  ?   Mental Status: He is alert and oriented to person, place, and time.  ?Psychiatric:     ?   Mood and Affect: Mood normal.     ?   Behavior: Behavior normal.  ? ? ? ? ?Assessment/Plan: ?1. Bloody stools ?Labs ordered to assess for anemia, referred to GI for further evaluation.  ?- CBC with Differential/Platelet ?- Iron, TIBC and Ferritin Panel ?- Ambulatory referral to Gastroenterology ? ?2. Infected skin lesion ?Topical antibiotic ointment prescribed, may apply to all skin lesion to help them heal and any developing infection to resolve. Multiple on scalp and upper extremities. Consider dermatology referral only if lesions do not heal or they recur.  ?- mupirocin ointment (BACTROBAN) 2 %; Apply 1 application. topically 2 (two) times daily.  Dispense: 30 g; Refill: 2 ? ?3. Restless leg syndrome ?Pregabalin dose increased.  ?- pregabalin (LYRICA) 75 MG capsule; Take 1 capsule (75 mg total) by mouth at bedtime.  Dispense: 30 capsule; Refill: 2 ? ?4. Recurrent herpes simplex ?Discontinued previous antiviral, which was not working well for patient anymore, famciclovir prescribed.  ?-  famciclovir (FAMVIR) 500 MG tablet; Take 1 tablet by mouth twice daily during a flare up/outbreak until resolved.  Dispense: 60 tablet; Refill: 3 ? ? ?General Counseling: Marylou Mccoy understanding of the findings of todays visit and agrees with plan of treatment. I have discussed any further diagnostic evaluation that may be needed or ordered today. We also reviewed his medications today. he has been encouraged to call the office with any questions or concerns that should arise related to todays visit. ? ? ? ?Counseling: ? ? ? ?Orders Placed This Encounter  ?Procedures  ? CBC with Differential/Platelet  ? Iron, TIBC and Ferritin Panel  ? Ambulatory referral to Gastroenterology  ? ? ?Meds ordered this encounter  ?Medications  ? mupirocin ointment (BACTROBAN) 2 %  ?  Sig: Apply 1 application. topically 2 (two) times daily.  ?  Dispense:  30 g  ?  Refill:  2  ? famciclovir (FAMVIR) 500 MG tablet  ?  Sig: Take 1 tablet by mouth twice daily during a flare up/outbreak until resolved.  ?  Dispense:  60 tablet  ?  Refill:  3  ? pregabalin (LYRICA) 75 MG capsule  ?  Sig: Take 1 capsule (75 mg total) by mouth at bedtime.  ?  Dispense:  30 capsule  ?  Refill:  2  ? ? ?Return if symptoms worsen or fail to improve, for referred to GI. ? ?[place] Controlled Substance Database was reviewed by me for overdose risk score (ORS) ? ?Time spent:30 Minutes ?Time spent with patient included reviewing progress notes, labs, imaging studies, and discussing plan for follow up.  ? ?This patient was seen by Jonetta Osgood, FNP-C in collaboration with Dr. Clayborn Bigness as a part of collaborative care agreement. ? ?Kenijah Benningfield R. Valetta Fuller, MSN, FNP-C ?Internal Medicine ?

## 2021-07-05 ENCOUNTER — Telehealth: Payer: Self-pay

## 2021-07-05 NOTE — Telephone Encounter (Signed)
CALLED PATIENT NO ANSWER LEFT VOICEMAIL FOR A CALL BACK ? ?

## 2021-07-06 ENCOUNTER — Telehealth: Payer: Self-pay

## 2021-07-06 NOTE — Telephone Encounter (Signed)
Scheduled for 08/22/2021 ?

## 2021-07-10 ENCOUNTER — Other Ambulatory Visit: Payer: Self-pay | Admitting: Nurse Practitioner

## 2021-07-11 DIAGNOSIS — L989 Disorder of the skin and subcutaneous tissue, unspecified: Secondary | ICD-10-CM | POA: Diagnosis not present

## 2021-07-11 DIAGNOSIS — I10 Essential (primary) hypertension: Secondary | ICD-10-CM | POA: Diagnosis not present

## 2021-07-11 DIAGNOSIS — R7989 Other specified abnormal findings of blood chemistry: Secondary | ICD-10-CM | POA: Diagnosis not present

## 2021-07-11 DIAGNOSIS — B20 Human immunodeficiency virus [HIV] disease: Secondary | ICD-10-CM | POA: Diagnosis not present

## 2021-07-20 ENCOUNTER — Encounter: Payer: Self-pay | Admitting: Nurse Practitioner

## 2021-07-28 DIAGNOSIS — G4733 Obstructive sleep apnea (adult) (pediatric): Secondary | ICD-10-CM | POA: Diagnosis not present

## 2021-07-30 DIAGNOSIS — K921 Melena: Secondary | ICD-10-CM | POA: Diagnosis not present

## 2021-07-31 ENCOUNTER — Encounter: Payer: BC Managed Care – PPO | Admitting: Nurse Practitioner

## 2021-07-31 LAB — CBC WITH DIFFERENTIAL/PLATELET
Basophils Absolute: 0.1 10*3/uL (ref 0.0–0.2)
Basos: 1 %
EOS (ABSOLUTE): 0.2 10*3/uL (ref 0.0–0.4)
Eos: 2 %
Hematocrit: 44.8 % (ref 37.5–51.0)
Hemoglobin: 15.6 g/dL (ref 13.0–17.7)
Immature Grans (Abs): 0.1 10*3/uL (ref 0.0–0.1)
Immature Granulocytes: 1 %
Lymphocytes Absolute: 2.3 10*3/uL (ref 0.7–3.1)
Lymphs: 29 %
MCH: 33.7 pg — ABNORMAL HIGH (ref 26.6–33.0)
MCHC: 34.8 g/dL (ref 31.5–35.7)
MCV: 97 fL (ref 79–97)
Monocytes Absolute: 1 10*3/uL — ABNORMAL HIGH (ref 0.1–0.9)
Monocytes: 12 %
Neutrophils Absolute: 4.5 10*3/uL (ref 1.4–7.0)
Neutrophils: 55 %
Platelets: 251 10*3/uL (ref 150–450)
RBC: 4.63 x10E6/uL (ref 4.14–5.80)
RDW: 13.5 % (ref 11.6–15.4)
WBC: 8.1 10*3/uL (ref 3.4–10.8)

## 2021-07-31 LAB — IRON,TIBC AND FERRITIN PANEL
Ferritin: 827 ng/mL — ABNORMAL HIGH (ref 30–400)
Iron Saturation: 61 % — ABNORMAL HIGH (ref 15–55)
Iron: 197 ug/dL — ABNORMAL HIGH (ref 38–169)
Total Iron Binding Capacity: 325 ug/dL (ref 250–450)
UIBC: 128 ug/dL (ref 111–343)

## 2021-08-06 NOTE — Progress Notes (Signed)
I have reviewed the lab results. There are no critically abnormal values requiring immediate intervention but there are some abnormals that will be discussed at the next office visit on 4/27 ?

## 2021-08-13 ENCOUNTER — Telehealth: Payer: Self-pay

## 2021-08-13 NOTE — Telephone Encounter (Signed)
Left vm to confirm 08/16/21 appointment-Toni ?

## 2021-08-16 ENCOUNTER — Encounter: Payer: Self-pay | Admitting: Nurse Practitioner

## 2021-08-16 ENCOUNTER — Ambulatory Visit (INDEPENDENT_AMBULATORY_CARE_PROVIDER_SITE_OTHER): Payer: BC Managed Care – PPO | Admitting: Nurse Practitioner

## 2021-08-16 VITALS — BP 146/83 | HR 91 | Temp 98.6°F | Resp 16 | Ht 69.0 in | Wt 187.6 lb

## 2021-08-16 DIAGNOSIS — R300 Dysuria: Secondary | ICD-10-CM

## 2021-08-16 DIAGNOSIS — R748 Abnormal levels of other serum enzymes: Secondary | ICD-10-CM

## 2021-08-16 DIAGNOSIS — E538 Deficiency of other specified B group vitamins: Secondary | ICD-10-CM

## 2021-08-16 DIAGNOSIS — G4733 Obstructive sleep apnea (adult) (pediatric): Secondary | ICD-10-CM

## 2021-08-16 DIAGNOSIS — I7300 Raynaud's syndrome without gangrene: Secondary | ICD-10-CM

## 2021-08-16 DIAGNOSIS — I10 Essential (primary) hypertension: Secondary | ICD-10-CM | POA: Diagnosis not present

## 2021-08-16 DIAGNOSIS — G2581 Restless legs syndrome: Secondary | ICD-10-CM

## 2021-08-16 DIAGNOSIS — E782 Mixed hyperlipidemia: Secondary | ICD-10-CM

## 2021-08-16 DIAGNOSIS — R7989 Other specified abnormal findings of blood chemistry: Secondary | ICD-10-CM

## 2021-08-16 DIAGNOSIS — E559 Vitamin D deficiency, unspecified: Secondary | ICD-10-CM

## 2021-08-16 DIAGNOSIS — Z0001 Encounter for general adult medical examination with abnormal findings: Secondary | ICD-10-CM

## 2021-08-16 DIAGNOSIS — J449 Chronic obstructive pulmonary disease, unspecified: Secondary | ICD-10-CM

## 2021-08-16 MED ORDER — LISINOPRIL 10 MG PO TABS: 20.0000 mg | ORAL_TABLET | Freq: Every day | ORAL | 1 refills | Status: DC

## 2021-08-16 MED ORDER — PRAMIPEXOLE DIHYDROCHLORIDE 0.5 MG PO TABS: 0.5000 mg | ORAL_TABLET | Freq: Every day | ORAL | 0 refills | Status: DC

## 2021-08-16 MED ORDER — AMLODIPINE BESYLATE 5 MG PO TABS: 5.0000 mg | ORAL_TABLET | Freq: Every day | ORAL | 1 refills | Status: DC

## 2021-08-16 NOTE — Progress Notes (Signed)
[facility] [place], [place] [address]  Internal MEDICINE  Office Visit Note  Patient Name: [name]  [id number]  [id number]  Date of Service: 08/16/2021  Chief Complaint  Patient presents with   Annual Exam   Hypertension   COPD    HPI [name] presents for an annual well visit and physical exam.  He is a well-appearing 56 year old male with hypertension, restless leg syndrome, chronic insomnia, obstructive sleep apnea, COPD, HIV.  He is currently being followed by an infectious disease specialist and he receives medication and treatment.  In the past year he has started injections and his levels are frequently monitored and he is stable.  He has been having some persistently higher blood pressures than what was typical for him. he has been taking extra lisinopril with no significant change. He is due for routine labs.  He had a colonoscopy in 2020 so he is due for a repeat in 2025.  Will order PSA level for prostate cancer screening with his labs. He is also been having issues with Raynaud's phenomenon with losing color in his hands accompanied by pain and cold upper extremities.  He does also have a history of frostbite of both of his hands He continues to smoke 1.5 packs/day of cigarettes and is not ready to quit at this time. He does still drink alcohol usually on the weekends and his drink of choice is typically multiple shots of liquor.   Current Medication: Outpatient Encounter Medications as of 08/16/2021  Medication Sig   albuterol (VENTOLIN HFA) 108 (90 Base) MCG/ACT inhaler Inhale 2 puffs into the lungs every 6 (six) hours as needed for wheezing or shortness of breath.   amLODipine (NORVASC) 5 MG tablet Take 1 tablet (5 mg total) by mouth daily.   bictegravir-emtricitabine-tenofovir AF (BIKTARVY) 50-200-25 MG TABS tablet Take 1 tablet by mouth daily.    cetirizine (ZYRTEC) 10 MG tablet Take 10 mg by mouth daily.   cyclobenzaprine (FLEXERIL) 10 MG  tablet TAKE 1 TABLET BY MOUTH TWICE A DAY AS NEEDED FOR MUSCLE SPASMS   escitalopram (LEXAPRO) 20 MG tablet TAKE 1 TAB BY MOUTH DAILY WITH SUPPER (NEED APPT FOR REFILL)   famciclovir (FAMVIR) 500 MG tablet Take 1 tablet by mouth twice daily during a flare up/outbreak until resolved.   fluticasone (FLONASE) 50 MCG/ACT nasal spray Place 1 spray into both nostrils daily.   mometasone-formoterol (DULERA) 200-5 MCG/ACT AERO Inhale into the lungs.   mupirocin ointment (BACTROBAN) 2 % Apply 1 application. topically 2 (two) times daily.   pregabalin (LYRICA) 75 MG capsule Take 1 capsule (75 mg total) by mouth at bedtime.   [DISCONTINUED] lisinopril (ZESTRIL) 10 MG tablet TAKE 1 TABLET BY MOUTH EVERY DAY   [DISCONTINUED] pramipexole (MIRAPEX) 0.5 MG tablet TAKE 1 TABLET (0.5 MG TOTAL) BY MOUTH AT BEDTIME. TAKE 2-3 HOURS PRIOR TO GOING TO BED.   lisinopril (ZESTRIL) 10 MG tablet Take 2 tablets (20 mg total) by mouth daily.   pramipexole (MIRAPEX) 0.5 MG tablet Take 1 tablet (0.5 mg total) by mouth at bedtime. Take 2-3 hours prior to going to bed.   No facility-administered encounter medications on file as of 08/16/2021.    Surgical History: Past Surgical History:  Procedure Laterality Date   COLONOSCOPY WITH PROPOFOL N/A 03/22/2019   Procedure: COLONOSCOPY WITH BIOPSIES;  Surgeon: Lucilla Lame, MD;  Location: [facility];  Service: Endoscopy;  Laterality: N/A;   I & D EXTREMITY Left 05/21/2019   Procedure: IRRIGATION  AND DEBRIDEMENT LEFT LEG;  Surgeon: Herbert Pun, MD;  Location: [facility];  Service: General;  Laterality: Left;   I & D EXTREMITY Left 05/25/2019   Procedure: IRRIGATION AND DEBRIDEMENT Left Leg;  Surgeon: Herbert Pun, MD;  Location: [facility];  Service: General;  Laterality: Left;   POLYPECTOMY N/A 03/22/2019   Procedure: POLYPECTOMY;  Surgeon: Lucilla Lame, MD;  Location: [facility];  Service: Endoscopy;  Laterality: N/A;    Medical History: Past  Medical History:  Diagnosis Date   Allergy    COPD (chronic obstructive pulmonary disease) ([place])    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   HIV (human immunodeficiency virus infection) ([place])    Hypertension    Sleep apnea     Family History: Family History  Adopted: Yes    Social History   Socioeconomic History   Marital status: Divorced    Spouse name: Not on file   Number of children: Not on file   Years of education: Not on file   Highest education level: Not on file  Occupational History   Not on file  Tobacco Use   Smoking status: Every Day    Packs/day: 1.50    Years: 30.00    Pack years: 45.00    Types: Cigarettes   Smokeless tobacco: Never   Tobacco comments:    since age 89  Vaping Use   Vaping Use: Never used  Substance and Sexual Activity   Alcohol use: Yes    Alcohol/week: 21.0 standard drinks    Types: 21 Shots of liquor per week    Comment: weekends   Drug use: Never   Sexual activity: Not on file  Other Topics Concern   Not on file  Social History Narrative   Not on file   Social Determinants of Health   Financial Resource Strain: Not on file  Food Insecurity: Not on file  Transportation Needs: Not on file  Physical Activity: Not on file  Stress: Not on file  Social Connections: Not on file  Intimate Partner Violence: Not on file      Review of Systems  Constitutional:  Negative for activity change, appetite change, chills, fatigue, fever and unexpected weight change.  HENT: Negative.  Negative for congestion, ear pain, rhinorrhea, sore throat and trouble swallowing.   Eyes: Negative.   Respiratory: Negative.  Negative for cough, chest tightness, shortness of breath and wheezing.   Cardiovascular: Negative.  Negative for chest pain.  Gastrointestinal: Negative.  Negative for abdominal pain, blood in stool, constipation, diarrhea, nausea and vomiting.  Endocrine: Negative.   Genitourinary: Negative.  Negative for difficulty  urinating, dysuria, frequency, hematuria and urgency.  Musculoskeletal: Negative.  Negative for arthralgias, back pain, joint swelling, myalgias and neck pain.  Skin: Negative.  Negative for rash and wound.  Allergic/Immunologic: Negative.  Negative for immunocompromised state.  Neurological: Negative.  Negative for dizziness, seizures, numbness and headaches.  Hematological: Negative.   Psychiatric/Behavioral: Negative.  Negative for behavioral problems, self-injury and suicidal ideas. The patient is not nervous/anxious.    Vital Signs: BP (!) 146/83   Pulse 91   Temp 98.6 F (37 C)   Resp 16   Ht '5\' 9"'$  (1.753 m)   Wt 187 lb 9.6 oz (85.1 kg)   SpO2 97%   BMI 27.70 kg/m    Physical Exam Vitals reviewed.  Constitutional:      General: He is awake. He is not in acute distress.  Appearance: Normal appearance. He is well-developed, well-groomed and overweight. He is not ill-appearing or diaphoretic.  HENT:     Head: Normocephalic and atraumatic.     Right Ear: Hearing, tympanic membrane, ear canal and external ear normal.     Left Ear: Hearing, tympanic membrane, ear canal and external ear normal.     Nose: Nose normal.     Mouth/Throat:     Mouth: Mucous membranes are moist.     Pharynx: Oropharynx is clear. No oropharyngeal exudate.  Eyes:     General: Lids are normal. Vision grossly intact. Gaze aligned appropriately. No scleral icterus.       Right eye: No discharge.        Left eye: No discharge.     Extraocular Movements: Extraocular movements intact.     Conjunctiva/sclera: Conjunctivae normal.     Pupils: Pupils are equal, round, and reactive to light.     Funduscopic exam:    Right eye: Red reflex present.        Left eye: Red reflex present. Neck:     Thyroid: No thyromegaly.     Vascular: No JVD.     Trachea: Trachea and phonation normal. No tracheal deviation.  Cardiovascular:     Rate and Rhythm: Normal rate and regular rhythm.     Pulses: Normal pulses.      Heart sounds: Normal heart sounds, S1 normal and S2 normal. No murmur heard.    No friction rub. No gallop.  Pulmonary:     Effort: Pulmonary effort is normal. No accessory muscle usage or respiratory distress.     Breath sounds: Normal breath sounds and air entry. No stridor. No wheezing or rales.  Chest:     Chest wall: No tenderness.  Abdominal:     General: Bowel sounds are normal. There is no distension.     Palpations: Abdomen is soft. There is no shifting dullness, fluid wave, mass or pulsatile mass.     Tenderness: There is no abdominal tenderness. There is no guarding or rebound.  Musculoskeletal:        General: No tenderness or deformity. Normal range of motion.     Cervical back: Normal range of motion and neck supple.     Right lower leg: No edema.     Left lower leg: No edema.  Lymphadenopathy:     Cervical: No cervical adenopathy.  Skin:    General: Skin is warm and dry.     Capillary Refill: Capillary refill takes less than 2 seconds.     Coloration: Skin is not pale.     Findings: No erythema or rash.  Neurological:     Mental Status: He is alert and oriented to person, place, and time.     Cranial Nerves: No cranial nerve deficit.     Motor: No abnormal muscle tone.     Coordination: Coordination normal.     Gait: Gait normal.     Deep Tendon Reflexes: Reflexes are normal and symmetric.  Psychiatric:        Mood and Affect: Mood normal.        Behavior: Behavior normal. Behavior is cooperative.        Thought Content: Thought content normal.        Judgment: Judgment normal.        Assessment/Plan: 1. Encounter for routine adult health examination with abnormal findings Age-appropriate preventive screenings and vaccinations discussed, annual physical exam completed. Routine labs for health maintenance ordered, see  below. PHM updated.   2. Raynaud's phenomenon without gangrene Amlodipine 5 mg daily prescribed  3. Essential hypertension Continue  lisinopril 20 mg daily and add amlodipine 5 mg daily, follow-up in 3 weeks. - lisinopril (ZESTRIL) 10 MG tablet; Take 2 tablets (20 mg total) by mouth daily.  Dispense: 180 tablet; Refill: 1  4. Restless leg syndrome May continue pramipexole for restless leg syndrome, refills ordered. - pramipexole (MIRAPEX) 0.5 MG tablet; Take 1 tablet (0.5 mg total) by mouth at bedtime. Take 2-3 hours prior to going to bed.  Dispense: 90 tablet; Refill: 0  5. Chronic obstructive pulmonary disease, unspecified COPD type ([place]) Stable with current treatments.  Patient does continue to smoke cigarettes despite diagnosis.  He is not currently ready to quit smoking and is aware of the risks and possible complications if he continues to smoke cigarettes and use tobacco products.  6. Obstructive sleep apnea Over the past year, patient did try CPAP but was unable to tolerate it and this was a multifactorial issue.  Patient had restless leg syndrome and periodic involuntary limb movements and has had chronic insomnia on top of that so going to sleep is difficult and with the random movements and restless legs he actually threw the machine across the room 1 time and broke it, he did have the machine replaced and tried to use CPAP a while longer but still was not able to tolerate it and sleep well.  7. Elevated liver enzymes Liver enzymes are elevated, patient does drink a significant amount of alcohol on the weekends, repeat hepatic function panel - Hepatic function panel  8. Elevated ferritin level Significantly elevated ferritin level which may be related to his HIV but iron overload does need to be ruled out - Transferrin Saturation  9. B12 deficiency Routine labs ordered - B12 and Folate Panel  10. Mixed hyperlipidemia Routine labs ordered - Lipid Profile  11. Vitamin D deficiency Routine lab ordered - Vitamin D (25 hydroxy)  12. Dysuria Routine urinalysis done - UA/M w/rflx Culture, Routine -  Microscopic Examination    General Counseling: Rhet verbalizes understanding of the findings of todays visit and agrees with plan of treatment. I have discussed any further diagnostic evaluation that may be needed or ordered today. We also reviewed his medications today. he has been encouraged to call the office with any questions or concerns that should arise related to todays visit.    Orders Placed This Encounter  Procedures   Hepatic function panel   Lipid Profile   Transferrin Saturation   B12 and Folate Panel   Vitamin D (25 hydroxy)    Meds ordered this encounter  Medications   amLODipine (NORVASC) 5 MG tablet    Sig: Take 1 tablet (5 mg total) by mouth daily.    Dispense:  90 tablet    Refill:  1   pramipexole (MIRAPEX) 0.5 MG tablet    Sig: Take 1 tablet (0.5 mg total) by mouth at bedtime. Take 2-3 hours prior to going to bed.    Dispense:  90 tablet    Refill:  0   lisinopril (ZESTRIL) 10 MG tablet    Sig: Take 2 tablets (20 mg total) by mouth daily.    Dispense:  180 tablet    Refill:  1    Return in about 3 weeks (around 09/06/2021) for F/U, eval new med, BP check, Labs, Eddy Liszewski PCP.   Total time spent:30 Minutes Time spent includes review of chart, medications, test results,  and follow up plan with the patient.   [place] Controlled Substance Database was reviewed by me.  This patient was seen by Jonetta Osgood, FNP-C in collaboration with Dr. Clayborn Bigness as a part of collaborative care agreement.  Dreonna Hussein R. Valetta Fuller, MSN, FNP-C Internal medicine

## 2021-08-17 LAB — UA/M W/RFLX CULTURE, ROUTINE
Bilirubin, UA: NEGATIVE
Glucose, UA: NEGATIVE
Ketones, UA: NEGATIVE
Leukocytes,UA: NEGATIVE
Nitrite, UA: NEGATIVE
Protein,UA: NEGATIVE
RBC, UA: NEGATIVE
Specific Gravity, UA: 1.009 (ref 1.005–1.030)
Urobilinogen, Ur: 0.2 mg/dL (ref 0.2–1.0)
pH, UA: 7 (ref 5.0–7.5)

## 2021-08-17 LAB — MICROSCOPIC EXAMINATION
Bacteria, UA: NONE SEEN
Casts: NONE SEEN /LPF
Epithelial Cells (non renal): NONE SEEN /HPF (ref 0–10)
RBC, Urine: NONE SEEN /HPF (ref 0–2)
WBC, UA: NONE SEEN /HPF (ref 0–5)

## 2021-08-21 DIAGNOSIS — G4733 Obstructive sleep apnea (adult) (pediatric): Secondary | ICD-10-CM | POA: Diagnosis not present

## 2021-08-22 ENCOUNTER — Ambulatory Visit: Payer: BC Managed Care – PPO | Admitting: Gastroenterology

## 2021-08-30 DIAGNOSIS — E559 Vitamin D deficiency, unspecified: Secondary | ICD-10-CM | POA: Diagnosis not present

## 2021-08-30 DIAGNOSIS — E782 Mixed hyperlipidemia: Secondary | ICD-10-CM | POA: Diagnosis not present

## 2021-08-30 DIAGNOSIS — R7989 Other specified abnormal findings of blood chemistry: Secondary | ICD-10-CM | POA: Diagnosis not present

## 2021-08-30 DIAGNOSIS — E538 Deficiency of other specified B group vitamins: Secondary | ICD-10-CM | POA: Diagnosis not present

## 2021-08-30 DIAGNOSIS — R748 Abnormal levels of other serum enzymes: Secondary | ICD-10-CM | POA: Diagnosis not present

## 2021-09-04 ENCOUNTER — Ambulatory Visit: Payer: BC Managed Care – PPO | Admitting: Nurse Practitioner

## 2021-09-04 ENCOUNTER — Encounter: Payer: Self-pay | Admitting: Nurse Practitioner

## 2021-09-04 ENCOUNTER — Telehealth: Payer: Self-pay

## 2021-09-04 VITALS — BP 137/85 | HR 85 | Temp 98.4°F | Resp 16 | Ht 69.0 in | Wt 185.6 lb

## 2021-09-04 DIAGNOSIS — R748 Abnormal levels of other serum enzymes: Secondary | ICD-10-CM | POA: Diagnosis not present

## 2021-09-04 DIAGNOSIS — E559 Vitamin D deficiency, unspecified: Secondary | ICD-10-CM | POA: Diagnosis not present

## 2021-09-04 DIAGNOSIS — R918 Other nonspecific abnormal finding of lung field: Secondary | ICD-10-CM

## 2021-09-04 DIAGNOSIS — L089 Local infection of the skin and subcutaneous tissue, unspecified: Secondary | ICD-10-CM

## 2021-09-04 DIAGNOSIS — I10 Essential (primary) hypertension: Secondary | ICD-10-CM | POA: Diagnosis not present

## 2021-09-04 DIAGNOSIS — R7989 Other specified abnormal findings of blood chemistry: Secondary | ICD-10-CM

## 2021-09-04 MED ORDER — AMLODIPINE BESYLATE 10 MG PO TABS: 10.0000 mg | ORAL_TABLET | Freq: Every day | ORAL | 1 refills | Status: DC

## 2021-09-04 MED ORDER — MUPIROCIN 2 % EX OINT: 1.0000 "application " | TOPICAL_OINTMENT | Freq: Two times a day (BID) | CUTANEOUS | 2 refills | Status: DC

## 2021-09-04 NOTE — Progress Notes (Signed)
[facility] [place], [place] [address]  Internal MEDICINE  Office Visit Note  Patient Name: [name]  [id number]  [id number]  Date of Service: 09/04/2021  Chief Complaint  Patient presents with   Follow-up   Hypertension   Results   COPD    HPI Kaci presents for a follow-up visit for hypertension and lab results.  At his previous office visit, he was started on amlodipine 5 mg daily and stopped the lisinopril.  He has been checking his blood pressure daily and keeps the recordings on an app on his phone and states that the numbers have been pretty much the same as when he was on lisinopril.  His blood pressure in the office today is slightly improved from his blood pressure from his previous office visit. Patient had a CT chest done in late May last year for lung cancer screening and it is recommended to repeat this a year later so he is requesting an order for the CT scan.  Some small scattered nodules were found on his previous scan. Lab results were reviewed with the patient: -- Due to patient having significantly elevated iron and ferritin levels, his B12 and folate were checked and were normal.  -- Vitamin D level is low normal at 31 --His AST was previously elevated so a hepatic function panel was ordered and his AST has elevated further to 70 from 51.  Patient reports that he has decreased the amount of alcohol he drinks. --His transferrin saturation is still pending, this lab had to be sent out to a lab in [place].  This lab was ordered mainly to differentiate whether his elevated ferritin and iron levels are due to iron overload or if this is an expected abnormality related to HIV. --His lipid panel was repeated and has shown significant improvement with his triglycerides at 245 which is significantly improved from 467 and his VLDL dropped to 41 from 67.  His HDL has also improved up to normal limits.  His cholesterol/HDL ratio is 4.1 which is less  than average risk of developing heart disease.  The 10-year ASCVD risk score (Arnett DK, et al., 2019) is: 11.8%   Values used to calculate the score:     Age: 56 years     Sex: Male     Is Non-Hispanic African American: No     Diabetic: No     Tobacco smoker: Yes     Systolic Blood Pressure: 749 mmHg     Is BP treated: Yes     HDL Cholesterol: 42 mg/dL     Total Cholesterol: 172 mg/dL    Current Medication: Outpatient Encounter Medications as of 09/04/2021  Medication Sig   albuterol (VENTOLIN HFA) 108 (90 Base) MCG/ACT inhaler Inhale 2 puffs into the lungs every 6 (six) hours as needed for wheezing or shortness of breath.   amLODipine (NORVASC) 10 MG tablet Take 1 tablet (10 mg total) by mouth daily.   bictegravir-emtricitabine-tenofovir AF (BIKTARVY) 50-200-25 MG TABS tablet Take 1 tablet by mouth daily.    cetirizine (ZYRTEC) 10 MG tablet Take 10 mg by mouth daily.   cyclobenzaprine (FLEXERIL) 10 MG tablet TAKE 1 TABLET BY MOUTH TWICE A DAY AS NEEDED FOR MUSCLE SPASMS   escitalopram (LEXAPRO) 20 MG tablet TAKE 1 TAB BY MOUTH DAILY WITH SUPPER (NEED APPT FOR REFILL)   famciclovir (FAMVIR) 500 MG tablet Take 1 tablet by mouth twice daily during a flare up/outbreak until resolved.   fluticasone (FLONASE)  50 MCG/ACT nasal spray Place 1 spray into both nostrils daily.   lisinopril (ZESTRIL) 10 MG tablet Take 2 tablets (20 mg total) by mouth daily.   mometasone-formoterol (DULERA) 200-5 MCG/ACT AERO Inhale into the lungs.   pramipexole (MIRAPEX) 0.5 MG tablet Take 1 tablet (0.5 mg total) by mouth at bedtime. Take 2-3 hours prior to going to bed.   pregabalin (LYRICA) 75 MG capsule Take 1 capsule (75 mg total) by mouth at bedtime.   [DISCONTINUED] amLODipine (NORVASC) 5 MG tablet Take 1 tablet (5 mg total) by mouth daily.   [DISCONTINUED] mupirocin ointment (BACTROBAN) 2 % Apply 1 application. topically 2 (two) times daily.   mupirocin ointment (BACTROBAN) 2 % Apply 1 application.  topically 2 (two) times daily.   No facility-administered encounter medications on file as of 09/04/2021.    Surgical History: Past Surgical History:  Procedure Laterality Date   COLONOSCOPY WITH PROPOFOL N/A 03/22/2019   Procedure: COLONOSCOPY WITH BIOPSIES;  Surgeon: Lucilla Lame, MD;  Location: [place];  Service: Endoscopy;  Laterality: N/A;   I & D EXTREMITY Left 05/21/2019   Procedure: IRRIGATION AND DEBRIDEMENT LEFT LEG;  Surgeon: Herbert Pun, MD;  Location: [facility];  Service: General;  Laterality: Left;   I & D EXTREMITY Left 05/25/2019   Procedure: IRRIGATION AND DEBRIDEMENT Left Leg;  Surgeon: Herbert Pun, MD;  Location: [facility];  Service: General;  Laterality: Left;   POLYPECTOMY N/A 03/22/2019   Procedure: POLYPECTOMY;  Surgeon: Lucilla Lame, MD;  Location: [place];  Service: Endoscopy;  Laterality: N/A;    Medical History: Past Medical History:  Diagnosis Date   Allergy    COPD (chronic obstructive pulmonary disease) ([place])    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   HIV (human immunodeficiency virus infection) ([place])    Hypertension    Sleep apnea     Family History: Family History  Adopted: Yes    Social History   Socioeconomic History   Marital status: Divorced    Spouse name: Not on file   Number of children: Not on file   Years of education: Not on file   Highest education level: Not on file  Occupational History   Not on file  Tobacco Use   Smoking status: Every Day    Packs/day: 1.50    Years: 30.00    Pack years: 45.00    Types: Cigarettes   Smokeless tobacco: Never   Tobacco comments:    since age 41  Vaping Use   Vaping Use: Never used  Substance and Sexual Activity   Alcohol use: Yes    Alcohol/week: 21.0 standard drinks    Types: 21 Shots of liquor per week    Comment: weekends   Drug use: Never   Sexual activity: Not on file  Other Topics Concern   Not on file  Social History Narrative    Not on file   Social Determinants of Health   Financial Resource Strain: Not on file  Food Insecurity: Not on file  Transportation Needs: Not on file  Physical Activity: Not on file  Stress: Not on file  Social Connections: Not on file  Intimate Partner Violence: Not on file      Review of Systems  Constitutional:  Negative for chills, fatigue and unexpected weight change.  HENT:  Negative for congestion, rhinorrhea, sneezing and sore throat.   Eyes:  Negative for redness.  Respiratory:  Negative for cough, chest tightness and shortness  of breath.   Cardiovascular:  Negative for chest pain and palpitations.  Gastrointestinal:  Negative for abdominal pain, constipation, diarrhea, nausea and vomiting.  Genitourinary:  Negative for dysuria and frequency.  Musculoskeletal:  Negative for arthralgias, back pain, joint swelling and neck pain.  Skin:  Negative for rash.  Neurological: Negative.  Negative for tremors and numbness.  Hematological:  Negative for adenopathy. Does not bruise/bleed easily.  Psychiatric/Behavioral:  Negative for behavioral problems (Depression), sleep disturbance and suicidal ideas. The patient is not nervous/anxious.    Vital Signs: BP 137/85   Pulse 85   Temp 98.4 F (36.9 C)   Resp 16   Ht '5\' 9"'$  (1.753 m)   Wt 185 lb 9.6 oz (84.2 kg)   SpO2 97%   BMI 27.41 kg/m    Physical Exam Vitals reviewed.  Constitutional:      General: He is not in acute distress.    Appearance: Normal appearance. He is not ill-appearing.  HENT:     Head: Normocephalic and atraumatic.  Eyes:     Pupils: Pupils are equal, round, and reactive to light.  Cardiovascular:     Rate and Rhythm: Normal rate and regular rhythm.  Pulmonary:     Effort: Pulmonary effort is normal. No respiratory distress.  Neurological:     Mental Status: He is alert and oriented to person, place, and time.     Cranial Nerves: No cranial nerve deficit.     Coordination: Coordination  normal.     Gait: Gait normal.  Psychiatric:        Mood and Affect: Mood normal.        Behavior: Behavior normal.       Assessment/Plan: 1. Essential hypertension Amlodipine dose increased to 10 mg daily.  Discontinue lisinopril.  Follow-up in 4 weeks - amLODipine (NORVASC) 10 MG tablet; Take 1 tablet (10 mg total) by mouth daily.  Dispense: 90 tablet; Refill: 1  2. Elevated liver enzymes AST level is further elevated from previous labs and patient has not had an ultrasound of the liver.  Ultrasound ordered to be done at [facility]. - US Abdomen Limited RUQ (LIVER/GB); Future  3. Multiple pulmonary nodules CT scan ordered - CT Chest Wo Contrast; Future  4. Vitamin D deficiency Vitamin D level is low normal, recommended patient continue an over-the-counter vitamin D supplement.  Patient is currently taking a multivitamin but patient was encouraged to add an additional vitamin D supplement if there is not enough vitamin D in his multivitamin.  5. Elevated ferritin level Currently still waiting for the results of the transferrin saturation but the elevated ferritin and iron levels are most likely an expected abnormality related to HIV and less likely to be iron overload.  6. Infected skin lesion The mupirocin ointment is helping to clear the skin lesions that are infected and that pop up randomly.  Patient is requesting refills which were sent to pharmacy. - mupirocin ointment (BACTROBAN) 2 %; Apply 1 application. topically 2 (two) times daily.  Dispense: 30 g; Refill: 2   General Counseling: Isa verbalizes understanding of the findings of todays visit and agrees with plan of treatment. I have discussed any further diagnostic evaluation that may be needed or ordered today. We also reviewed his medications today. he has been encouraged to call the office with any questions or concerns that should arise related to todays visit.    Orders Placed This Encounter   Procedures   CT Chest Wo Contrast  US Abdomen Limited RUQ (LIVER/GB)    Meds ordered this encounter  Medications   amLODipine (NORVASC) 10 MG tablet    Sig: Take 1 tablet (10 mg total) by mouth daily.    Dispense:  90 tablet    Refill:  1    Note increased dose, discontinue 5 mg tablet, please fill new prescription asap   mupirocin ointment (BACTROBAN) 2 %    Sig: Apply 1 application. topically 2 (two) times daily.    Dispense:  30 g    Refill:  2    Return in about 1 month (around 10/05/2021) for F/U, BP check, Daoud Lobue PCP.   Total time spent:30 Minutes Time spent includes review of chart, medications, test results, and follow up plan with the patient.   [place] Controlled Substance Database was reviewed by me.  This patient was seen by Jonetta Osgood, FNP-C in collaboration with Dr. Clayborn Bigness as a part of collaborative care agreement.   Lysbeth Dicola R. Valetta Fuller, MSN, FNP-C Internal medicine

## 2021-09-04 NOTE — Telephone Encounter (Signed)
Routine chest CT ordered. Printed. Gave to Titania-Toni ?

## 2021-09-05 LAB — LIPID PANEL
Chol/HDL Ratio: 4.1 ratio (ref 0.0–5.0)
Cholesterol, Total: 172 mg/dL (ref 100–199)
HDL: 42 mg/dL (ref >39)
LDL Chol Calc (NIH): 89 mg/dL (ref 0–99)
Triglycerides: 245 mg/dL — ABNORMAL HIGH (ref 0–149)
VLDL Cholesterol Cal: 41 mg/dL — ABNORMAL HIGH (ref 5–40)

## 2021-09-05 LAB — B12 AND FOLATE PANEL
Folate: 17 ng/mL (ref >3.0)
Vitamin B-12: 667 pg/mL (ref 232–1245)

## 2021-09-05 LAB — TRANSFERRIN SATURATION
IRON SATN MFR SERPL: 27 % Saturation
IRON SERPL-MCNC: 98 ug/dL
TRANSFERRIN SERPL-MCNC: 261 mg/dL

## 2021-09-05 LAB — HEPATIC FUNCTION PANEL
ALT: 43 IU/L (ref 0–44)
AST: 70 IU/L — ABNORMAL HIGH (ref 0–40)
Albumin: 4.1 g/dL (ref 3.8–4.9)
Alkaline Phosphatase: 66 IU/L (ref 44–121)
Bilirubin Total: 0.5 mg/dL (ref 0.0–1.2)
Bilirubin, Direct: 0.16 mg/dL (ref 0.00–0.40)
Total Protein: 6.5 g/dL (ref 6.0–8.5)

## 2021-09-05 LAB — VITAMIN D 25 HYDROXY (VIT D DEFICIENCY, FRACTURES): Vit D, 25-Hydroxy: 31.3 ng/mL (ref 30.0–100.0)

## 2021-09-11 ENCOUNTER — Telehealth: Payer: Self-pay

## 2021-09-11 NOTE — Telephone Encounter (Signed)
Patient scheduled for ct chest on 09/25/21 @ 10:30 kp/tat

## 2021-09-14 ENCOUNTER — Telehealth: Payer: Self-pay

## 2021-09-14 NOTE — Telephone Encounter (Signed)
Order for OSA supplies signed by provider and placed in [facility] folder. 

## 2021-09-25 ENCOUNTER — Ambulatory Visit
Admission: RE | Admit: 2021-09-25 | Discharge: 2021-09-25 | Disposition: A | Discharge status: Home / Self Care | Payer: BC Managed Care – PPO | Source: Ambulatory Visit | Attending: Nurse Practitioner | Admitting: Nurse Practitioner

## 2021-09-25 DIAGNOSIS — R918 Other nonspecific abnormal finding of lung field: Secondary | ICD-10-CM | POA: Diagnosis not present

## 2021-09-27 ENCOUNTER — Telehealth: Payer: Self-pay

## 2021-09-27 NOTE — Telephone Encounter (Signed)
Left vm to confirm 10/03/21 appointment-Toni

## 2021-09-29 ENCOUNTER — Encounter: Payer: Self-pay | Admitting: Nurse Practitioner

## 2021-09-30 ENCOUNTER — Encounter: Payer: Self-pay | Admitting: Nurse Practitioner

## 2021-09-30 DIAGNOSIS — G2581 Restless legs syndrome: Secondary | ICD-10-CM | POA: Insufficient documentation

## 2021-09-30 DIAGNOSIS — R7989 Other specified abnormal findings of blood chemistry: Secondary | ICD-10-CM | POA: Insufficient documentation

## 2021-09-30 DIAGNOSIS — G4733 Obstructive sleep apnea (adult) (pediatric): Secondary | ICD-10-CM | POA: Insufficient documentation

## 2021-10-03 ENCOUNTER — Ambulatory Visit (INDEPENDENT_AMBULATORY_CARE_PROVIDER_SITE_OTHER): Payer: BC Managed Care – PPO

## 2021-10-03 ENCOUNTER — Ambulatory Visit: Payer: BC Managed Care – PPO

## 2021-10-03 DIAGNOSIS — R748 Abnormal levels of other serum enzymes: Secondary | ICD-10-CM | POA: Diagnosis not present

## 2021-10-16 ENCOUNTER — Telehealth: Payer: Self-pay

## 2021-10-16 ENCOUNTER — Ambulatory Visit: Payer: BC Managed Care – PPO | Admitting: Nurse Practitioner

## 2021-10-16 ENCOUNTER — Encounter: Payer: Self-pay | Admitting: Nurse Practitioner

## 2021-10-16 VITALS — BP 129/80 | HR 89 | Temp 98.7°F | Resp 16 | Ht 69.1 in | Wt 189.0 lb

## 2021-10-16 DIAGNOSIS — I77810 Thoracic aortic ectasia: Secondary | ICD-10-CM | POA: Diagnosis not present

## 2021-10-16 DIAGNOSIS — N281 Cyst of kidney, acquired: Secondary | ICD-10-CM

## 2021-10-16 DIAGNOSIS — I10 Essential (primary) hypertension: Secondary | ICD-10-CM

## 2021-10-16 NOTE — Progress Notes (Signed)
[facility] [place], [place] [address]  Internal MEDICINE  Office Visit Note  Patient Name: [name]  [id number]  [id number]  Date of Service: 10/16/2021  Chief Complaint  Patient presents with   Follow-up    Swelling in both legs, left is worse and will get warm to the touch, started almost 2 weeks ago   Hypertension   COPD    HPI Brandonn presents for a follow-up visit for hypertension, lower extremity edema and to review imaging. --Blood pressure still not optimally controlled. --Amlodipine caused increased swelling of lower extremities. Have tried multiple medications at this point Liver is normal per ultrasound results.  Left renal cyst 1.1 cm, wants referral to nephrology     Current Medication: Outpatient Encounter Medications as of 10/16/2021  Medication Sig   albuterol (VENTOLIN HFA) 108 (90 Base) MCG/ACT inhaler Inhale 2 puffs into the lungs every 6 (six) hours as needed for wheezing or shortness of breath.   amLODipine (NORVASC) 10 MG tablet Take 1 tablet (10 mg total) by mouth daily.   bictegravir-emtricitabine-tenofovir AF (BIKTARVY) 50-200-25 MG TABS tablet Take 1 tablet by mouth daily.    cetirizine (ZYRTEC) 10 MG tablet Take 10 mg by mouth daily.   cyclobenzaprine (FLEXERIL) 10 MG tablet TAKE 1 TABLET BY MOUTH TWICE A DAY AS NEEDED FOR MUSCLE SPASMS   escitalopram (LEXAPRO) 20 MG tablet TAKE 1 TAB BY MOUTH DAILY WITH SUPPER (NEED APPT FOR REFILL)   fluticasone (FLONASE) 50 MCG/ACT nasal spray Place 1 spray into both nostrils daily.   lisinopril (ZESTRIL) 10 MG tablet Take 2 tablets (20 mg total) by mouth daily.   mometasone-formoterol (DULERA) 200-5 MCG/ACT AERO Inhale into the lungs.   mupirocin ointment (BACTROBAN) 2 % Apply 1 application. topically 2 (two) times daily.   pramipexole (MIRAPEX) 0.5 MG tablet Take 1 tablet (0.5 mg total) by mouth at bedtime. Take 2-3 hours prior to going to bed.   pregabalin (LYRICA) 75 MG capsule Take 1  capsule (75 mg total) by mouth at bedtime.   [DISCONTINUED] famciclovir (FAMVIR) 500 MG tablet Take 1 tablet by mouth twice daily during a flare up/outbreak until resolved.   No facility-administered encounter medications on file as of 10/16/2021.    Surgical History: Past Surgical History:  Procedure Laterality Date   COLONOSCOPY WITH PROPOFOL N/A 03/22/2019   Procedure: COLONOSCOPY WITH BIOPSIES;  Surgeon: Lucilla Lame, MD;  Location: [place];  Service: Endoscopy;  Laterality: N/A;   I & D EXTREMITY Left 05/21/2019   Procedure: IRRIGATION AND DEBRIDEMENT LEFT LEG;  Surgeon: Herbert Pun, MD;  Location: [facility];  Service: General;  Laterality: Left;   I & D EXTREMITY Left 05/25/2019   Procedure: IRRIGATION AND DEBRIDEMENT Left Leg;  Surgeon: Herbert Pun, MD;  Location: [facility];  Service: General;  Laterality: Left;   POLYPECTOMY N/A 03/22/2019   Procedure: POLYPECTOMY;  Surgeon: Lucilla Lame, MD;  Location: [place];  Service: Endoscopy;  Laterality: N/A;    Medical History: Past Medical History:  Diagnosis Date   Allergy    COPD (chronic obstructive pulmonary disease) ([place])    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   HIV (human immunodeficiency virus infection) ([place])    Hypertension    Sleep apnea     Family History: Family History  Adopted: Yes    Social History   Socioeconomic History   Marital status: Divorced    Spouse name: Not on file   Number  of children: Not on file   Years of education: Not on file   Highest education level: Not on file  Occupational History   Not on file  Tobacco Use   Smoking status: Every Day    Packs/day: 1.50    Years: 30.00    Total pack years: 45.00    Types: Cigarettes   Smokeless tobacco: Never   Tobacco comments:    since age 30  Vaping Use   Vaping Use: Never used  Substance and Sexual Activity   Alcohol use: Yes    Alcohol/week: 21.0 standard drinks of alcohol    Types: 21  Shots of liquor per week    Comment: weekends   Drug use: Never   Sexual activity: Not on file  Other Topics Concern   Not on file  Social History Narrative   Not on file   Social Determinants of Health   Financial Resource Strain: Not on file  Food Insecurity: Not on file  Transportation Needs: Not on file  Physical Activity: Not on file  Stress: Not on file  Social Connections: Not on file  Intimate Partner Violence: Not on file      Review of Systems  Constitutional: Negative.  Negative for chills, fatigue and unexpected weight change.  HENT:  Negative for congestion, rhinorrhea, sneezing and sore throat.   Eyes:  Negative for redness.  Respiratory: Negative.  Negative for cough, chest tightness and shortness of breath.   Cardiovascular: Negative.  Negative for chest pain and palpitations.  Gastrointestinal:  Negative for abdominal pain, constipation, diarrhea, nausea and vomiting.  Genitourinary:  Negative for dysuria and frequency.  Musculoskeletal:  Negative for arthralgias, back pain, joint swelling and neck pain.  Skin:  Negative for rash.  Neurological: Negative.  Negative for tremors and numbness.  Hematological:  Negative for adenopathy. Does not bruise/bleed easily.  Psychiatric/Behavioral:  Negative for behavioral problems (Depression), sleep disturbance and suicidal ideas. The patient is not nervous/anxious.     Vital Signs: BP 129/80   Pulse 89   Temp 98.7 F (37.1 C)   Resp 16   Ht 5' 9.1" (1.755 m)   Wt 189 lb (85.7 kg)   SpO2 97%   BMI 27.83 kg/m    Physical Exam Vitals reviewed.  Constitutional:      General: He is not in acute distress.    Appearance: Normal appearance. He is not ill-appearing.  HENT:     Head: Normocephalic and atraumatic.  Eyes:     Pupils: Pupils are equal, round, and reactive to light.  Cardiovascular:     Rate and Rhythm: Normal rate and regular rhythm.  Pulmonary:     Effort: Pulmonary effort is normal. No  respiratory distress.  Musculoskeletal:        General: Swelling present.  Neurological:     Mental Status: He is alert and oriented to person, place, and time.     Cranial Nerves: No cranial nerve deficit.     Coordination: Coordination normal.     Gait: Gait normal.  Psychiatric:        Mood and Affect: Mood normal.        Behavior: Behavior normal.        Assessment/Plan: 1. Essential hypertension Refer to cardiology for further evaluation of BP and med adjustment - Ambulatory referral to Cardiology  2. Thoracic aortic ectasia (HCC) CTA ordered for further evaluation of aneurysm.  - CT ANGIO CHEST AORTA W/CM & OR WO/CM; Future - BMP8+EGFR  3. Renal cyst, left Referred to nephrology per patient request.  - Ambulatory referral to Nephrology   General Counseling: Marylou Mccoy understanding of the findings of todays visit and agrees with plan of treatment. I have discussed any further diagnostic evaluation that may be needed or ordered today. We also reviewed his medications today. he has been encouraged to call the office with any questions or concerns that should arise related to todays visit.    Orders Placed This Encounter  Procedures   CT ANGIO CHEST AORTA W/CM & OR WO/CM   BMP8+EGFR   Ambulatory referral to Cardiology   Ambulatory referral to Nephrology    No orders of the defined types were placed in this encounter.   Return in about 3 months (around 01/16/2022) for F/U, Iliyana Convey PCP.   Total time spent:30 Minutes Time spent includes review of chart, medications, test results, and follow up plan with the patient.   [place] Controlled Substance Database was reviewed by me.  This patient was seen by Jonetta Osgood, FNP-C in collaboration with Dr. Clayborn Bigness as a part of collaborative care agreement.   Lari Linson R. Valetta Fuller, MSN, FNP-C Internal medicine

## 2021-10-17 ENCOUNTER — Telehealth: Payer: Self-pay

## 2021-10-17 NOTE — Telephone Encounter (Signed)
Left patient another vm and sent mychart message regarding CT and lab work-Toni

## 2021-10-19 ENCOUNTER — Ambulatory Visit: Payer: BC Managed Care – PPO

## 2021-10-19 DIAGNOSIS — I77810 Thoracic aortic ectasia: Secondary | ICD-10-CM | POA: Diagnosis not present

## 2021-10-20 LAB — BMP8+EGFR
BUN/Creatinine Ratio: 13 (ref 9–20)
BUN: 13 mg/dL (ref 6–24)
CO2: 21 mmol/L (ref 20–29)
Calcium: 9.1 mg/dL (ref 8.7–10.2)
Chloride: 98 mmol/L (ref 96–106)
Creatinine, Ser: 0.99 mg/dL (ref 0.76–1.27)
Glucose: 90 mg/dL (ref 70–99)
Potassium: 4.4 mmol/L (ref 3.5–5.2)
Sodium: 136 mmol/L (ref 134–144)
eGFR: 91 mL/min/{1.73_m2} (ref >59)

## 2021-10-24 ENCOUNTER — Ambulatory Visit
Admission: RE | Admit: 2021-10-24 | Discharge: 2021-10-24 | Disposition: A | Discharge status: Home / Self Care | Payer: BC Managed Care – PPO | Source: Ambulatory Visit | Attending: Nurse Practitioner | Admitting: Nurse Practitioner

## 2021-10-24 DIAGNOSIS — I700 Atherosclerosis of aorta: Secondary | ICD-10-CM | POA: Diagnosis not present

## 2021-10-24 DIAGNOSIS — I77810 Thoracic aortic ectasia: Secondary | ICD-10-CM | POA: Diagnosis not present

## 2021-10-24 DIAGNOSIS — I712 Thoracic aortic aneurysm, without rupture, unspecified: Secondary | ICD-10-CM | POA: Diagnosis not present

## 2021-10-24 DIAGNOSIS — I2510 Atherosclerotic heart disease of native coronary artery without angina pectoris: Secondary | ICD-10-CM | POA: Diagnosis not present

## 2021-10-24 MED ORDER — IOHEXOL 350 MG/ML SOLN
75.0000 mL | Freq: Once | INTRAVENOUS | Status: AC | PRN
  Administered 2021-10-24: 75 mL via INTRAVENOUS

## 2021-10-25 ENCOUNTER — Telehealth: Payer: Self-pay

## 2021-10-25 NOTE — Telephone Encounter (Signed)
Per [facility], they have left 2 vms for patient to return their call. I spoke with patient this morning, he will call them today to schedule appointment-Toni

## 2021-11-06 ENCOUNTER — Telehealth: Payer: Self-pay

## 2021-11-06 NOTE — Telephone Encounter (Signed)
Cardiology appointment 11/08/21 @ 11:30 with [facility]-Toni

## 2021-11-07 DIAGNOSIS — R7989 Other specified abnormal findings of blood chemistry: Secondary | ICD-10-CM | POA: Diagnosis not present

## 2021-11-07 DIAGNOSIS — B20 Human immunodeficiency virus [HIV] disease: Secondary | ICD-10-CM | POA: Diagnosis not present

## 2021-11-08 DIAGNOSIS — I10 Essential (primary) hypertension: Secondary | ICD-10-CM | POA: Diagnosis not present

## 2021-11-08 DIAGNOSIS — R0789 Other chest pain: Secondary | ICD-10-CM | POA: Diagnosis not present

## 2021-11-14 DIAGNOSIS — I10 Essential (primary) hypertension: Secondary | ICD-10-CM | POA: Diagnosis not present

## 2021-11-14 DIAGNOSIS — E781 Pure hyperglyceridemia: Secondary | ICD-10-CM | POA: Diagnosis not present

## 2021-11-14 DIAGNOSIS — B20 Human immunodeficiency virus [HIV] disease: Secondary | ICD-10-CM | POA: Diagnosis not present

## 2021-11-14 DIAGNOSIS — L989 Disorder of the skin and subcutaneous tissue, unspecified: Secondary | ICD-10-CM | POA: Diagnosis not present

## 2021-12-02 ENCOUNTER — Other Ambulatory Visit: Payer: Self-pay | Admitting: Nurse Practitioner

## 2021-12-02 DIAGNOSIS — B009 Herpesviral infection, unspecified: Secondary | ICD-10-CM

## 2021-12-03 ENCOUNTER — Encounter: Payer: Self-pay | Admitting: Nurse Practitioner

## 2021-12-06 ENCOUNTER — Ambulatory Visit (INDEPENDENT_AMBULATORY_CARE_PROVIDER_SITE_OTHER): Payer: BC Managed Care – PPO

## 2021-12-06 ENCOUNTER — Ambulatory Visit
Admission: EM | Admit: 2021-12-06 | Discharge: 2021-12-06 | Disposition: A | Discharge status: Home / Self Care | Payer: BC Managed Care – PPO | Attending: Emergency Medicine | Admitting: Emergency Medicine

## 2021-12-06 DIAGNOSIS — S61212A Laceration without foreign body of right middle finger without damage to nail, initial encounter: Secondary | ICD-10-CM

## 2021-12-06 DIAGNOSIS — S61210A Laceration without foreign body of right index finger without damage to nail, initial encounter: Secondary | ICD-10-CM

## 2021-12-06 MED ORDER — TETANUS-DIPHTH-ACELL PERTUSSIS 5-2.5-18.5 LF-MCG/0.5 IM SUSY: 0.5000 mL | PREFILLED_SYRINGE | Freq: Once | INTRAMUSCULAR | Status: DC

## 2021-12-06 MED ORDER — CEPHALEXIN 500 MG PO CAPS: 500.0000 mg | ORAL_CAPSULE | Freq: Three times a day (TID) | ORAL | 0 refills | Status: AC

## 2021-12-06 NOTE — Discharge Instructions (Addendum)
Leave the dressing in place for the next 24 hours.  After 24 hours remove the dressing, wash the wound with warm water and soap, pat it dry, and apply a thin smear of bacitracin.  Clean the wound daily and apply bacitracin for the first 2 days.  After that a scab should have started to form and you can leave the wound open to air when you are at home and cover with a Band-Aid when you go out in public.  Take the Keflex three times daily for 5 days for prevention of wound infection.  Sutures remain in place for 10 days, please return here or see your primary care provider for removal.  If you develop any redness at the wound site, swelling, pain, drainage, red streaks going up your hand, or fever please return for reevaluation.

## 2021-12-06 NOTE — ED Provider Notes (Signed)
[facility]    CSN: [id number] Arrival date & time: 12/06/21  1942      History   Chief Complaint Chief Complaint  Patient presents with   Laceration    HPI [name] is a 55 y.o. male.   HPI  56 year old male here for evaluation of laceration to his right index and middle finger.  Patient reports that he was working on a [employer] when his finger got caught between the guard and the shaft sustaining lacerations to the distal aspect of his right second and third finger.  He denies any numbness tingling his fingers and he is full range of motion.  He thinks his last tetanus shot was in the last 5 years.  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   HIV (human immunodeficiency virus infection) ([place])    Hypertension    Sleep apnea     Patient Active Problem List   Diagnosis Date Noted   Obstructive sleep apnea 09/30/2021   Restless leg syndrome 09/30/2021   Elevated ferritin level 09/30/2021   Cellulitis 05/19/2019   Hypokalemia 05/19/2019   Polyp of transverse colon    Trochanteric bursitis of left hip 03/11/2019   Osteoarthritis of knee 03/11/2019   Cigarette nicotine dependence without complication 19/41/7408   Chronic insomnia 11/04/2018   Essential hypertension 03/24/2018   HIV (human immunodeficiency virus infection) ([place]) 03/03/2018   Acute bronchitis with COPD ([place]) 03/03/2018    Past Surgical History:  Procedure Laterality Date   COLONOSCOPY WITH PROPOFOL N/A 03/22/2019   Procedure: COLONOSCOPY WITH BIOPSIES;  Surgeon: Lucilla Lame, MD;  Location: [place];  Service: Endoscopy;  Laterality: N/A;   I & D EXTREMITY Left 05/21/2019   Procedure: IRRIGATION AND DEBRIDEMENT LEFT LEG;  Surgeon: Herbert Pun, MD;  Location: [facility];  Service: General;  Laterality: Left;   I & D EXTREMITY Left 05/25/2019   Procedure:  IRRIGATION AND DEBRIDEMENT Left Leg;  Surgeon: Herbert Pun, MD;  Location: [facility];  Service: General;  Laterality: Left;   POLYPECTOMY N/A 03/22/2019   Procedure: POLYPECTOMY;  Surgeon: Lucilla Lame, MD;  Location: [place];  Service: Endoscopy;  Laterality: N/A;       Home Medications    Prior to Admission medications   Medication Sig Start Date End Date Taking? Authorizing Provider  cephALEXin (KEFLEX) 500 MG capsule Take 1 capsule (500 mg total) by mouth 3 (three) times daily for 5 days. 12/06/21 12/11/21 Yes Margarette Canada, NP  albuterol (VENTOLIN HFA) 108 (90 Base) MCG/ACT inhaler Inhale 2 puffs into the lungs every 6 (six) hours as needed for wheezing or shortness of breath. 01/15/21   Jonetta Osgood, NP  amLODipine (NORVASC) 10 MG tablet Take 1 tablet (10 mg total) by mouth daily. 09/04/21   Jonetta Osgood, NP  bictegravir-emtricitabine-tenofovir AF (BIKTARVY) 50-200-25 MG TABS tablet Take 1 tablet by mouth daily.  03/24/18   [provider]  cetirizine (ZYRTEC) 10 MG tablet Take 10 mg by mouth daily.    [provider]  cyclobenzaprine (FLEXERIL) 10 MG tablet TAKE 1 TABLET BY MOUTH TWICE A DAY AS NEEDED FOR MUSCLE SPASMS 07/10/21   Abernathy, Yetta Flock, NP  escitalopram (LEXAPRO) 20 MG tablet TAKE 1 TAB BY MOUTH DAILY WITH SUPPER (NEED APPT FOR REFILL) 06/15/21   Jonetta Osgood, NP  famciclovir (FAMVIR) 500 MG tablet TAKE 1  TABLET BY MOUTH TWICE DAILY DURING A FLARE UP/OUTBREAK UNTIL RESOLVED. 12/02/21   Abernathy, Yetta Flock, NP  fluticasone (FLONASE) 50 MCG/ACT nasal spray Place 1 spray into both nostrils daily.    [provider]  lisinopril (ZESTRIL) 10 MG tablet Take 2 tablets (20 mg total) by mouth daily. 08/16/21   Jonetta Osgood, NP  mometasone-formoterol (DULERA) 200-5 MCG/ACT AERO Inhale into the lungs.    [provider]  mupirocin ointment (BACTROBAN) 2 % Apply 1 application. topically 2 (two) times daily. 09/04/21    Jonetta Osgood, NP  pramipexole (MIRAPEX) 0.5 MG tablet Take 1 tablet (0.5 mg total) by mouth at bedtime. Take 2-3 hours prior to going to bed. 08/16/21   Jonetta Osgood, NP  pregabalin (LYRICA) 75 MG capsule Take 1 capsule (75 mg total) by mouth at bedtime. 07/04/21   Jonetta Osgood, NP    Family History Family History  Adopted: Yes    Social History Social History   Tobacco Use   Smoking status: Every Day    Packs/day: 1.50    Years: 30.00    Total pack years: 45.00    Types: Cigarettes   Smokeless tobacco: Never   Tobacco comments:    since age 55  Vaping Use   Vaping Use: Never used  Substance Use Topics   Alcohol use: Yes    Alcohol/week: 21.0 standard drinks of alcohol    Types: 21 Shots of liquor per week    Comment: weekends   Drug use: Never     Allergies   Other   Review of Systems Review of Systems  Musculoskeletal:  Positive for arthralgias. Negative for joint swelling.  Skin:  Positive for color change and wound.  Neurological:  Negative for weakness and numbness.  Hematological: Negative.   Psychiatric/Behavioral: Negative.       Physical Exam Triage Vital Signs ED Triage Vitals  Enc Vitals Group     BP 12/06/21 1956 (!) 149/90     Pulse Rate 12/06/21 1956 77     Resp --      Temp 12/06/21 1956 98.3 F (36.8 C)     Temp Source 12/06/21 1956 Oral     SpO2 12/06/21 1956 100 %     Weight 12/06/21 1955 178 lb (80.7 kg)     Height 12/06/21 1955 5' 9.5" (1.765 m)     Head Circumference --      Peak Flow --      Pain Score 12/06/21 1955 2     Pain Loc --      Pain Edu? --      Excl. in [place]? --    No data found.  Updated Vital Signs BP (!) 149/90   Pulse 77   Temp 98.3 F (36.8 C) (Oral)   Ht 5' 9.5" (1.765 m)   Wt 178 lb (80.7 kg)   SpO2 100%   BMI 25.91 kg/m   Visual Acuity Right Eye Distance:   Left Eye Distance:   Bilateral Distance:    Right Eye Near:   Left Eye Near:    Bilateral Near:     Physical Exam Vitals  and nursing note reviewed.  Constitutional:      Appearance: Normal appearance. He is not ill-appearing.  HENT:     Head: Normocephalic and atraumatic.  Musculoskeletal:        General: Tenderness and signs of injury present. No swelling. Normal range of motion.  Skin:    General: Skin is warm and dry.  Capillary Refill: Capillary refill takes less than 2 seconds.     Findings: No bruising or erythema.  Neurological:     General: No focal deficit present.     Mental Status: He is alert and oriented to person, place, and time.  Psychiatric:        Mood and Affect: Mood normal.        Behavior: Behavior normal.        Thought Content: Thought content normal.        Judgment: Judgment normal.      UC Treatments / Results  Labs (all labs ordered are listed, but only abnormal results are displayed) Labs Reviewed - No data to display  EKG   Radiology No results found.  Procedures Procedures (including critical care time)  Medications Ordered in UC Medications - No data to display   Initial Impression / Assessment and Plan / UC Course  I have reviewed the triage vital signs and the nursing notes.  Pertinent labs & imaging results that were available during my care of the patient were reviewed by me and considered in my medical decision making (see chart for details).   Patient is a pleasant, nontoxic-appearing 56 year old male here for evaluation of lacerations to the middle phalanx of the right second and third finger.  On the third finger there is a chevron shaped laceration and on the index finger there is a U-shaped laceration.  The skin flaps are well approximated.  There are some venous oozing from both fingers.  Patient has full range of motion sensation in his fingers.  I will obtain radiograph to rule out underlying fracture and then repair the patient's lacerations with sutures and discharge patient home on Keflex.  He is refusing a tetanus shot at this time and  states if he is not up-to-date he will get it when he returns for suture removal.  X-ray of right index and middle finger independent reviewed and evaluated by me.  No evidence of fracture or dislocation.  Radiology overread is pending. Radiology findings state there is no fracture or dislocation of either the index or middle finger.  There is some soft tissue gas in the middle finger compatible with history of laceration.  Both lacerations were anesthetized using local infiltration of 3 mils of 1% lidocaine without epi.  Good anesthesia was achieved.  The wounds were cleansed with chlorhexidine and saline, the wound ups were explored for the presence of foreign bodies or tendon injury, neither were present, and both wounds were closed using 3 simple interrupted sutures each of 4-0 Prolene.  The wounds were then cleansed again, a thin smear of bacitracin was applied, and they were dressed with nonadherent dressing and Coban.  Patient was discharged home on Keflex with return precautions and suture removal precautions.   Final Clinical Impressions(s) / UC Diagnoses   Final diagnoses:  Laceration of right index finger without foreign body without damage to nail, initial encounter  Laceration of right middle finger without foreign body without damage to nail, initial encounter     Discharge Instructions      Leave the dressing in place for the next 24 hours.  After 24 hours remove the dressing, wash the wound with warm water and soap, pat it dry, and apply a thin smear of bacitracin.  Clean the wound daily and apply bacitracin for the first 2 days.  After that a scab should have started to form and you can leave the wound open to air  when you are at home and cover with a Band-Aid when you go out in public.  Take the Keflex three times daily for 5 days for prevention of wound infection.  Sutures remain in place for 10 days, please return here or see your primary care provider for removal.  If  you develop any redness at the wound site, swelling, pain, drainage, red streaks going up your hand, or fever please return for reevaluation.      ED Prescriptions     Medication Sig Dispense Auth. Provider   cephALEXin (KEFLEX) 500 MG capsule Take 1 capsule (500 mg total) by mouth 3 (three) times daily for 5 days. 15 capsule Margarette Canada, NP      PDMP not reviewed this encounter.   Margarette Canada, NP 12/06/21 2048

## 2021-12-06 NOTE — ED Triage Notes (Signed)
Patient presents to UC for a work injury.   Patient reports that his left index and middle finger got stuck between a shaft at work.   There is a laceration on both of this fingers.

## 2021-12-07 ENCOUNTER — Telehealth: Payer: Self-pay | Admitting: Physician Assistant

## 2021-12-07 NOTE — Telephone Encounter (Signed)
Patient presented to clinic today for a note for work.  Patient seen yesterday for work-related injury.  Sustained laceration to index finger and middle finger of right hand.  Seen by Margarette Canada, NP.  Patient has been working and using the right hand with very tight Band-Aids today.  I reviewed Jeremy's note on the patient.  No information about work restrictions.  I was able to evaluate the patient's hand today.  He does have lacerations over the DIP joints of the index finger and middle finger.  Have advised him that it is best if he did not use affected hand until the sutures are out or he may rip his sutures and have open wounds that will take longer to heal and can potentially get infected.  Patient wants to use his right hand but I have advised him that is not a good idea.  He also has macerated tissue on exam today.  States he has been sweating a lot and has not been able to change the bandage.  I have advised him that the bandages are too tight and he needs to use nonadherent pads and Coban placed a little looser.  Nursing staff rebandaged patient.  Patient given contact information to follow-up with work comp for evaluation again next week.

## 2021-12-15 ENCOUNTER — Other Ambulatory Visit: Payer: Self-pay | Admitting: Internal Medicine

## 2021-12-15 ENCOUNTER — Other Ambulatory Visit: Payer: Self-pay | Admitting: Nurse Practitioner

## 2021-12-15 DIAGNOSIS — I10 Essential (primary) hypertension: Secondary | ICD-10-CM

## 2021-12-27 ENCOUNTER — Other Ambulatory Visit: Payer: Self-pay | Admitting: Nurse Practitioner

## 2022-01-13 ENCOUNTER — Other Ambulatory Visit: Payer: Self-pay | Admitting: Nurse Practitioner

## 2022-01-13 DIAGNOSIS — G2581 Restless legs syndrome: Secondary | ICD-10-CM

## 2022-01-15 ENCOUNTER — Encounter: Payer: Self-pay | Admitting: Nurse Practitioner

## 2022-01-15 ENCOUNTER — Ambulatory Visit: Payer: BC Managed Care – PPO | Admitting: Nurse Practitioner

## 2022-01-15 VITALS — BP 130/78 | HR 85 | Temp 97.2°F | Resp 16 | Ht 69.1 in | Wt 170.0 lb

## 2022-01-15 DIAGNOSIS — I10 Essential (primary) hypertension: Secondary | ICD-10-CM

## 2022-01-15 DIAGNOSIS — G9339 Other post infection and related fatigue syndromes: Secondary | ICD-10-CM | POA: Diagnosis not present

## 2022-01-15 DIAGNOSIS — B351 Tinea unguium: Secondary | ICD-10-CM

## 2022-01-15 DIAGNOSIS — M25561 Pain in right knee: Secondary | ICD-10-CM | POA: Diagnosis not present

## 2022-01-15 DIAGNOSIS — R748 Abnormal levels of other serum enzymes: Secondary | ICD-10-CM

## 2022-01-15 DIAGNOSIS — M170 Bilateral primary osteoarthritis of knee: Secondary | ICD-10-CM

## 2022-01-15 DIAGNOSIS — M25562 Pain in left knee: Secondary | ICD-10-CM

## 2022-01-15 DIAGNOSIS — G8929 Other chronic pain: Secondary | ICD-10-CM

## 2022-01-15 MED ORDER — TERBINAFINE HCL 250 MG PO TABS: 250.0000 mg | ORAL_TABLET | Freq: Every day | ORAL | 0 refills | Status: DC

## 2022-01-15 MED ORDER — LISINOPRIL-HYDROCHLOROTHIAZIDE 10-12.5 MG PO TABS: 1.0000 | ORAL_TABLET | Freq: Every day | ORAL | 2 refills | Status: DC

## 2022-01-15 NOTE — Progress Notes (Signed)
[facility] [place], [place] [address]  Internal MEDICINE  Office Visit Note  Patient Name: [name]  [id number]  [id number]  Date of Service: 01/15/2022  Chief Complaint  Patient presents with   Follow-up    Follow up CT    HPI [name] presents for hypertension, insomnia, onychomycosis, chronic bilateral knee pain and review CTA.  Hypertension --having swelling, needs diuretic Insomnia -- sleeping much better with pramipexole for RLS.  Onychomycosis --wants to get it treated Chronic bilateral knee pain and arthritis CTA --aneurysm is stable, still needs monitoring --Laceration of fingers, resolved, healed.     Current Medication: Outpatient Encounter Medications as of 01/15/2022  Medication Sig   albuterol (VENTOLIN HFA) 108 (90 Base) MCG/ACT inhaler Inhale 2 puffs into the lungs every 6 (six) hours as needed for wheezing or shortness of breath.   amLODipine (NORVASC) 10 MG tablet TAKE 1 TABLET BY MOUTH EVERY DAY   bictegravir-emtricitabine-tenofovir AF (BIKTARVY) 50-200-25 MG TABS tablet Take 1 tablet by mouth daily.    cetirizine (ZYRTEC) 10 MG tablet Take 10 mg by mouth daily.   cyclobenzaprine (FLEXERIL) 10 MG tablet TAKE 1 TABLET BY MOUTH TWICE A DAY AS NEEDED FOR MUSCLE SPASM   escitalopram (LEXAPRO) 20 MG tablet TAKE 1 TAB BY MOUTH DAILY WITH SUPPER (NEED APPT FOR REFILL)   famciclovir (FAMVIR) 500 MG tablet TAKE 1 TABLET BY MOUTH TWICE DAILY DURING A FLARE UP/OUTBREAK UNTIL RESOLVED.   fluticasone (FLONASE) 50 MCG/ACT nasal spray Place 1 spray into both nostrils daily.   lisinopril-hydrochlorothiazide (ZESTORETIC) 10-12.5 MG tablet Take 1 tablet by mouth daily.   mometasone-formoterol (DULERA) 200-5 MCG/ACT AERO Inhale into the lungs.   mupirocin ointment (BACTROBAN) 2 % Apply 1 application. topically 2 (two) times daily.   pramipexole (MIRAPEX) 0.5 MG tablet TAKE 1 TABLET (0.5 MG TOTAL) BY MOUTH AT BEDTIME. TAKE 2-3 HOURS PRIOR TO GOING TO  BED.   terbinafine (LAMISIL) 250 MG tablet Take 1 tablet (250 mg total) by mouth daily. For 12 weeks   [DISCONTINUED] lisinopril (ZESTRIL) 10 MG tablet TAKE 1 TABLET BY MOUTH EVERY DAY   [DISCONTINUED] pregabalin (LYRICA) 75 MG capsule Take 1 capsule (75 mg total) by mouth at bedtime.   No facility-administered encounter medications on file as of 01/15/2022.    Surgical History: Past Surgical History:  Procedure Laterality Date   COLONOSCOPY WITH PROPOFOL N/A 03/22/2019   Procedure: COLONOSCOPY WITH BIOPSIES;  Surgeon: Lucilla Lame, MD;  Location: [place];  Service: Endoscopy;  Laterality: N/A;   I & D EXTREMITY Left 05/21/2019   Procedure: IRRIGATION AND DEBRIDEMENT LEFT LEG;  Surgeon: Herbert Pun, MD;  Location: [facility];  Service: General;  Laterality: Left;   I & D EXTREMITY Left 05/25/2019   Procedure: IRRIGATION AND DEBRIDEMENT Left Leg;  Surgeon: Herbert Pun, MD;  Location: [facility];  Service: General;  Laterality: Left;   POLYPECTOMY N/A 03/22/2019   Procedure: POLYPECTOMY;  Surgeon: Lucilla Lame, MD;  Location: [place];  Service: Endoscopy;  Laterality: N/A;    Medical History: Past Medical History:  Diagnosis Date   Allergy    COPD (chronic obstructive pulmonary disease) ([place])    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   HIV (human immunodeficiency virus infection) ([place])    Hypertension    Sleep apnea     Family History: Family History  Adopted: Yes    Social History   Socioeconomic History   Marital status: Divorced  Spouse name: Not on file   Number of children: Not on file   Years of education: Not on file   Highest education level: Not on file  Occupational History   Not on file  Tobacco Use   Smoking status: Every Day    Packs/day: 1.50    Years: 30.00    Total pack years: 45.00    Types: Cigarettes   Smokeless tobacco: Never   Tobacco comments:    since age 89  Vaping Use   Vaping Use: Never used   Substance and Sexual Activity   Alcohol use: Yes    Alcohol/week: 21.0 standard drinks of alcohol    Types: 21 Shots of liquor per week    Comment: weekends   Drug use: Never   Sexual activity: Not on file  Other Topics Concern   Not on file  Social History Narrative   Not on file   Social Determinants of Health   Financial Resource Strain: Not on file  Food Insecurity: Not on file  Transportation Needs: Not on file  Physical Activity: Not on file  Stress: Not on file  Social Connections: Not on file  Intimate Partner Violence: Not on file      Review of Systems  Constitutional: Negative.  Negative for chills, fatigue and unexpected weight change.  HENT:  Negative for congestion, rhinorrhea, sneezing and sore throat.   Eyes:  Negative for redness.  Respiratory: Negative.  Negative for cough, chest tightness, shortness of breath and wheezing.   Cardiovascular: Negative.  Negative for chest pain and palpitations.  Gastrointestinal:  Negative for abdominal pain, constipation, diarrhea, nausea and vomiting.  Genitourinary:  Negative for dysuria and frequency.  Musculoskeletal:  Positive for arthralgias and joint swelling. Negative for back pain and neck pain.  Skin:  Negative for rash.  Neurological: Negative.  Negative for tremors and numbness.  Hematological:  Negative for adenopathy. Does not bruise/bleed easily.  Psychiatric/Behavioral:  Negative for behavioral problems (Depression), self-injury, sleep disturbance and suicidal ideas. The patient is not nervous/anxious.     Vital Signs: BP 130/78   Pulse 85   Temp (!) 97.2 F (36.2 C)   Resp 16   Ht 5' 9.1" (1.755 m)   Wt 170 lb (77.1 kg)   SpO2 98%   BMI 25.03 kg/m    Physical Exam Vitals reviewed.  Constitutional:      General: He is not in acute distress.    Appearance: Normal appearance. He is normal weight. He is not ill-appearing.  HENT:     Head: Normocephalic and atraumatic.  Eyes:     Pupils:  Pupils are equal, round, and reactive to light.  Cardiovascular:     Rate and Rhythm: Normal rate and regular rhythm.  Pulmonary:     Effort: Pulmonary effort is normal. No respiratory distress.  Neurological:     Mental Status: He is alert and oriented to person, place, and time.  Psychiatric:        Mood and Affect: Mood normal.        Behavior: Behavior normal.        Assessment/Plan: 1. Essential hypertension Added HCTZ to lisinopril to decrease swelling - lisinopril-hydrochlorothiazide (ZESTORETIC) 10-12.5 MG tablet; Take 1 tablet by mouth daily.  Dispense: 30 tablet; Refill: 2  2. Onychomycosis of toenail Has liver function checked often and labs are good, terbinafine prescribed.  - terbinafine (LAMISIL) 250 MG tablet; Take 1 tablet (250 mg total) by mouth daily. For 12 weeks  Dispense: 90 tablet; Refill: 0  3. Other post infection and related fatigue syndromes Check hormone levels.  - Estradiol - Testosterone,Free and Total  4. Bilateral chronic knee pain CT scan of both knees as requested by patient - CT KNEE LEFT WO CONTRAST; Future - CT KNEE RIGHT WO CONTRAST; Future  5. Primary osteoarthritis of both knees CT scan of both knees as requested by patient - CT KNEE LEFT WO CONTRAST; Future - CT KNEE RIGHT WO CONTRAST; Future   General Counseling: [name] understanding of the findings of todays visit and agrees with plan of treatment. I have discussed any further diagnostic evaluation that may be needed or ordered today. We also reviewed his medications today. he has been encouraged to call the office with any questions or concerns that should arise related to todays visit.    Orders Placed This Encounter  Procedures   Estradiol   Testosterone,Free and Total    Meds ordered this encounter  Medications   lisinopril-hydrochlorothiazide (ZESTORETIC) 10-12.5 MG tablet    Sig: Take 1 tablet by mouth daily.    Dispense:  30 tablet    Refill:  2    New  medication, please discontinue lisinopril 10 mg, and fill new script today.   terbinafine (LAMISIL) 250 MG tablet    Sig: Take 1 tablet (250 mg total) by mouth daily. For 12 weeks    Dispense:  90 tablet    Refill:  0    12 week course for onychomycosis    Return in about 3 months (around 04/16/2022) for F/U, [name] PCP.   Total time spent:30 Minutes Time spent includes review of chart, medications, test results, and follow up plan with the patient.   [place] Controlled Substance Database was reviewed by me.  This patient was seen by Justin Osgood, FNP-C in collaboration with Dr. Clayborn Bigness as a part of collaborative care agreement.   [name] R. Valetta Fuller, MSN, FNP-C Internal medicine

## 2022-01-16 ENCOUNTER — Encounter: Payer: Self-pay | Admitting: Nurse Practitioner

## 2022-01-24 DIAGNOSIS — G9339 Other post infection and related fatigue syndromes: Secondary | ICD-10-CM | POA: Diagnosis not present

## 2022-01-24 DIAGNOSIS — E291 Testicular hypofunction: Secondary | ICD-10-CM | POA: Diagnosis not present

## 2022-01-30 LAB — ESTRADIOL: Estradiol: 23.5 pg/mL (ref 7.6–42.6)

## 2022-01-30 LAB — TESTOSTERONE,FREE AND TOTAL
Testosterone, Free: 3.5 pg/mL — ABNORMAL LOW (ref 7.2–24.0)
Testosterone: 178 ng/dL — ABNORMAL LOW (ref 264–916)

## 2022-02-05 ENCOUNTER — Other Ambulatory Visit: Payer: Self-pay | Admitting: Nurse Practitioner

## 2022-02-15 ENCOUNTER — Other Ambulatory Visit: Payer: Self-pay | Admitting: Nurse Practitioner

## 2022-02-20 ENCOUNTER — Telehealth: Payer: Self-pay | Admitting: Nurse Practitioner

## 2022-02-21 NOTE — Telephone Encounter (Signed)
Error

## 2022-02-22 ENCOUNTER — Encounter: Payer: Self-pay | Admitting: Nurse Practitioner

## 2022-03-01 ENCOUNTER — Ambulatory Visit
Admission: RE | Admit: 2022-03-01 | Discharge: 2022-03-01 | Disposition: A | Discharge status: Home / Self Care | Payer: BC Managed Care – PPO | Source: Ambulatory Visit | Attending: Nurse Practitioner | Admitting: Nurse Practitioner

## 2022-03-01 DIAGNOSIS — M25561 Pain in right knee: Secondary | ICD-10-CM | POA: Diagnosis not present

## 2022-03-01 DIAGNOSIS — M25562 Pain in left knee: Secondary | ICD-10-CM | POA: Diagnosis not present

## 2022-03-01 DIAGNOSIS — G8929 Other chronic pain: Secondary | ICD-10-CM | POA: Diagnosis not present

## 2022-03-01 DIAGNOSIS — M170 Bilateral primary osteoarthritis of knee: Secondary | ICD-10-CM | POA: Diagnosis not present

## 2022-03-02 ENCOUNTER — Encounter: Payer: Self-pay | Admitting: Nurse Practitioner

## 2022-03-05 ENCOUNTER — Other Ambulatory Visit (HOSPITAL_COMMUNITY)
Admission: RE | Admit: 2022-03-05 | Discharge: 2022-03-05 | Disposition: A | Discharge status: Home / Self Care | Payer: BC Managed Care – PPO | Source: Ambulatory Visit | Attending: Family | Admitting: Family

## 2022-03-05 ENCOUNTER — Other Ambulatory Visit: Payer: Self-pay

## 2022-03-05 ENCOUNTER — Other Ambulatory Visit: Payer: BC Managed Care – PPO

## 2022-03-05 DIAGNOSIS — B20 Human immunodeficiency virus [HIV] disease: Secondary | ICD-10-CM | POA: Insufficient documentation

## 2022-03-05 DIAGNOSIS — Z113 Encounter for screening for infections with a predominantly sexual mode of transmission: Secondary | ICD-10-CM

## 2022-03-05 LAB — URINALYSIS
Bilirubin Urine: NEGATIVE
Glucose, UA: NEGATIVE
Hgb urine dipstick: NEGATIVE
Ketones, ur: NEGATIVE
Leukocytes,Ua: NEGATIVE
Nitrite: NEGATIVE
Protein, ur: NEGATIVE
Specific Gravity, Urine: 1.007 (ref 1.001–1.035)
pH: 7 (ref 5.0–8.0)

## 2022-03-06 LAB — T-HELPER CELL (CD4) - (RCID CLINIC ONLY)
CD4 % Helper T Cell: 43 % (ref 33–65)
CD4 T Cell Abs: 768 /uL (ref 400–1790)

## 2022-03-06 LAB — URINE CYTOLOGY ANCILLARY ONLY
Chlamydia: NEGATIVE
Comment: NEGATIVE
Comment: NEGATIVE
Comment: NORMAL
Neisseria Gonorrhea: NEGATIVE
Trichomonas: NEGATIVE

## 2022-03-11 LAB — QUANTIFERON-TB GOLD PLUS
Mitogen-NIL: >10 IU/mL
NIL: 0.07 IU/mL
QuantiFERON-TB Gold Plus: NEGATIVE
TB1-NIL: 0 IU/mL
TB2-NIL: <0 IU/mL

## 2022-03-11 LAB — HEPATITIS A ANTIBODY, TOTAL: Hepatitis A AB,Total: REACTIVE — AB

## 2022-03-11 LAB — CBC WITH DIFFERENTIAL/PLATELET
Absolute Monocytes: 524 cells/uL (ref 200–950)
Basophils Absolute: 104 cells/uL (ref 0–200)
Basophils Relative: 1.5 %
Eosinophils Absolute: 242 cells/uL (ref 15–500)
Eosinophils Relative: 3.5 %
HCT: 48.1 % (ref 38.5–50.0)
Hemoglobin: 16.9 g/dL (ref 13.2–17.1)
Lymphs Abs: 2056 cells/uL (ref 850–3900)
MCH: 35 pg — ABNORMAL HIGH (ref 27.0–33.0)
MCHC: 35.1 g/dL (ref 32.0–36.0)
MCV: 99.6 fL (ref 80.0–100.0)
MPV: 10.2 fL (ref 7.5–12.5)
Monocytes Relative: 7.6 %
Neutro Abs: 3974 cells/uL (ref 1500–7800)
Neutrophils Relative %: 57.6 %
Platelets: 264 10*3/uL (ref 140–400)
RBC: 4.83 10*6/uL (ref 4.20–5.80)
RDW: 13.9 % (ref 11.0–15.0)
Total Lymphocyte: 29.8 %
WBC: 6.9 10*3/uL (ref 3.8–10.8)

## 2022-03-11 LAB — HIV ANTIBODY (ROUTINE TESTING W REFLEX): HIV 1&2 Ab, 4th Generation: REACTIVE — AB

## 2022-03-11 LAB — COMPLETE METABOLIC PANEL WITH GFR
AG Ratio: 1.6 (calc) (ref 1.0–2.5)
ALT: 21 U/L (ref 9–46)
AST: 32 U/L (ref 10–35)
Albumin: 4.3 g/dL (ref 3.6–5.1)
Alkaline phosphatase (APISO): 79 U/L (ref 35–144)
BUN: 13 mg/dL (ref 7–25)
CO2: 31 mmol/L (ref 20–32)
Calcium: 8.9 mg/dL (ref 8.6–10.3)
Chloride: 100 mmol/L (ref 98–110)
Creat: 0.87 mg/dL (ref 0.70–1.30)
Globulin: 2.7 g/dL (calc) (ref 1.9–3.7)
Glucose, Bld: 79 mg/dL (ref 65–99)
Potassium: 4.1 mmol/L (ref 3.5–5.3)
Sodium: 135 mmol/L (ref 135–146)
Total Bilirubin: 0.6 mg/dL (ref 0.2–1.2)
Total Protein: 7 g/dL (ref 6.1–8.1)
eGFR: 103 mL/min/{1.73_m2} (ref >=60)

## 2022-03-11 LAB — HIV-1/2 AB - DIFFERENTIATION
HIV-1 antibody: POSITIVE — AB
HIV-2 Ab: NEGATIVE

## 2022-03-11 LAB — RPR: RPR Ser Ql: NONREACTIVE

## 2022-03-11 LAB — HEPATITIS B SURFACE ANTIGEN: Hepatitis B Surface Ag: NONREACTIVE

## 2022-03-11 LAB — HIV-1 RNA ULTRAQUANT REFLEX TO GENTYP+
HIV 1 RNA Quant: 43 copies/mL — ABNORMAL HIGH
HIV-1 RNA Quant, Log: 1.63 Log copies/mL — ABNORMAL HIGH

## 2022-03-11 LAB — LIPID PANEL
Cholesterol: 136 mg/dL (ref <200)
HDL: 35 mg/dL — ABNORMAL LOW (ref >=40)
Non-HDL Cholesterol (Calc): 101 mg/dL (calc) (ref <130)
Total CHOL/HDL Ratio: 3.9 (calc) (ref <5.0)
Triglycerides: 723 mg/dL — ABNORMAL HIGH (ref <150)

## 2022-03-11 LAB — HEPATITIS B CORE ANTIBODY, TOTAL: Hep B Core Total Ab: NONREACTIVE

## 2022-03-11 LAB — HLA B*5701: HLA-B*5701 w/rflx HLA-B High: NEGATIVE

## 2022-03-11 LAB — HEPATITIS C ANTIBODY: Hepatitis C Ab: NONREACTIVE

## 2022-03-11 LAB — HEPATITIS B SURFACE ANTIBODY,QUALITATIVE: Hep B S Ab: REACTIVE — AB

## 2022-03-12 ENCOUNTER — Other Ambulatory Visit: Payer: Self-pay | Admitting: Nurse Practitioner

## 2022-03-12 ENCOUNTER — Other Ambulatory Visit: Payer: Self-pay

## 2022-03-12 DIAGNOSIS — F411 Generalized anxiety disorder: Secondary | ICD-10-CM

## 2022-03-12 DIAGNOSIS — I10 Essential (primary) hypertension: Secondary | ICD-10-CM

## 2022-03-12 MED ORDER — ESCITALOPRAM OXALATE 20 MG PO TABS: 20.0000 mg | ORAL_TABLET | Freq: Every day | ORAL | 1 refills | Status: DC

## 2022-03-12 MED ORDER — AMLODIPINE BESYLATE 10 MG PO TABS: 10.0000 mg | ORAL_TABLET | Freq: Every day | ORAL | 1 refills | Status: DC

## 2022-03-12 NOTE — Telephone Encounter (Signed)
Please review its says you d/c  before

## 2022-03-19 ENCOUNTER — Encounter: Payer: Self-pay | Admitting: Family

## 2022-03-19 ENCOUNTER — Ambulatory Visit: Payer: BC Managed Care – PPO | Admitting: Family

## 2022-03-19 ENCOUNTER — Other Ambulatory Visit: Payer: Self-pay

## 2022-03-19 VITALS — BP 144/88 | HR 98 | Temp 98.1°F | Ht 69.0 in | Wt 177.0 lb

## 2022-03-19 DIAGNOSIS — Z79899 Other long term (current) drug therapy: Secondary | ICD-10-CM | POA: Diagnosis not present

## 2022-03-19 DIAGNOSIS — Z21 Asymptomatic human immunodeficiency virus [HIV] infection status: Secondary | ICD-10-CM

## 2022-03-19 DIAGNOSIS — Z0000 Encounter for general adult medical examination without abnormal findings: Secondary | ICD-10-CM

## 2022-03-19 DIAGNOSIS — Z113 Encounter for screening for infections with a predominantly sexual mode of transmission: Secondary | ICD-10-CM

## 2022-03-19 DIAGNOSIS — B20 Human immunodeficiency virus [HIV] disease: Secondary | ICD-10-CM | POA: Diagnosis not present

## 2022-03-19 MED ORDER — BICTEGRAVIR-EMTRICITAB-TENOFOV 50-200-25 MG PO TABS: 1.0000 | ORAL_TABLET | Freq: Every day | ORAL | 5 refills | Status: DC

## 2022-03-19 NOTE — Progress Notes (Signed)
Brief Narrative   Patient ID: [name], male    DOB: 02/02/1966, 56 y.o.   MRN: [id number]  Mr. Boyden is a 56 y/o caucasian male diagnosed with HIV-1 disease in 2007 with CD4 count <100 and viral load >1 million. No genosure available. Entered care at [facility] Stage 3. [id number] negative. ART experienced with Abavavir/lamivudine and raltegravir, Triumeq and Biktarvy  Subjective:    Chief Complaint  Patient presents with   HIV Positive/AIDS    HPI:  [name] is a 56 y.o. male with previous medical history of HIV disease, hypertension, restless leg syndrome and obstructive sleep apnea presenting today to transfer care for HIV disease.  Mr. Cho was previously followed by [facility] and was last seen on 11/14/21 with well controlled virus and good adherence and tolerance to Biktarvy. Viral load was undetectable and CD4 count was 1,008. He has since moved to [place], [place] and is transferring care to [facility] for ease of access.   Mr. Stille was initially diagnosed with HIV disease in 2007 with risk factor of MSM. Initial CD4 count <100 and viral load was >1 million. No history of opportunistic infection. ART experienced with Abavavir/lamivudine and raltegravir, Triumeq and Biktarvy. He was on [place] earlier which was stopped secondary to insurance issues and returned back to [place]. Wishes to get back on Cabenuva in the beginning of the year. 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 Continues to take Biktarvy as prescribed with no adverse side effects or problems obtaining medication from the pharmacy. Condoms and STD testing offered. Vaccinations were declined.   Denies fevers, chills, night sweats, headaches, changes in vision, neck pain/stiffness, nausea, diarrhea, vomiting, lesions or rashes.   Allergies  Allergen Reactions   Other     Dust mites and roaches       Outpatient Medications Prior to Visit  Medication Sig Dispense  Refill   albuterol (VENTOLIN HFA) 108 (90 Base) MCG/ACT inhaler TAKE 2 PUFFS BY MOUTH EVERY 6 HOURS AS NEEDED FOR WHEEZE OR SHORTNESS OF BREATH 8.5 each 2   amLODipine (NORVASC) 10 MG tablet Take 1 tablet (10 mg total) by mouth daily. 90 tablet 1   cetirizine (ZYRTEC) 10 MG tablet Take 10 mg by mouth daily.     cyclobenzaprine (FLEXERIL) 10 MG tablet TAKE 1 TABLET BY MOUTH TWICE A DAY AS NEEDED FOR MUSCLE SPASMS 45 tablet 1   escitalopram (LEXAPRO) 20 MG tablet Take 1 tablet (20 mg total) by mouth daily. 90 tablet 1   famciclovir (FAMVIR) 500 MG tablet TAKE 1 TABLET BY MOUTH TWICE DAILY DURING A FLARE UP/OUTBREAK UNTIL RESOLVED. 60 tablet 3   fluticasone (FLONASE) 50 MCG/ACT nasal spray Place 1 spray into both nostrils daily.     lisinopril-hydrochlorothiazide (ZESTORETIC) 10-12.5 MG tablet Take 1 tablet by mouth daily. 30 tablet 2   mometasone-formoterol (DULERA) 200-5 MCG/ACT AERO Inhale into the lungs.     mupirocin ointment (BACTROBAN) 2 % Apply 1 application. topically 2 (two) times daily. 30 g 2   pramipexole (MIRAPEX) 0.5 MG tablet TAKE 1 TABLET (0.5 MG TOTAL) BY MOUTH AT BEDTIME. TAKE 2-3 HOURS PRIOR TO GOING TO BED. 90 tablet 0   terbinafine (LAMISIL) 250 MG tablet Take 1 tablet (250 mg total) by mouth daily. For 12 weeks 90 tablet 0   bictegravir-emtricitabine-tenofovir AF (BIKTARVY) 50-200-25 MG TABS tablet Take 1 tablet by mouth daily.      No facility-administered medications prior to visit.  Past Medical History:  Diagnosis Date   Allergy    COPD (chronic obstructive pulmonary disease) ([place])    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   HIV (human immunodeficiency virus infection) ([place])    Hypertension    Sleep apnea      Past Surgical History:  Procedure Laterality Date   COLONOSCOPY WITH PROPOFOL N/A 03/22/2019   Procedure: COLONOSCOPY WITH BIOPSIES;  Surgeon: Lucilla Lame, MD;  Location: [place];  Service: Endoscopy;  Laterality: N/A;   I & D  EXTREMITY Left 05/21/2019   Procedure: IRRIGATION AND DEBRIDEMENT LEFT LEG;  Surgeon: Herbert Pun, MD;  Location: [facility];  Service: General;  Laterality: Left;   I & D EXTREMITY Left 05/25/2019   Procedure: IRRIGATION AND DEBRIDEMENT Left Leg;  Surgeon: Herbert Pun, MD;  Location: [facility];  Service: General;  Laterality: Left;   POLYPECTOMY N/A 03/22/2019   Procedure: POLYPECTOMY;  Surgeon: Lucilla Lame, MD;  Location: [place];  Service: Endoscopy;  Laterality: N/A;      Review of Systems  Constitutional:  Negative for appetite change, chills, fatigue, fever and unexpected weight change.  Eyes:  Negative for visual disturbance.  Respiratory:  Negative for cough, chest tightness, shortness of breath and wheezing.   Cardiovascular:  Negative for chest pain and leg swelling.  Gastrointestinal:  Negative for abdominal pain, constipation, diarrhea, nausea and vomiting.  Genitourinary:  Negative for dysuria, flank pain, frequency, genital sores, hematuria and urgency.  Skin:  Negative for rash.  Allergic/Immunologic: Negative for immunocompromised state.  Neurological:  Negative for dizziness and headaches.      Objective:    BP (!) 144/88   Pulse 98   Temp 98.1 F (36.7 C) (Oral)   Ht '5\' 9"'$  (1.753 m)   Wt 177 lb (80.3 kg)   SpO2 100%   BMI 26.14 kg/m  Nursing note and vital signs reviewed.  Physical Exam Constitutional:      General: He is not in acute distress.    Appearance: He is well-developed.  Eyes:     Conjunctiva/sclera: Conjunctivae normal.  Cardiovascular:     Rate and Rhythm: Normal rate and regular rhythm.     Heart sounds: Normal heart sounds. No murmur heard.    No friction rub. No gallop.  Pulmonary:     Effort: Pulmonary effort is normal. No respiratory distress.     Breath sounds: Normal breath sounds. No wheezing or rales.  Chest:     Chest wall: No tenderness.  Abdominal:     General: Bowel sounds are normal.      Palpations: Abdomen is soft.     Tenderness: There is no abdominal tenderness.  Musculoskeletal:     Cervical back: Neck supple.  Lymphadenopathy:     Cervical: No cervical adenopathy.  Skin:    General: Skin is warm and dry.     Findings: No rash.  Neurological:     Mental Status: He is alert and oriented to person, place, and time.  Psychiatric:        Behavior: Behavior normal.        Thought Content: Thought content normal.        Judgment: Judgment normal.         03/19/2022    8:58 AM 01/15/2022   10:54 AM 10/16/2021    9:48 AM 08/16/2021   10:04 AM 03/06/2021   12:04 PM  Depression screen PHQ 2/9  Decreased Interest 0 0 0 0  0  Down, Depressed, Hopeless 0 0 0 0 0  PHQ - 2 Score 0 0 0 0 0       Assessment & Plan:    Patient Active Problem List   Diagnosis Date Noted   Healthcare maintenance 03/19/2022   Obstructive sleep apnea 09/30/2021   Restless leg syndrome 09/30/2021   Elevated ferritin level 09/30/2021   Cellulitis 05/19/2019   Hypokalemia 05/19/2019   Polyp of transverse colon    Trochanteric bursitis of left hip 03/11/2019   Osteoarthritis of knee 03/11/2019   Cigarette nicotine dependence without complication 84/13/2440   Chronic insomnia 11/04/2018   Essential hypertension 03/24/2018   HIV (human immunodeficiency virus infection) ([place]) 03/03/2018   Acute bronchitis with COPD ([place]) 03/03/2018     Problem List Items Addressed This Visit       Other   HIV (human immunodeficiency virus infection) ([place]) - Primary    Mr. College has well controlled virus and good adherence and tolerance to [employer]. Reviewed previous lab work at [facility] and discussed plan of care. Check lab work today include [employer] as he wishes to go back on [place] in the beginning of 2024. Continue current dose of Biktarvy. Plan for follow up in 6 months or sooner if needed with lab work on the same day.       Relevant Medications   bictegravir-emtricitabine-tenofovir AF  (BIKTARVY) 50-200-25 MG TABS tablet   Other Relevant Orders   Comprehensive metabolic panel   HIV-1 RNA quant-no reflex-bld   T-helper cell (CD4)- ([facility] clinic only)   CBC with Differential/Platelet   Healthcare maintenance    Discussed importance of safe sexual practice and condom use. Condoms and STD testing offered.  Declines vaccinations.       Other Visit Diagnoses     Screening for STDs (sexually transmitted diseases)       Relevant Orders   RPR   Pharmacologic therapy       Relevant Orders   Lipid panel        I am having [name] maintain his fluticasone, cetirizine, Dulera, mupirocin ointment, famciclovir, pramipexole, lisinopril-hydrochlorothiazide, terbinafine, albuterol, cyclobenzaprine, amLODipine, escitalopram, and bictegravir-emtricitabine-tenofovir AF.   Meds ordered this encounter  Medications   bictegravir-emtricitabine-tenofovir AF (BIKTARVY) 50-200-25 MG TABS tablet    Sig: Take 1 tablet by mouth daily.    Dispense:  30 tablet    Refill:  5    Please mail to [facility] [phone] in [place] Question:   Supervising Provider    Answer:   Carlyle Basques [[id number]]     Follow-up: Return in about 6 months (around 09/17/2022), or if symptoms worsen or fail to improve.   Terri Piedra, MSN, FNP-C Nurse Practitioner [facility] [place] number: [phone]

## 2022-03-19 NOTE — Patient Instructions (Addendum)
Nice to see you.  We will check your lab work today.  Continue to take your medication daily as prescribed.  Refills have been sent to the pharmacy.  [email or web address]  [facility] or [employer] for follow up in 6 months or sooner if needed with lab work on the same day.  Have a great day and stay safe!

## 2022-03-19 NOTE — Assessment & Plan Note (Signed)
Discussed importance of safe sexual practice and condom use. Condoms and STD testing offered.  Declines vaccinations.  

## 2022-03-19 NOTE — Assessment & Plan Note (Signed)
Mr. [name] has well controlled virus and good adherence and tolerance to Biktarvy. Reviewed previous lab work at [facility] and discussed plan of care. Check lab work today include [employer] as he wishes to go back on [place] in the beginning of 2024. Continue current dose of Biktarvy. Plan for follow up in 6 months or sooner if needed with lab work on the same day.

## 2022-03-20 LAB — T-HELPER CELL (CD4) - (RCID CLINIC ONLY)
CD4 % Helper T Cell: 42 % (ref 33–65)
CD4 T Cell Abs: 562 /uL (ref 400–1790)

## 2022-03-21 LAB — COMPREHENSIVE METABOLIC PANEL
AG Ratio: 1.5 (calc) (ref 1.0–2.5)
ALT: 79 U/L — ABNORMAL HIGH (ref 9–46)
AST: 87 U/L — ABNORMAL HIGH (ref 10–35)
Albumin: 3.9 g/dL (ref 3.6–5.1)
Alkaline phosphatase (APISO): 94 U/L (ref 35–144)
BUN: 9 mg/dL (ref 7–25)
CO2: 30 mmol/L (ref 20–32)
Calcium: 9.2 mg/dL (ref 8.6–10.3)
Chloride: 99 mmol/L (ref 98–110)
Creat: 0.94 mg/dL (ref 0.70–1.30)
Globulin: 2.6 g/dL (calc) (ref 1.9–3.7)
Glucose, Bld: 118 mg/dL — ABNORMAL HIGH (ref 65–99)
Potassium: 3.9 mmol/L (ref 3.5–5.3)
Sodium: 136 mmol/L (ref 135–146)
Total Bilirubin: 0.7 mg/dL (ref 0.2–1.2)
Total Protein: 6.5 g/dL (ref 6.1–8.1)

## 2022-03-21 LAB — CBC WITH DIFFERENTIAL/PLATELET
Absolute Monocytes: 479 cells/uL (ref 200–950)
Basophils Absolute: 71 cells/uL (ref 0–200)
Basophils Relative: 1.4 %
Eosinophils Absolute: 148 cells/uL (ref 15–500)
Eosinophils Relative: 2.9 %
HCT: 46 % (ref 38.5–50.0)
Hemoglobin: 16.5 g/dL (ref 13.2–17.1)
Lymphs Abs: 1581 cells/uL (ref 850–3900)
MCH: 34.8 pg — ABNORMAL HIGH (ref 27.0–33.0)
MCHC: 35.9 g/dL (ref 32.0–36.0)
MCV: 97 fL (ref 80.0–100.0)
MPV: 11 fL (ref 7.5–12.5)
Monocytes Relative: 9.4 %
Neutro Abs: 2820 cells/uL (ref 1500–7800)
Neutrophils Relative %: 55.3 %
Platelets: 258 10*3/uL (ref 140–400)
RBC: 4.74 10*6/uL (ref 4.20–5.80)
RDW: 13.1 % (ref 11.0–15.0)
Total Lymphocyte: 31 %
WBC: 5.1 10*3/uL (ref 3.8–10.8)

## 2022-03-21 LAB — RPR: RPR Ser Ql: NONREACTIVE

## 2022-03-21 LAB — LIPID PANEL
Cholesterol: 225 mg/dL — ABNORMAL HIGH (ref <200)
HDL: 16 mg/dL — ABNORMAL LOW (ref >=40)
Non-HDL Cholesterol (Calc): 209 mg/dL (calc) — ABNORMAL HIGH (ref <130)
Total CHOL/HDL Ratio: 14.1 (calc) — ABNORMAL HIGH (ref <5.0)
Triglycerides: 1760 mg/dL — ABNORMAL HIGH (ref <150)

## 2022-03-21 LAB — HIV-1 RNA QUANT-NO REFLEX-BLD
HIV 1 RNA Quant: NOT DETECTED Copies/mL
HIV-1 RNA Quant, Log: NOT DETECTED Log cps/mL

## 2022-04-10 ENCOUNTER — Other Ambulatory Visit: Payer: Self-pay | Admitting: Nurse Practitioner

## 2022-04-10 DIAGNOSIS — F1010 Alcohol abuse, uncomplicated: Secondary | ICD-10-CM | POA: Diagnosis not present

## 2022-04-10 DIAGNOSIS — Z720 Tobacco use: Secondary | ICD-10-CM | POA: Diagnosis not present

## 2022-04-10 DIAGNOSIS — G2581 Restless legs syndrome: Secondary | ICD-10-CM

## 2022-04-10 DIAGNOSIS — I10 Essential (primary) hypertension: Secondary | ICD-10-CM | POA: Diagnosis not present

## 2022-04-10 DIAGNOSIS — Z23 Encounter for immunization: Secondary | ICD-10-CM | POA: Diagnosis not present

## 2022-04-12 ENCOUNTER — Other Ambulatory Visit: Payer: Self-pay | Admitting: Nurse Practitioner

## 2022-04-12 DIAGNOSIS — I10 Essential (primary) hypertension: Secondary | ICD-10-CM

## 2022-04-16 ENCOUNTER — Encounter: Payer: Self-pay | Admitting: Nurse Practitioner

## 2022-04-16 ENCOUNTER — Other Ambulatory Visit: Payer: Self-pay | Admitting: Nurse Practitioner

## 2022-04-16 ENCOUNTER — Ambulatory Visit: Payer: BC Managed Care – PPO | Admitting: Nurse Practitioner

## 2022-04-16 VITALS — BP 128/86 | HR 87 | Temp 97.7°F | Resp 16 | Ht 69.0 in | Wt 176.4 lb

## 2022-04-16 DIAGNOSIS — M25561 Pain in right knee: Secondary | ICD-10-CM | POA: Diagnosis not present

## 2022-04-16 DIAGNOSIS — G8929 Other chronic pain: Secondary | ICD-10-CM

## 2022-04-16 DIAGNOSIS — M25562 Pain in left knee: Secondary | ICD-10-CM

## 2022-04-16 DIAGNOSIS — R748 Abnormal levels of other serum enzymes: Secondary | ICD-10-CM

## 2022-04-16 DIAGNOSIS — G2581 Restless legs syndrome: Secondary | ICD-10-CM

## 2022-04-16 DIAGNOSIS — E782 Mixed hyperlipidemia: Secondary | ICD-10-CM | POA: Diagnosis not present

## 2022-04-16 DIAGNOSIS — M170 Bilateral primary osteoarthritis of knee: Secondary | ICD-10-CM | POA: Diagnosis not present

## 2022-04-16 MED ORDER — PRAMIPEXOLE DIHYDROCHLORIDE 0.5 MG PO TABS: 0.5000 mg | ORAL_TABLET | Freq: Every day | ORAL | 0 refills | Status: AC

## 2022-04-16 NOTE — Progress Notes (Signed)
[facility] [place], [place] [address]  Internal MEDICINE  Office Visit Note  Patient Name: [name]  [id number]  [id number]  Date of Service: 04/16/2022  Chief Complaint  Patient presents with   Follow-up    Review CT    HPI Arlyn presents for a follow up visit for  CT bilateral knee results --Possible degeneration or tear bilaterally not well delineated No acute injury definable on CT scan. Will need further evaluation.  Blood pressure -- improved when rechecked Low testosterone -- can do HRT but will need monitoring labs every 3 months Recheck lipids and liver enzymes, almost done with terbinafine, 5 days left.       Current Medication: Outpatient Encounter Medications as of 04/16/2022  Medication Sig   albuterol (VENTOLIN HFA) 108 (90 Base) MCG/ACT inhaler TAKE 2 PUFFS BY MOUTH EVERY 6 HOURS AS NEEDED FOR WHEEZE OR SHORTNESS OF BREATH   amLODipine (NORVASC) 10 MG tablet Take 1 tablet (10 mg total) by mouth daily.   bictegravir-emtricitabine-tenofovir AF (BIKTARVY) 50-200-25 MG TABS tablet Take 1 tablet by mouth daily.   cetirizine (ZYRTEC) 10 MG tablet Take 10 mg by mouth daily.   cyclobenzaprine (FLEXERIL) 10 MG tablet TAKE 1 TABLET BY MOUTH TWICE A DAY AS NEEDED FOR MUSCLE SPASM   escitalopram (LEXAPRO) 20 MG tablet Take 1 tablet (20 mg total) by mouth daily.   famciclovir (FAMVIR) 500 MG tablet TAKE 1 TABLET BY MOUTH TWICE DAILY DURING A FLARE UP/OUTBREAK UNTIL RESOLVED.   fluticasone (FLONASE) 50 MCG/ACT nasal spray Place 1 spray into both nostrils daily.   lisinopril-hydrochlorothiazide (ZESTORETIC) 10-12.5 MG tablet TAKE 1 TABLET BY MOUTH EVERY DAY   mometasone-formoterol (DULERA) 200-5 MCG/ACT AERO Inhale into the lungs.   [DISCONTINUED] pramipexole (MIRAPEX) 0.5 MG tablet TAKE 1 TABLET (0.5 MG TOTAL) BY MOUTH AT BEDTIME. TAKE 2-3 HOURS PRIOR TO GOING TO BED.   [DISCONTINUED] terbinafine (LAMISIL) 250 MG tablet Take 1 tablet (250 mg  total) by mouth daily. For 12 weeks   mupirocin ointment (BACTROBAN) 2 % Apply 1 application. topically 2 (two) times daily.   pramipexole (MIRAPEX) 0.5 MG tablet Take 1 tablet (0.5 mg total) by mouth at bedtime. Take 2-3 hours prior to going to bed.   No facility-administered encounter medications on file as of 04/16/2022.    Surgical History: Past Surgical History:  Procedure Laterality Date   COLONOSCOPY WITH PROPOFOL N/A 03/22/2019   Procedure: COLONOSCOPY WITH BIOPSIES;  Surgeon: Lucilla Lame, MD;  Location: [facility];  Service: Endoscopy;  Laterality: N/A;   I & D EXTREMITY Left 05/21/2019   Procedure: IRRIGATION AND DEBRIDEMENT LEFT LEG;  Surgeon: Herbert Pun, MD;  Location: [facility];  Service: General;  Laterality: Left;   I & D EXTREMITY Left 05/25/2019   Procedure: IRRIGATION AND DEBRIDEMENT Left Leg;  Surgeon: Herbert Pun, MD;  Location: [facility];  Service: General;  Laterality: Left;   POLYPECTOMY N/A 03/22/2019   Procedure: POLYPECTOMY;  Surgeon: Lucilla Lame, MD;  Location: [place];  Service: Endoscopy;  Laterality: N/A;    Medical History: Past Medical History:  Diagnosis Date   Allergy    COPD (chronic obstructive pulmonary disease) ([place])    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   HIV (human immunodeficiency virus infection) ([place])    Hypertension    Sleep apnea     Family History: Family History  Adopted: Yes    Social History   Socioeconomic History   Marital  status: Divorced    Spouse name: Not on file   Number of children: Not on file   Years of education: Not on file   Highest education level: Not on file  Occupational History   Not on file  Tobacco Use   Smoking status: Every Day    Packs/day: 1.50    Years: 30.00    Total pack years: 45.00    Types: Cigarettes   Smokeless tobacco: Never   Tobacco comments:    1.5 packs daily  Vaping Use   Vaping Use: Never used  Substance and Sexual Activity    Alcohol use: Yes    Alcohol/week: 21.0 standard drinks of alcohol    Types: 21 Shots of liquor per week    Comment: weekends   Drug use: Never   Sexual activity: Not Currently  Other Topics Concern   Not on file  Social History Narrative   Not on file   Social Determinants of Health   Financial Resource Strain: Not on file  Food Insecurity: Not on file  Transportation Needs: Not on file  Physical Activity: Not on file  Stress: Not on file  Social Connections: Not on file  Intimate Partner Violence: Not on file      Review of Systems  Constitutional: Negative.  Negative for chills, fatigue and unexpected weight change.  HENT:  Negative for congestion, rhinorrhea, sneezing and sore throat.   Eyes:  Negative for redness.  Respiratory: Negative.  Negative for cough, chest tightness, shortness of breath and wheezing.   Cardiovascular: Negative.  Negative for chest pain and palpitations.  Gastrointestinal:  Negative for abdominal pain, constipation, diarrhea, nausea and vomiting.  Genitourinary:  Negative for dysuria and frequency.  Musculoskeletal:  Positive for arthralgias and joint swelling. Negative for back pain and neck pain.  Skin:  Negative for rash.  Neurological: Negative.  Negative for tremors and numbness.  Hematological:  Negative for adenopathy. Does not bruise/bleed easily.  Psychiatric/Behavioral:  Negative for behavioral problems (Depression), self-injury, sleep disturbance and suicidal ideas. The patient is not nervous/anxious.     Vital Signs: BP 128/86 Comment: 150/87  Pulse 87   Temp 97.7 F (36.5 C)   Resp 16   Ht '5\' 9"'$  (1.753 m)   Wt 176 lb 6.4 oz (80 kg)   SpO2 99%   BMI 26.05 kg/m    Physical Exam Vitals reviewed.  Constitutional:      General: He is not in acute distress.    Appearance: Normal appearance. He is normal weight. He is not ill-appearing.  HENT:     Head: Normocephalic and atraumatic.  Eyes:     Pupils: Pupils are equal,  round, and reactive to light.  Cardiovascular:     Rate and Rhythm: Normal rate and regular rhythm.  Pulmonary:     Effort: Pulmonary effort is normal. No respiratory distress.  Neurological:     Mental Status: He is alert and oriented to person, place, and time.  Psychiatric:        Mood and Affect: Mood normal.        Behavior: Behavior normal.        Assessment/Plan: 1. Bilateral chronic knee pain Referred to orthopedic for further evaluation - Ambulatory referral to Orthopedic Surgery  2. Primary osteoarthritis of both knees Referred to orthopedic - Ambulatory referral to Orthopedic Surgery  3. Elevated liver enzymes Repeat liver function panel - Hepatic function panel  4. Mixed hyperlipidemia Repeat cholesterol panel - Lipid Profile  5.  Restless leg syndrome - pramipexole (MIRAPEX) 0.5 MG tablet; Take 1 tablet (0.5 mg total) by mouth at bedtime. Take 2-3 hours prior to going to bed.  Dispense: 90 tablet; Refill: 0   General Counseling: Fergus verbalizes understanding of the findings of todays visit and agrees with plan of treatment. I have discussed any further diagnostic evaluation that may be needed or ordered today. We also reviewed his medications today. he has been encouraged to call the office with any questions or concerns that should arise related to todays visit.    Orders Placed This Encounter  Procedures   Lipid Profile   Hepatic function panel   Ambulatory referral to Orthopedic Surgery    Meds ordered this encounter  Medications   pramipexole (MIRAPEX) 0.5 MG tablet    Sig: Take 1 tablet (0.5 mg total) by mouth at bedtime. Take 2-3 hours prior to going to bed.    Dispense:  90 tablet    Refill:  0    Return in about 3 months (around 07/16/2022) for F/U, Prynce Jacober PCP.   Total time spent:30 Minutes Time spent includes review of chart, medications, test results, and follow up plan with the patient.   [place] Controlled Substance Database was reviewed  by me.  This patient was seen by Jonetta Osgood, FNP-C in collaboration with Dr. Clayborn Bigness as a part of collaborative care agreement.   Zaila Crew R. Valetta Fuller, MSN, FNP-C Internal medicine

## 2022-04-19 ENCOUNTER — Telehealth: Payer: Self-pay | Admitting: Nurse Practitioner

## 2022-04-19 NOTE — Telephone Encounter (Signed)
Orthopedic referral sent via [facility] to [facility]

## 2022-04-26 ENCOUNTER — Telehealth: Payer: Self-pay | Admitting: Nurse Practitioner

## 2022-04-26 NOTE — Telephone Encounter (Signed)
Per Rudene Christians, referral has been closed due to patient not returning their calls to schedule appointment-Toni

## 2022-04-27 ENCOUNTER — Encounter: Payer: Self-pay | Admitting: Nurse Practitioner

## 2022-04-27 IMAGING — CT CT CHEST LUNG CANCER SCREENING LOW DOSE W/O CM
2 of 5 series · 15 of 40 positions shown, 18 images · non-contrast
Comparison: None.

CLINICAL DATA: 54-year-old asymptomatic male current smoker with 45
pack-year smoking history.

EXAM:
CT CHEST WITHOUT CONTRAST LOW-DOSE FOR LUNG CANCER SCREENING
TECHNIQUE: Multidetector CT imaging of the chest was performed following the
standard protocol without IV contrast.

[Series 3: lung 1.00 · axial · 0.67mm/px · z∈[-1210,-890]mm · 12 of 354 slices shown, 15 images]
[im 17/354  mediastinal]
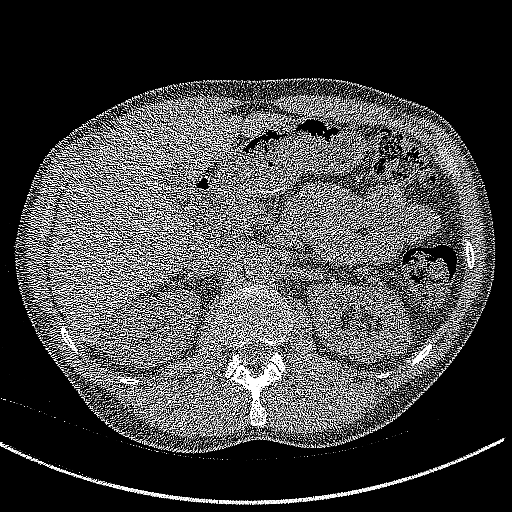
[im 17/354  lung]
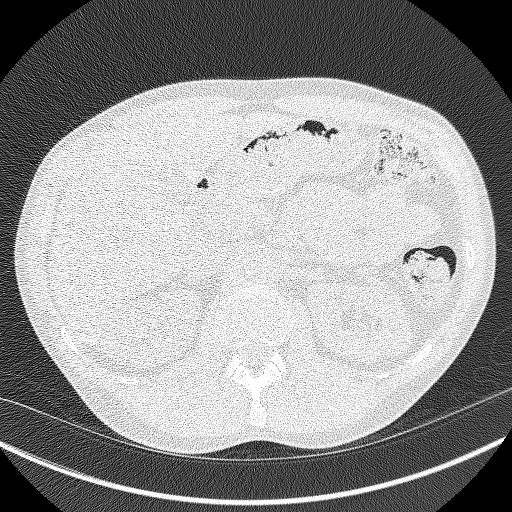
[im 49/354  lung]
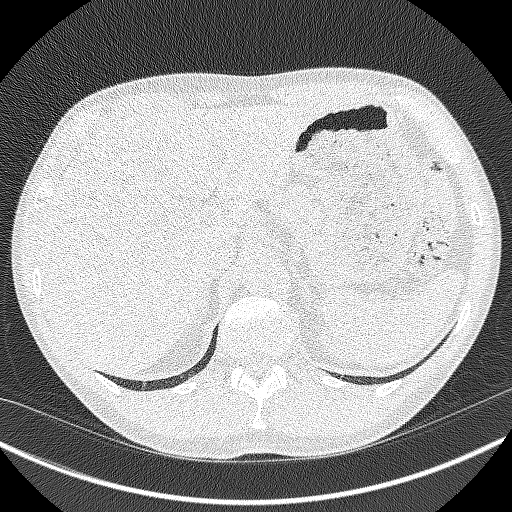
[im 81/354  lung]
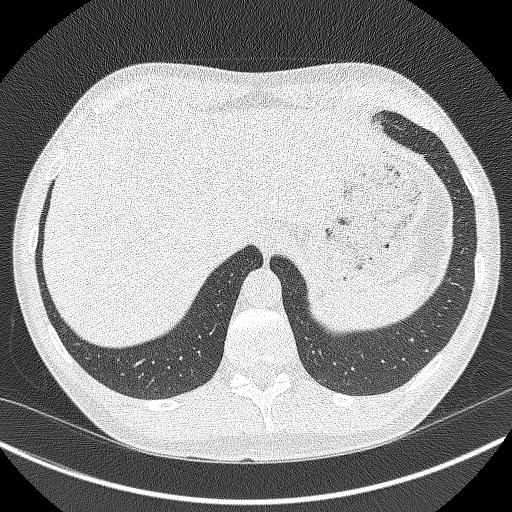
[im 113/354  lung]
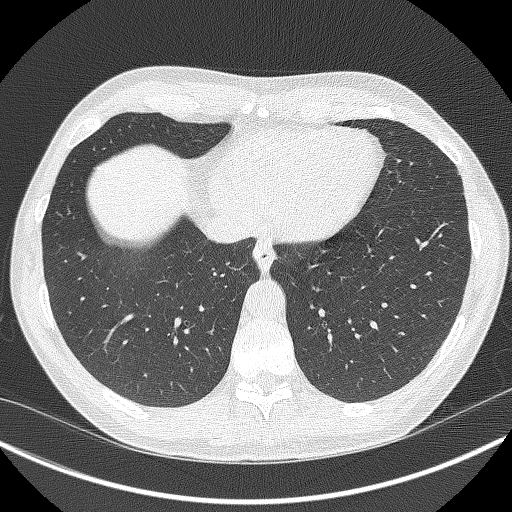
[im 129/354  mediastinal]
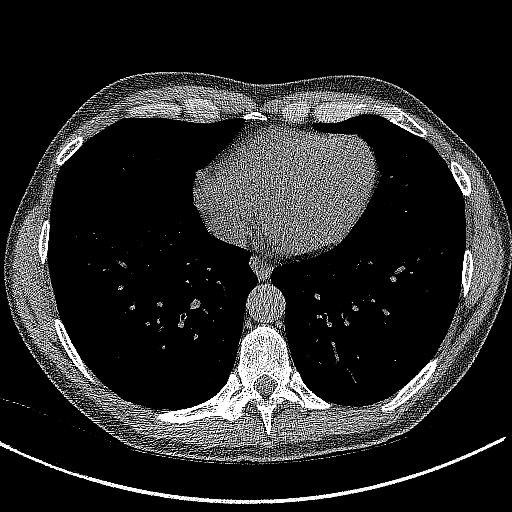
[im 129/354  lung]
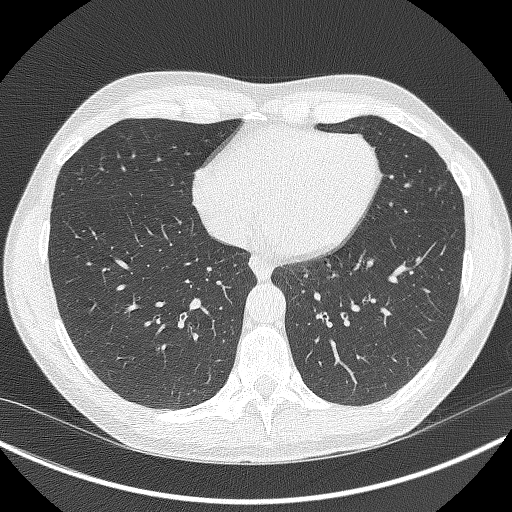
[im 161/354  lung]
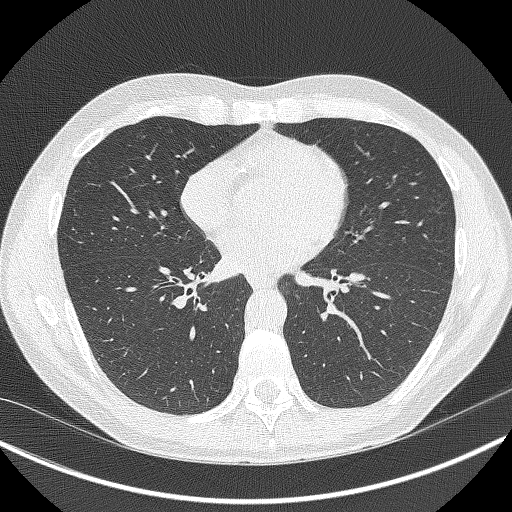
[im 193/354  lung]
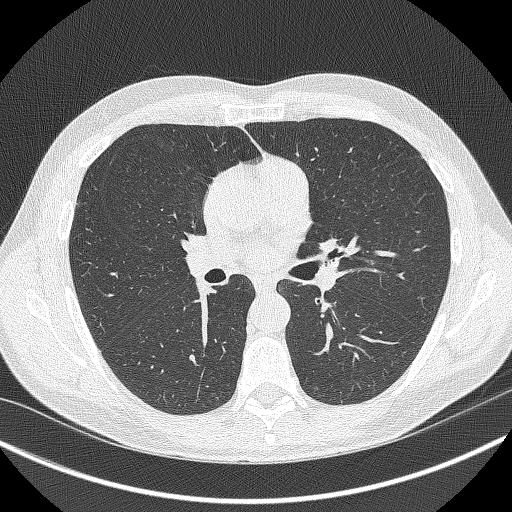
[im 225/354  lung]
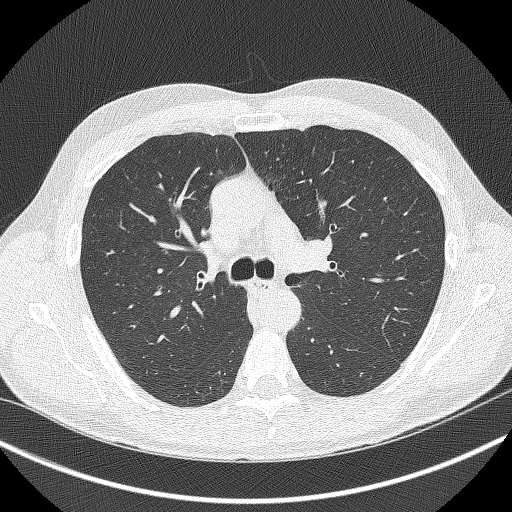
[im 241/354  mediastinal]
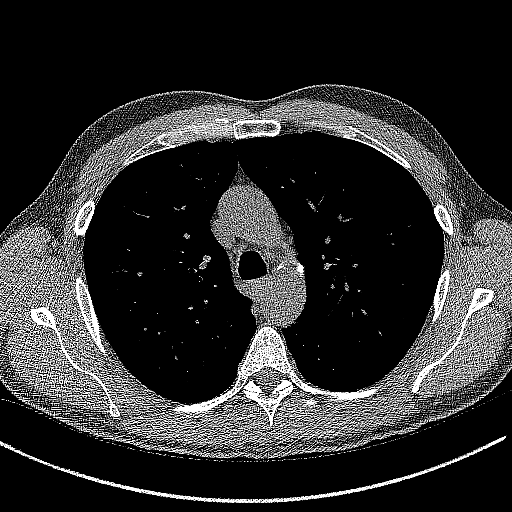
[im 241/354  lung]
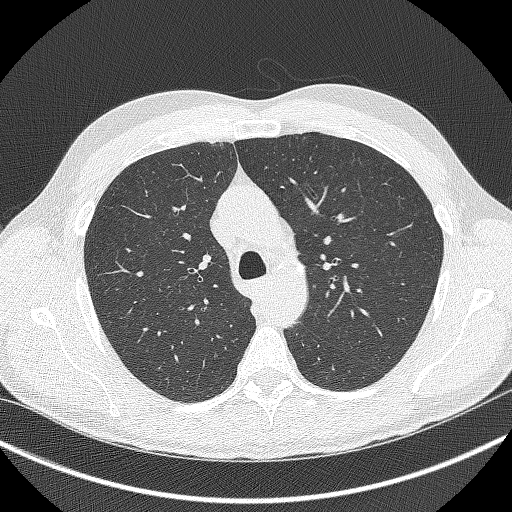
[im 273/354  lung]
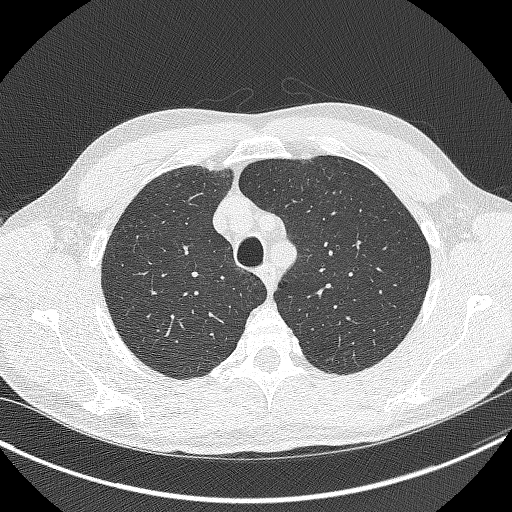
[im 305/354  lung]
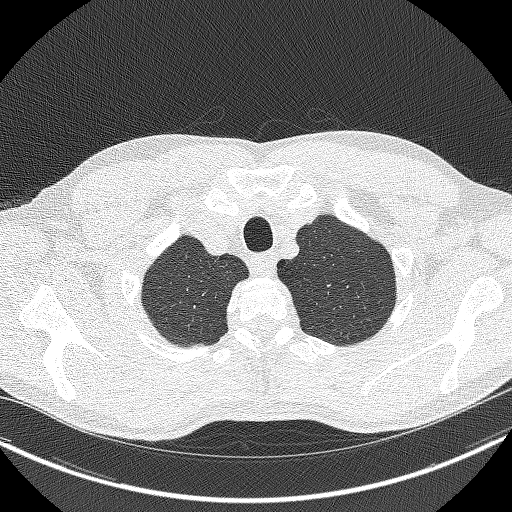
[im 337/354  lung]
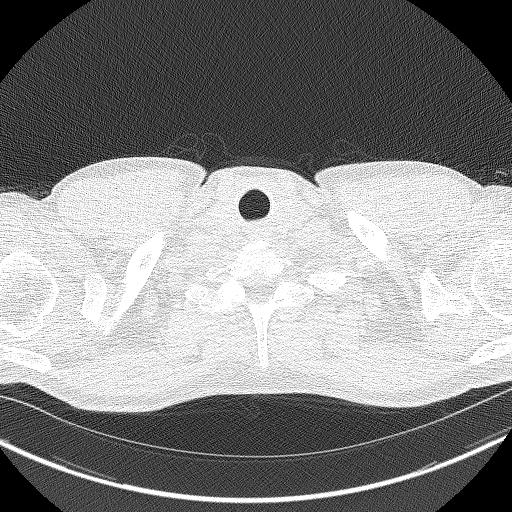

[Series 5: coronals lung 1.00 cor · coronal · 0.67mm/px · 3 of 267 slices shown]
[im 54/267  lung]
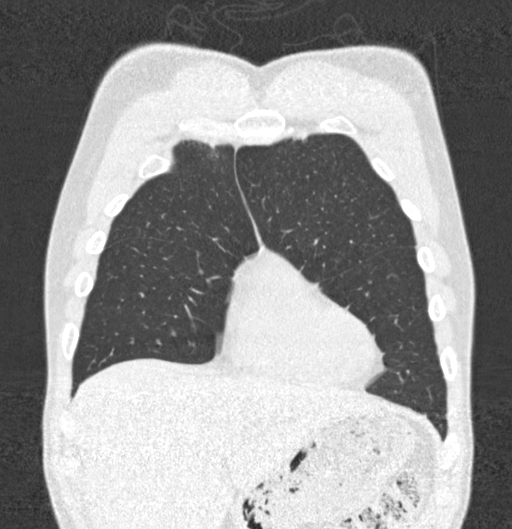
[im 107/267  lung]
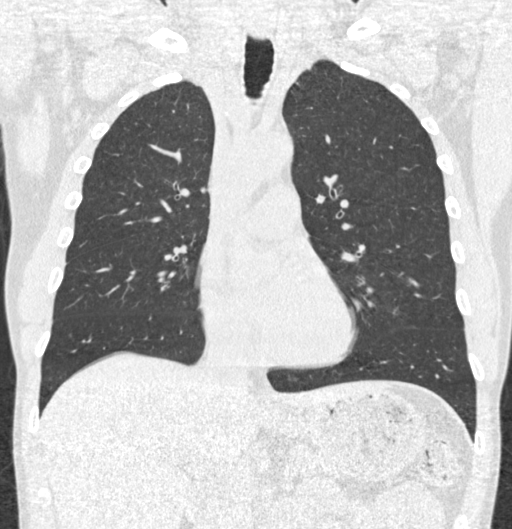
[im 160/267  lung]
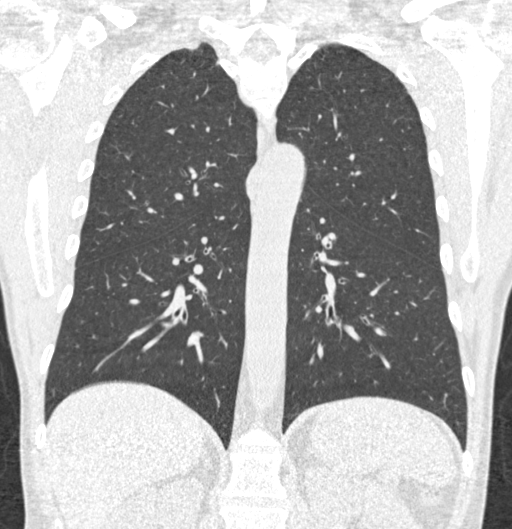

[15 of 40 positions shown; findings below may reference images not displayed]

FINDINGS: Cardiovascular: Normal heart size. No significant pericardial
effusion/thickening. Left anterior descending and right coronary
atherosclerosis. Atherosclerotic nonaneurysmal thoracic aorta.
Normal caliber pulmonary arteries.

Mediastinum/Nodes: No discrete thyroid nodules. Unremarkable
esophagus. No pathologically enlarged axillary, mediastinal or hilar
lymph nodes, noting limited sensitivity for the detection of hilar
adenopathy on this noncontrast study. Coarsely calcified nonenlarged
subcarinal and left hilar nodes compatible with prior granulomatous
disease.

Lungs/Pleura: No pneumothorax. No pleural effusion. Mild
centrilobular and paraseptal emphysema with diffuse bronchial wall
thickening. No acute consolidative airspace disease or lung masses.
Tiny scattered solid pulmonary nodules, largest 2.0 mm in volume
derived mean diameter in the posterior lingula (series 3/image 226).

Upper abdomen: Simple 1.1 cm posterior upper left renal cyst.

Musculoskeletal: No aggressive appearing focal osseous lesions. Mild
thoracic spondylosis.
IMPRESSION: Lung-RADS 2, benign appearance or behavior. Continue annual
screening with low-dose chest CT without contrast in 12 months.

Two vessel coronary atherosclerosis.

Aortic Atherosclerosis (DF4ER-IP6.6) and Emphysema (DF4ER-UNH.E).

## 2022-05-08 ENCOUNTER — Other Ambulatory Visit: Payer: Self-pay | Admitting: Nurse Practitioner

## 2022-05-28 ENCOUNTER — Telehealth: Payer: Self-pay | Admitting: Pharmacist

## 2022-05-28 ENCOUNTER — Other Ambulatory Visit (HOSPITAL_COMMUNITY): Payer: Self-pay

## 2022-05-28 NOTE — Telephone Encounter (Signed)
Patient called today asking about follow-up for Cab[place]va. He is still interested in starting and states he has copies of insurance approval that is good through ?next month. Reviewed that we are waiting on [place] results for him. Could not see results in [facility] link; will check faxes and ask if lab has seen. Will plan to seek [facility] approval for him in the meantime unless concerns from [place].  Alfonse Spruce, PharmD, CPP, BCIDP, [place] Clinical Pharmacist Practitioner [facility] [place] for [facility] Disease

## 2022-05-28 NOTE — Telephone Encounter (Signed)
Spoke with lab and they should be getting you the [employer] to review.

## 2022-05-29 ENCOUNTER — Other Ambulatory Visit (HOSPITAL_COMMUNITY): Payer: Self-pay

## 2022-05-29 ENCOUNTER — Encounter: Payer: Self-pay | Admitting: Pharmacist

## 2022-05-29 NOTE — Telephone Encounter (Signed)
[place] is clear; attached pictures to media tab. He will be working with Butch Penny as needed for approval if that is possible through his insurance. Estill Bamberg

## 2022-05-30 ENCOUNTER — Other Ambulatory Visit (HOSPITAL_COMMUNITY): Payer: Self-pay

## 2022-05-30 ENCOUNTER — Telehealth: Payer: Self-pay

## 2022-05-30 NOTE — Telephone Encounter (Signed)
I printed the copy of his cabenuva copay coupon card. However I will start a PA for the medication. I also spoke with the pt and he is aware of the PA.

## 2022-05-30 NOTE — Telephone Encounter (Signed)
[facility] Patient Advocate Encounter   Received notification from [facility] that prior authorization for [name] is required.   PA submitted on 05/30/22  Key [id number] Status is pending    [facility] will continue to follow.   Ileene Patrick, [facility] Patient [facility] for Infectious Disease Phone: [phone] Fax:  [phone]

## 2022-06-13 ENCOUNTER — Telehealth: Payer: Self-pay

## 2022-06-13 NOTE — Telephone Encounter (Signed)
.  [facility] Patient Advocate Encounter  I called Fonda Kinder / [place] @ [phone] to start PA for Medical Benefits for [id number] Kern Reap) I faxed chart notes and Labs to [phone] PA can take up to 5 Calendar days.   PA # [id number]  Ileene Patrick, [place] Patient [facility] for Infectious Disease Phone: [phone] Fax:  [phone]

## 2022-06-14 ENCOUNTER — Telehealth: Payer: Self-pay

## 2022-06-14 DIAGNOSIS — M5416 Radiculopathy, lumbar region: Secondary | ICD-10-CM | POA: Diagnosis not present

## 2022-06-14 NOTE — Telephone Encounter (Signed)
Appeal sent for Cabenuva injection approval. Awaiting response

## 2022-06-17 ENCOUNTER — Telehealth: Payer: Self-pay

## 2022-06-17 NOTE — Telephone Encounter (Signed)
[facility] Patient Advocate Encounter  Prior Authorization for Kern Reap has been approved.  Medical Benefits Number of Visits 8, Quantity 2400 Units.  MMS ID : [id number]  PA# [id number] Effective dates: 06/13/22 through 06/13/23  Patient is enrolled in [place] portal claims        [facility] will continue to follow.  Ileene Patrick, [facility] Patient [facility] for Infectious Disease Phone: [phone] Fax:  [phone]

## 2022-06-18 ENCOUNTER — Telehealth: Payer: Self-pay

## 2022-06-18 NOTE — Telephone Encounter (Signed)
Called patient to inform him that his insurance approved Cabenuva. Set up an appointment for his first dose on Thursday 06/20/22 with Cassie.

## 2022-06-20 ENCOUNTER — Other Ambulatory Visit (HOSPITAL_COMMUNITY): Payer: Self-pay

## 2022-06-20 ENCOUNTER — Ambulatory Visit (INDEPENDENT_AMBULATORY_CARE_PROVIDER_SITE_OTHER): Payer: BC Managed Care – PPO | Admitting: Pharmacist

## 2022-06-20 ENCOUNTER — Other Ambulatory Visit: Payer: Self-pay

## 2022-06-20 DIAGNOSIS — Z21 Asymptomatic human immunodeficiency virus [HIV] infection status: Secondary | ICD-10-CM

## 2022-06-20 MED ORDER — CABOTEGRAVIR & RILPIVIRINE ER 600 & 900 MG/3ML IM SUER
1.0000 | Freq: Once | INTRAMUSCULAR | Status: AC
  Administered 2022-06-20: 1 via INTRAMUSCULAR

## 2022-06-20 NOTE — Progress Notes (Addendum)
HPI: [name] is a 57 y.o. male who presents to the [place] clinic for [place] administration.  Patient Active Problem List   Diagnosis Date Noted   Healthcare maintenance 03/19/2022   Obstructive sleep apnea 09/30/2021   Restless leg syndrome 09/30/2021   Elevated ferritin level 09/30/2021   Cellulitis 05/19/2019   Hypokalemia 05/19/2019   Polyp of transverse colon    Trochanteric bursitis of left hip 03/11/2019   Osteoarthritis of knee 03/11/2019   Cigarette nicotine dependence without complication [id number]   Chronic insomnia 11/04/2018   Essential hypertension 03/24/2018   HIV (human immunodeficiency virus infection) ([place]) 03/03/2018   Acute bronchitis with COPD ([place]) 03/03/2018    Patient's Medications  New Prescriptions   No medications on file  Previous Medications   ALBUTEROL (VENTOLIN HFA) 108 (90 BASE) MCG/ACT INHALER    TAKE 2 PUFFS BY MOUTH EVERY 6 HOURS AS NEEDED FOR WHEEZE OR SHORTNESS OF BREATH   AMLODIPINE (NORVASC) 10 MG TABLET    Take 1 tablet (10 mg total) by mouth daily.   BICTEGRAVIR-EMTRICITABINE-TENOFOVIR AF (BIKTARVY) 50-200-25 MG TABS TABLET    Take 1 tablet by mouth daily.   CETIRIZINE (ZYRTEC) 10 MG TABLET    Take 10 mg by mouth daily.   CYCLOBENZAPRINE (FLEXERIL) 10 MG TABLET    TAKE 1 TABLET BY MOUTH TWICE A DAY AS NEEDED FOR MUSCLE SPASM   ESCITALOPRAM (LEXAPRO) 20 MG TABLET    Take 1 tablet (20 mg total) by mouth daily.   FAMCICLOVIR (FAMVIR) 500 MG TABLET    TAKE 1 TABLET BY MOUTH TWICE DAILY DURING A FLARE UP/OUTBREAK UNTIL RESOLVED.   FLUTICASONE (FLONASE) 50 MCG/ACT NASAL SPRAY    Place 1 spray into both nostrils daily.   LISINOPRIL-HYDROCHLOROTHIAZIDE (ZESTORETIC) 10-12.5 MG TABLET    TAKE 1 TABLET BY MOUTH EVERY DAY   MOMETASONE-FORMOTEROL (DULERA) 200-5 MCG/ACT AERO    Inhale into the lungs.   PRAMIPEXOLE (MIRAPEX) 0.5 MG TABLET    Take 1 tablet (0.5 mg total) by mouth at bedtime. Take 2-3 hours prior to going to bed.  Modified  Medications   No medications on file  Discontinued Medications   No medications on file    Allergies: Allergies  Allergen Reactions   Other     Dust mites and roaches     Past Medical History: Past Medical History:  Diagnosis Date   Allergy    COPD (chronic obstructive pulmonary disease) ([place])    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   HIV (human immunodeficiency virus infection) ([place])    Hypertension    Sleep apnea     Social History: Social History   Socioeconomic History   Marital status: Divorced    Spouse name: Not on file   Number of children: Not on file   Years of education: Not on file   Highest education level: Not on file  Occupational History   Not on file  Tobacco Use   Smoking status: Every Day    Packs/day: 1.50    Years: 30.00    Total pack years: 45.00    Types: Cigarettes   Smokeless tobacco: Never   Tobacco comments:    1.5 packs daily  Vaping Use   Vaping Use: Never used  Substance and Sexual Activity   Alcohol use: Yes    Alcohol/week: 21.0 standard drinks of alcohol    Types: 21 Shots of liquor per week    Comment: weekends   Drug use:  Never   Sexual activity: Not Currently  Other Topics Concern   Not on file  Social History Narrative   Not on file   Social Determinants of Health   Financial Resource Strain: Not on file  Food Insecurity: Not on file  Transportation Needs: Not on file  Physical Activity: Not on file  Stress: Not on file  Social Connections: Not on file    Labs: Lab Results  Component Value Date   HIV1RNAQUANT Not Detected 03/19/2022   HIV1RNAQUANT 43 (H) 03/05/2022   CD4TABS 562 03/19/2022   CD4TABS 768 03/05/2022    RPR and STI Lab Results  Component Value Date   LABRPR NON-REACTIVE 03/19/2022   LABRPR NON-REACTIVE 03/05/2022    STI Results GC CT  03/05/2022  9:44 AM Negative  Negative     Hepatitis B Lab Results  Component Value Date   HEPBSAB REACTIVE (A) 03/05/2022   HEPBSAG  NON-REACTIVE 03/05/2022   HEPBCAB NON-REACTIVE 03/05/2022   Hepatitis C Lab Results  Component Value Date   HEPCAB NON-REACTIVE 03/05/2022   Hepatitis A Lab Results  Component Value Date   HAV REACTIVE (A) 03/05/2022   Lipids: Lab Results  Component Value Date   CHOL 225 (H) 03/19/2022   TRIG 1,760 (H) 03/19/2022   HDL 16 (L) 03/19/2022   CHOLHDL 14.1 (H) 03/19/2022   [place]  03/19/2022     Comment:     . LDL cholesterol not calculated. Triglyceride levels greater than 400 mg/dL invalidate calculated LDL results. . Reference range: <100 . Desirable range <100 mg/dL for primary prevention;   <70 mg/dL for patients with CHD or diabetic patients  with > or = 2 CHD risk factors. Marland Kitchen LDL-C is now calculated using the Martin-Hopkins  calculation, which is a validated novel method providing  better accuracy than the Friedewald equation in the  estimation of LDL-C.  Cresenciano Genre et al. Annamaria Helling. [id number]): 2061-2068  (http://education.[facility].com/faq/FAQ164)     Current HIV Regimen: Biktarvy  TARGET DATE: 29th  Assessment: [name] presents today for his first initiation injection for Cabenuva. Counseled that [place] is two separate intramuscular injections in the gluteal muscle on each side for each visit. Explained that the second injection is 30 days after the initial injection then every 2 months thereafter. Discussed the rare but significant chance of developing resistance despite compliance. Explained that showing up to injection appointments is very important and warned that if 2 appointments are missed, it will be reassessed by their provider whether they are a good candidate for injection therapy. Counseled on possible side effects associated with the injections such as injection site pain, which is usually mild to moderate in nature, injection site nodules, and injection site reactions. Asked to call the clinic or send me a mychart message if they experience any  issues, such as fatigue, nausea, headache, rash, or dizziness. Advised that they can take ibuprofen or tylenol for injection site pain if needed. He is not sexually active and declines STI testing today.  Today [name] had some questions about his approval for [place]. He wanted to be sure his insurance had approved the medication as he got a rejection in the mail. Butch Penny spoke with him and clarified all questions about medication approval through insurance.   Administered cabotegravir '600mg'$ /92m in left upper outer quadrant of the gluteal muscle. Administered rilpivirine 900 mg/342min the right upper outer quadrant of the gluteal muscle. Monitored patient for 10 minutes after injection. Injections were tolerated well without issue. Counseled to stop  taking Biktarvy after today's dose and to call with any issues that may arise. Will make follow up appointments for second initiation injection in 30 days and then maintenance injections every 2 months thereafter.   Plan: - Stop Biktarvy after today's dose - First Cabenuva injections administered - Second initiation injection scheduled for 07/17/22 - First maintenance injection scheduled for 09/17/22 - Call with any issues or questions  Titus Dubin, PharmD PGY1 Pharmacy Resident 06/20/2022 10:32 AM

## 2022-06-22 LAB — HIV-1 RNA QUANT-NO REFLEX-BLD
HIV 1 RNA Quant: NOT DETECTED Copies/mL
HIV-1 RNA Quant, Log: NOT DETECTED Log cps/mL

## 2022-07-02 ENCOUNTER — Other Ambulatory Visit: Payer: Self-pay | Admitting: Nurse Practitioner

## 2022-07-02 DIAGNOSIS — I10 Essential (primary) hypertension: Secondary | ICD-10-CM

## 2022-07-02 DIAGNOSIS — B351 Tinea unguium: Secondary | ICD-10-CM

## 2022-07-03 MED ORDER — AMLODIPINE BESYLATE 10 MG PO TABS: 10.0000 mg | ORAL_TABLET | Freq: Every day | ORAL | 0 refills | Status: DC

## 2022-07-04 DIAGNOSIS — M5416 Radiculopathy, lumbar region: Secondary | ICD-10-CM | POA: Diagnosis not present

## 2022-07-16 ENCOUNTER — Ambulatory Visit: Payer: BC Managed Care – PPO | Admitting: Nurse Practitioner

## 2022-07-16 ENCOUNTER — Encounter: Payer: Self-pay | Admitting: Nurse Practitioner

## 2022-07-16 ENCOUNTER — Telehealth: Payer: Self-pay | Admitting: Internal Medicine

## 2022-07-16 ENCOUNTER — Telehealth: Payer: Self-pay

## 2022-07-16 VITALS — BP 143/88 | HR 85 | Temp 98.5°F | Ht 69.0 in | Wt 180.8 lb

## 2022-07-16 DIAGNOSIS — E781 Pure hyperglyceridemia: Secondary | ICD-10-CM | POA: Diagnosis not present

## 2022-07-16 DIAGNOSIS — I10 Essential (primary) hypertension: Secondary | ICD-10-CM

## 2022-07-16 DIAGNOSIS — M5416 Radiculopathy, lumbar region: Secondary | ICD-10-CM | POA: Diagnosis not present

## 2022-07-16 DIAGNOSIS — J449 Chronic obstructive pulmonary disease, unspecified: Secondary | ICD-10-CM

## 2022-07-16 DIAGNOSIS — Z21 Asymptomatic human immunodeficiency virus [HIV] infection status: Secondary | ICD-10-CM

## 2022-07-16 DIAGNOSIS — R7989 Other specified abnormal findings of blood chemistry: Secondary | ICD-10-CM

## 2022-07-16 DIAGNOSIS — Z1329 Encounter for screening for other suspected endocrine disorder: Secondary | ICD-10-CM

## 2022-07-16 DIAGNOSIS — Z125 Encounter for screening for malignant neoplasm of prostate: Secondary | ICD-10-CM

## 2022-07-16 MED ORDER — METHOCARBAMOL 750 MG PO TABS: 750.0000 mg | ORAL_TABLET | Freq: Four times a day (QID) | ORAL | 1 refills | Status: DC | PRN

## 2022-07-16 MED ORDER — LISINOPRIL-HYDROCHLOROTHIAZIDE 20-12.5 MG PO TABS: 1.0000 | ORAL_TABLET | Freq: Every day | ORAL | 3 refills | Status: DC

## 2022-07-16 NOTE — Assessment & Plan Note (Signed)
Elevated reading x 2 today in office. Patient reports occasionally checking at home and getting elevated readings. Will increase Lisinopril/HCTZ to 20-12.5. Patient did bring his home BP machine with him, it was compared to our machine in office with similar readings. Advised patient to start checking blood pressures daily and keep a log to bring with him to next appointment. Will continue to monitor. Follow up with Cardiology.

## 2022-07-16 NOTE — Assessment & Plan Note (Signed)
Patient continues to smoke. Counseled on smoking cessation. Only using albuterol inhaler approximately once a week. Denies shortness of breath. Lungs clear on exam. Will continue to monitor.

## 2022-07-16 NOTE — Progress Notes (Signed)
Justin Morrow, NP-C Phone: [phone]  [name] is a 57 y.o. male who presents today to establish care. He is currently seeing Ortho for low back pain with sciatica and doing Physical Therapy. He was getting Flexeril from his PCP to help with the muscles spasms, he is requesting something different as it has not been helping.   HYPERTENSION Disease Monitoring Home BP Monitoring- Not checking Chest pain- No    Dyspnea- No Medications Compliance-  Lisinopril/HCTZ, Norvasc. Lightheadedness-  No  Edema- No BMET    Component Value Date/Time   NA 136 03/19/2022 0938   NA 136 10/19/2021 1039   K 3.9 03/19/2022 0938   CL 99 03/19/2022 0938   CO2 30 03/19/2022 0938   GLUCOSE 118 (H) 03/19/2022 0938   BUN 9 03/19/2022 0938   BUN 13 10/19/2021 1039   CREATININE 0.94 03/19/2022 0938   CALCIUM 9.2 03/19/2022 0938   GFRNONAA 80 07/29/2019 1418   GFRAA 92 07/29/2019 1418   COPD: Medication compliance- None  Rescue inhaler use- Once a week Dyspnea- No  Wheezing- No  Cough- Yes  Productive- Yes  Hypertriglyceridemia- Not currently watching diet or exercising. Reports he is taking OTC fish oil. Denies abdominal pain.   Active Ambulatory Problems    Diagnosis Date Noted   HIV (human immunodeficiency virus infection) ([place]) 03/03/2018   Acute bronchitis with COPD ([place]) 03/03/2018   Polyp of transverse colon    Cellulitis 05/19/2019   Hypokalemia 05/19/2019   Trochanteric bursitis of left hip 03/11/2019   Osteoarthritis of knee 03/11/2019   Cigarette nicotine dependence without complication [id number]   Essential hypertension 03/24/2018   Chronic insomnia 11/04/2018   Obstructive sleep apnea 09/30/2021   Restless leg syndrome 09/30/2021   Elevated ferritin level 09/30/2021   Healthcare maintenance 03/19/2022   Hypertriglyceridemia 07/16/2022   Lumbar radiculopathy 07/16/2022   Chronic obstructive pulmonary disease ([place]) 07/16/2022   Resolved Ambulatory Problems    Diagnosis Date  Noted   Pulmonary hypertension, primary ([place]) 03/03/2018   Special screening for malignant neoplasms, colon    Protein-calorie malnutrition, moderate ([place]) 05/19/2019   Infected traumatic leg ulcer, left, with necrosis of muscle ([place]) 05/25/2019   Leg ulcer, left ([place]) 05/25/2019   Past Medical History:  Diagnosis Date   Allergy    Colon polyps    COPD (chronic obstructive pulmonary disease) ([facility])    Frostbite of both hands    Genital warts    Hypertension    Sleep apnea     Family History  Adopted: Yes    Social History   Socioeconomic History   Marital status: Divorced    Spouse name: Not on file   Number of children: Not on file   Years of education: Not on file   Highest education level: Not on file  Occupational History   Not on file  Tobacco Use   Smoking status: Every Day    Packs/day: 1.50    Years: 30.00    Additional pack years: 0.00    Total pack years: 45.00    Types: Cigarettes   Smokeless tobacco: Never   Tobacco comments:    1.5 packs daily  Vaping Use   Vaping Use: Never used  Substance and Sexual Activity   Alcohol use: Yes    Alcohol/week: 21.0 standard drinks of alcohol    Types: 21 Shots of liquor per week    Comment: weekends   Drug use: Never   Sexual activity: Not Currently  Other Topics Concern  Not on file  Social History Narrative   Not on file   Social Determinants of Health   Financial Resource Strain: Not on file  Food Insecurity: Not on file  Transportation Needs: Not on file  Physical Activity: Not on file  Stress: Not on file  Social Connections: Not on file  Intimate Partner Violence: Not on file    ROS  General:  Negative for unexplained weight loss, fever Skin: Negative for new or changing mole, sore that won't heal HEENT: Negative for trouble hearing, trouble seeing, ringing in ears, mouth sores, hoarseness, change in voice, dysphagia. CV:  Negative for chest pain, dyspnea, edema, palpitations Resp: Negative  for dyspnea, hemoptysis GI: Negative for nausea, vomiting, diarrhea, constipation, abdominal pain, melena, hematochezia. GU: Negative for dysuria, incontinence, urinary hesitance, hematuria, vaginal or penile discharge, polyuria, sexual difficulty, lumps in testicle or breasts MSK: Negative for joint pain or swelling Neuro: Negative for headaches, weakness, numbness, dizziness, passing out/fainting Psych: Negative for depression, anxiety, memory problems  Objective  Physical Exam Vitals:   07/16/22 0857 07/16/22 0929  BP: (!) 140/80 (!) 143/88  Pulse: 85   Temp: 98.5 F (36.9 C)   SpO2: 98%     BP Readings from Last 3 Encounters:  07/16/22 (!) 143/88  04/16/22 128/86  03/19/22 (!) 144/88   Wt Readings from Last 3 Encounters:  07/16/22 180 lb 12.8 oz (82 kg)  04/16/22 176 lb 6.4 oz (80 kg)  03/19/22 177 lb (80.3 kg)    Physical Exam Constitutional:      General: He is not in acute distress.    Appearance: Normal appearance.  HENT:     Head: Normocephalic.  Cardiovascular:     Rate and Rhythm: Normal rate and regular rhythm.     Heart sounds: Normal heart sounds.  Pulmonary:     Effort: Pulmonary effort is normal.     Breath sounds: Normal breath sounds.  Skin:    General: Skin is warm and dry.  Neurological:     General: No focal deficit present.     Mental Status: He is alert.  Psychiatric:        Mood and Affect: Mood normal.        Behavior: Behavior normal.    Assessment/Plan:   Essential hypertension Assessment & Plan: Elevated reading x 2 today in office. Patient reports occasionally checking at home and getting elevated readings. Will increase Lisinopril/HCTZ to 20-12.5. Patient did bring his home BP machine with him, it was compared to our machine in office with similar readings. Advised patient to start checking blood pressures daily and keep a log to bring with him to next appointment. Will continue to monitor. Follow up with Cardiology.   Orders: -      Comprehensive metabolic panel; Future -     CBC with Differential/Platelet; Future -     Lisinopril-hydroCHLOROthiazide; Take 1 tablet by mouth daily.  Dispense: 90 tablet; Refill: 3  Hypertriglyceridemia Assessment & Plan: Patient will return for fasting lipids. Encouraged healthy diet and exercise.   Orders: -     Lipid panel; Future  Asymptomatic HIV infection, with no history of HIV-related illness [facility]) Assessment & Plan: Viral load undetectable in February. Started on Cabenuva injections in February, second injection scheduled for tomorrow. Follow up with Infectious Disease.    Lumbar radiculopathy Assessment & Plan: Will discontinue Flexeril and start patient on Robaxin 750 mg Q6 PRN. Encouraged patient to continue physical therapy and follow up with Ortho.  Orders: -     Methocarbamol; Take 1 tablet (750 mg total) by mouth every 6 (six) hours as needed.  Dispense: 60 tablet; Refill: 1  Chronic obstructive pulmonary disease, unspecified COPD type ([place]) Assessment & Plan: Patient continues to smoke. Counseled on smoking cessation. Only using albuterol inhaler approximately once a week. Denies shortness of breath. Lungs clear on exam. Will continue to monitor.    Elevated ferritin level -     IBC + Ferritin; Future  Screening PSA (prostate specific antigen) -     PSA; Future  Thyroid disorder screen -     TSH; Future   Return in about 3 months (around 10/16/2022) for Follow up and complete fasting lab work this week.   Justin Morrow, NP-C [place]

## 2022-07-16 NOTE — Telephone Encounter (Signed)
In check-out instructions for today's visit, Justin Morrow, NP, asked that we schedule fasting labs for patient this week.  We only have afternoon slots available for our lab this week, so we scheduled patient's fasting lab visit on 07/23/2022.

## 2022-07-16 NOTE — Assessment & Plan Note (Signed)
Viral load undetectable in February. Started on Cabenuva injections in February, second injection scheduled for tomorrow. Follow up with Infectious Disease.

## 2022-07-16 NOTE — Telephone Encounter (Signed)
Patient called to self-discharge-[name] 

## 2022-07-16 NOTE — Assessment & Plan Note (Signed)
Patient will return for fasting lipids. Encouraged healthy diet and exercise.

## 2022-07-16 NOTE — Assessment & Plan Note (Addendum)
Will discontinue Flexeril and start patient on Robaxin 750 mg Q6 PRN. Encouraged patient to continue physical therapy and follow up with Ortho.

## 2022-07-17 ENCOUNTER — Other Ambulatory Visit: Payer: Self-pay

## 2022-07-17 ENCOUNTER — Ambulatory Visit (INDEPENDENT_AMBULATORY_CARE_PROVIDER_SITE_OTHER): Payer: BC Managed Care – PPO | Admitting: Pharmacist

## 2022-07-17 DIAGNOSIS — Z21 Asymptomatic human immunodeficiency virus [HIV] infection status: Secondary | ICD-10-CM

## 2022-07-17 MED ORDER — CABOTEGRAVIR & RILPIVIRINE ER 600 & 900 MG/3ML IM SUER
1.0000 | Freq: Once | INTRAMUSCULAR | Status: AC
  Administered 2022-07-17: 1 via INTRAMUSCULAR

## 2022-07-17 NOTE — Progress Notes (Signed)
HPI: [name] is a 57 y.o. male who presents to the [place] clinic for [place] administration.  Patient Active Problem List   Diagnosis Date Noted   Hypertriglyceridemia 07/16/2022   Lumbar radiculopathy 07/16/2022   Chronic obstructive pulmonary disease ([place]) 07/16/2022   Healthcare maintenance 03/19/2022   Obstructive sleep apnea 09/30/2021   Restless leg syndrome 09/30/2021   Elevated ferritin level 09/30/2021   Cellulitis 05/19/2019   Hypokalemia 05/19/2019   Polyp of transverse colon    Trochanteric bursitis of left hip 03/11/2019   Osteoarthritis of knee 03/11/2019   Cigarette nicotine dependence without complication [id number]   Chronic insomnia 11/04/2018   Essential hypertension 03/24/2018   HIV (human immunodeficiency virus infection) ([place]) 03/03/2018   Acute bronchitis with COPD ([place]) 03/03/2018    Patient's Medications  New Prescriptions   No medications on file  Previous Medications   ALBUTEROL (VENTOLIN HFA) 108 (90 BASE) MCG/ACT INHALER    TAKE 2 PUFFS BY MOUTH EVERY 6 HOURS AS NEEDED FOR WHEEZE OR SHORTNESS OF BREATH   AMLODIPINE (NORVASC) 10 MG TABLET    Take 1 tablet (10 mg total) by mouth daily.   ESCITALOPRAM (LEXAPRO) 20 MG TABLET    Take 1 tablet (20 mg total) by mouth daily.   FAMCICLOVIR (FAMVIR) 500 MG TABLET    TAKE 1 TABLET BY MOUTH TWICE DAILY DURING A FLARE UP/OUTBREAK UNTIL RESOLVED.   FLUTICASONE (FLONASE) 50 MCG/ACT NASAL SPRAY    Place 1 spray into both nostrils daily.   LISINOPRIL-HYDROCHLOROTHIAZIDE (ZESTORETIC) 20-12.5 MG TABLET    Take 1 tablet by mouth daily.   METHOCARBAMOL (ROBAXIN-750) 750 MG TABLET    Take 1 tablet (750 mg total) by mouth every 6 (six) hours as needed.   PRAMIPEXOLE (MIRAPEX) 0.5 MG TABLET    Take 1 tablet (0.5 mg total) by mouth at bedtime. Take 2-3 hours prior to going to bed.  Modified Medications   No medications on file  Discontinued Medications   No medications on file    Allergies: Allergies   Allergen Reactions   Other     Dust mites and roaches     Past Medical History: Past Medical History:  Diagnosis Date   Allergy    Colon polyps    COPD (chronic obstructive pulmonary disease) ([place])    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   Genital warts    HIV (human immunodeficiency virus infection) ([place])    Hypertension    Sleep apnea     Social History: Social History   Socioeconomic History   Marital status: Divorced    Spouse name: Not on file   Number of children: Not on file   Years of education: Not on file   Highest education level: Not on file  Occupational History   Not on file  Tobacco Use   Smoking status: Every Day    Packs/day: 1.50    Years: 30.00    Additional pack years: 0.00    Total pack years: 45.00    Types: Cigarettes   Smokeless tobacco: Never   Tobacco comments:    1.5 packs daily  Vaping Use   Vaping Use: Never used  Substance and Sexual Activity   Alcohol use: Yes    Alcohol/week: 21.0 standard drinks of alcohol    Types: 21 Shots of liquor per week    Comment: weekends   Drug use: Never   Sexual activity: Not Currently  Other Topics Concern   Not  on file  Social History Narrative   Not on file   Social Determinants of Health   Financial Resource Strain: Not on file  Food Insecurity: Not on file  Transportation Needs: Not on file  Physical Activity: Not on file  Stress: Not on file  Social Connections: Not on file    Labs: Lab Results  Component Value Date   HIV1RNAQUANT Not Detected 06/20/2022   HIV1RNAQUANT Not Detected 03/19/2022   HIV1RNAQUANT 43 (H) 03/05/2022   CD4TABS 562 03/19/2022   CD4TABS 768 03/05/2022    RPR and STI Lab Results  Component Value Date   LABRPR NON-REACTIVE 03/19/2022   LABRPR NON-REACTIVE 03/05/2022    STI Results GC CT  03/05/2022  9:44 AM Negative  Negative     Hepatitis B Lab Results  Component Value Date   HEPBSAB REACTIVE (A) 03/05/2022   HEPBSAG NON-REACTIVE  03/05/2022   HEPBCAB NON-REACTIVE 03/05/2022   Hepatitis C Lab Results  Component Value Date   HEPCAB NON-REACTIVE 03/05/2022   Hepatitis A Lab Results  Component Value Date   HAV REACTIVE (A) 03/05/2022   Lipids: Lab Results  Component Value Date   CHOL 225 (H) 03/19/2022   TRIG 1,760 (H) 03/19/2022   HDL 16 (L) 03/19/2022   CHOLHDL 14.1 (H) 03/19/2022   LDLCALC  03/19/2022     Comment:     . LDL cholesterol not calculated. Triglyceride levels greater than 400 mg/dL invalidate calculated LDL results. . Reference range: <100 . Desirable range <100 mg/dL for primary prevention;   <70 mg/dL for patients with CHD or diabetic patients  with > or = 2 CHD risk factors. Marland Kitchen LDL-C is now calculated using the Martin-Hopkins  calculation, which is a validated novel method providing  better accuracy than the Friedewald equation in the  estimation of LDL-C.  Cresenciano Genre et al. Annamaria Helling. [id number]): 2061-2068  (http://education.[employer].com/faq/FAQ164)     TARGET DATE: The 29th   Assessment: [name] presents today for his second set of Cabenuva injections. He will transition to every 2 month dosing after today's appointment. Past injections were tolerated well without issues. Will get an HIV RNA today. He reports no recent signs/symptoms of an STI and politely declines STI testing today.   The patient appears to be eligible for the PCV20 vaccine but reports receiving a pneumonia vaccine in the past (~4-5 years ago). He will look at his vaccine records to clarify and we will address this again at his next appointment.   Administered cabotegravir 600mg /2mL in left upper outer quadrant of the gluteal muscle. Administered rilpivirine 900 mg/54mL in the right upper outer quadrant of the gluteal muscle. No issues with injections. He will follow up in 2 months for next set of injections.  Plan: - Cabenuva injections administered - Get HIV RNA - Next injections scheduled for 09/13/22  with Marya Amsler and 11/14/22 with Cassie  - Call with any issues or questions   Billey Gosling, PharmD PGY1 Pharmacy Resident 3/27/202410:47 AM

## 2022-07-19 DIAGNOSIS — M5416 Radiculopathy, lumbar region: Secondary | ICD-10-CM | POA: Diagnosis not present

## 2022-07-20 LAB — HIV-1 RNA QUANT-NO REFLEX-BLD
HIV 1 RNA Quant: <20 Copies/mL — ABNORMAL HIGH
HIV-1 RNA Quant, Log: <1.3 Log cps/mL — ABNORMAL HIGH

## 2022-07-23 ENCOUNTER — Other Ambulatory Visit: Payer: BC Managed Care – PPO

## 2022-07-29 DIAGNOSIS — M5416 Radiculopathy, lumbar region: Secondary | ICD-10-CM | POA: Diagnosis not present

## 2022-08-07 DIAGNOSIS — M5416 Radiculopathy, lumbar region: Secondary | ICD-10-CM | POA: Diagnosis not present

## 2022-08-19 ENCOUNTER — Encounter: Payer: BC Managed Care – PPO | Admitting: Nurse Practitioner

## 2022-08-27 ENCOUNTER — Other Ambulatory Visit: Payer: BC Managed Care – PPO

## 2022-08-27 ENCOUNTER — Other Ambulatory Visit: Payer: Self-pay

## 2022-08-27 ENCOUNTER — Other Ambulatory Visit (HOSPITAL_COMMUNITY)
Admission: RE | Admit: 2022-08-27 | Discharge: 2022-08-27 | Disposition: A | Discharge status: Home / Self Care | Payer: BC Managed Care – PPO | Source: Ambulatory Visit | Attending: Family | Admitting: Family

## 2022-08-27 DIAGNOSIS — Z79899 Other long term (current) drug therapy: Secondary | ICD-10-CM

## 2022-08-27 DIAGNOSIS — Z113 Encounter for screening for infections with a predominantly sexual mode of transmission: Secondary | ICD-10-CM | POA: Insufficient documentation

## 2022-08-27 DIAGNOSIS — Z21 Asymptomatic human immunodeficiency virus [HIV] infection status: Secondary | ICD-10-CM

## 2022-08-27 LAB — CBC WITH DIFFERENTIAL/PLATELET
MCV: 96.2 fL (ref 80.0–100.0)
Monocytes Relative: 5.7 %
Platelets: 303 10*3/uL (ref 140–400)
RBC: 5.25 10*6/uL (ref 4.20–5.80)
RDW: 13.2 % (ref 11.0–15.0)
Total Lymphocyte: 18 %

## 2022-08-28 LAB — URINE CYTOLOGY ANCILLARY ONLY
Chlamydia: NEGATIVE
Comment: NEGATIVE
Comment: NORMAL
Neisseria Gonorrhea: NEGATIVE

## 2022-08-28 LAB — T-HELPER CELL (CD4) - (RCID CLINIC ONLY)
CD4 % Helper T Cell: 34 % (ref 33–65)
CD4 T Cell Abs: 865 /uL (ref 400–1790)

## 2022-08-29 ENCOUNTER — Telehealth: Payer: Self-pay | Admitting: Nurse Practitioner

## 2022-08-29 NOTE — Telephone Encounter (Signed)
04/16/22 office notes faxed to April Varley-Nichols with [employer]; [phone]

## 2022-08-30 LAB — COMPLETE METABOLIC PANEL WITH GFR
AG Ratio: 1.5 (calc) (ref 1.0–2.5)
ALT: 34 U/L (ref 9–46)
AST: 40 U/L — ABNORMAL HIGH (ref 10–35)
Albumin: 4.5 g/dL (ref 3.6–5.1)
Alkaline phosphatase (APISO): 70 U/L (ref 35–144)
BUN: 22 mg/dL (ref 7–25)
CO2: 18 mmol/L — ABNORMAL LOW (ref 20–32)
Calcium: 9.3 mg/dL (ref 8.6–10.3)
Chloride: 95 mmol/L — ABNORMAL LOW (ref 98–110)
Creat: 1.08 mg/dL (ref 0.70–1.30)
Globulin: 3 g/dL (calc) (ref 1.9–3.7)
Glucose, Bld: 53 mg/dL — ABNORMAL LOW (ref 65–99)
Potassium: 4.1 mmol/L (ref 3.5–5.3)
Sodium: 135 mmol/L (ref 135–146)
Total Bilirubin: 0.8 mg/dL (ref 0.2–1.2)
Total Protein: 7.5 g/dL (ref 6.1–8.1)
eGFR: 82 mL/min/{1.73_m2} (ref >=60)

## 2022-08-30 LAB — CBC WITH DIFFERENTIAL/PLATELET
Absolute Monocytes: 958 cells/uL — ABNORMAL HIGH (ref 200–950)
Basophils Absolute: 118 cells/uL (ref 0–200)
Basophils Relative: 0.7 %
Eosinophils Absolute: 101 cells/uL (ref 15–500)
Eosinophils Relative: 0.6 %
HCT: 50.5 % — ABNORMAL HIGH (ref 38.5–50.0)
Hemoglobin: 17.6 g/dL — ABNORMAL HIGH (ref 13.2–17.1)
Lymphs Abs: 3024 cells/uL (ref 850–3900)
MCH: 33.5 pg — ABNORMAL HIGH (ref 27.0–33.0)
MCHC: 34.9 g/dL (ref 32.0–36.0)
MPV: 10.3 fL (ref 7.5–12.5)
Neutro Abs: 12600 cells/uL — ABNORMAL HIGH (ref 1500–7800)
Neutrophils Relative %: 75 %
WBC: 16.8 10*3/uL — ABNORMAL HIGH (ref 3.8–10.8)

## 2022-08-30 LAB — HIV-1 RNA QUANT-NO REFLEX-BLD
HIV 1 RNA Quant: 22 Copies/mL — ABNORMAL HIGH
HIV-1 RNA Quant, Log: 1.33 Log cps/mL — ABNORMAL HIGH

## 2022-08-30 LAB — RPR: RPR Ser Ql: NONREACTIVE

## 2022-09-06 DIAGNOSIS — M5416 Radiculopathy, lumbar region: Secondary | ICD-10-CM | POA: Diagnosis not present

## 2022-09-10 ENCOUNTER — Ambulatory Visit: Payer: Self-pay | Admitting: Family

## 2022-09-13 ENCOUNTER — Encounter: Payer: Self-pay | Admitting: Family

## 2022-09-13 ENCOUNTER — Ambulatory Visit: Payer: BC Managed Care – PPO | Admitting: Family

## 2022-09-13 ENCOUNTER — Other Ambulatory Visit: Payer: Self-pay

## 2022-09-13 VITALS — BP 139/87 | HR 75 | Temp 97.7°F | Wt 181.0 lb

## 2022-09-13 DIAGNOSIS — Z0000 Encounter for general adult medical examination without abnormal findings: Secondary | ICD-10-CM

## 2022-09-13 DIAGNOSIS — Z21 Asymptomatic human immunodeficiency virus [HIV] infection status: Secondary | ICD-10-CM

## 2022-09-13 MED ORDER — ALBUTEROL SULFATE HFA 108 (90 BASE) MCG/ACT IN AERS: INHALATION_SPRAY | RESPIRATORY_TRACT | 2 refills | Status: AC

## 2022-09-13 MED ORDER — CABOTEGRAVIR & RILPIVIRINE ER 600 & 900 MG/3ML IM SUER
1.0000 | Freq: Once | INTRAMUSCULAR | Status: AC
Start: 2022-09-13 — End: 2022-09-13
  Administered 2022-09-13: 1 via INTRAMUSCULAR

## 2022-09-13 NOTE — Patient Instructions (Signed)
Nice to see you.  Plan for follow up in 2 months or sooner if needed with lab work on the same day.  Have a great day and stay safe!  

## 2022-09-13 NOTE — Assessment & Plan Note (Signed)
[name] continues to have well controlled virus with good adherence and tolerance to [place]. Reviewed lab work and discussed plan of care and U equals U. Continue current dose of q 2 month Cabenuva. Injections provided without complications. Plan for follow up in 2 months or sooner if needed with Pharmacy provider and 6 months with NP/MD.

## 2022-09-13 NOTE — Progress Notes (Signed)
Brief Narrative   Patient ID: [name], male    DOB: 12/15/1965, 57 y.o.   MRN: [id number]  [name] is a 57 y/o caucasian male diagnosed with HIV-1 disease in 2007 with CD4 count <100 and viral load >1 million. No genosure available. Entered care at [facility] Stage 3. [id number] negative. ART experienced with Abavavir/lamivudine and raltegravir, Triumeq and Biktarvy   Subjective:    Chief Complaint  Patient presents with   HIV Positive/AIDS    HPI:  [name] is a 57 y.o. male with HIV disease last seen on 03/19/22 with well controlled virus and good adherence and tolerance to [employer]. Has since been transitioned to [place] with last injection provided on 07/17/22 with lab work showing undetectable viral load. Most recent lab work on 08/27/22 with undetectable viral load and CD4 count of 865. WBC count elevated at 16. Here today for follow up and next injection.  [name] has been doing okay since last office visit and has been dealing with back pain and pinched nerve and currently seeing orthopedics. Has been on steroids recently which likely increased his WBC count. Tolerating Cabenuva with no adverse side effects. Condoms and STD testing offered. Routine vaccinations up to date and including colon cancer screening and routine dental care.   Denies fevers, chills, night sweats, headaches, changes in vision, neck pain/stiffness, nausea, diarrhea, vomiting, lesions or rashes.   Allergies  Allergen Reactions   Other     Dust mites and roaches       Outpatient Medications Prior to Visit  Medication Sig Dispense Refill   amLODipine (NORVASC) 10 MG tablet Take 1 tablet (10 mg total) by mouth daily. 90 tablet 0   escitalopram (LEXAPRO) 20 MG tablet Take 1 tablet (20 mg total) by mouth daily. 90 tablet 1   famciclovir (FAMVIR) 500 MG tablet TAKE 1 TABLET BY MOUTH TWICE DAILY DURING A FLARE UP/OUTBREAK UNTIL RESOLVED. 60 tablet 3   fluticasone (FLONASE) 50 MCG/ACT nasal spray Place 1  spray into both nostrils daily.     lisinopril-hydrochlorothiazide (ZESTORETIC) 20-12.5 MG tablet Take 1 tablet by mouth daily. 90 tablet 3   methocarbamol (ROBAXIN-750) 750 MG tablet Take 1 tablet (750 mg total) by mouth every 6 (six) hours as needed. 60 tablet 1   pramipexole (MIRAPEX) 0.5 MG tablet Take 1 tablet (0.5 mg total) by mouth at bedtime. Take 2-3 hours prior to going to bed. 90 tablet 0   albuterol (VENTOLIN HFA) 108 (90 Base) MCG/ACT inhaler TAKE 2 PUFFS BY MOUTH EVERY 6 HOURS AS NEEDED FOR WHEEZE OR SHORTNESS OF BREATH 8.5 each 2   No facility-administered medications prior to visit.     Past Medical History:  Diagnosis Date   Allergy    Colon polyps    COPD (chronic obstructive pulmonary disease) (HCC)    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   Genital warts    HIV (human immunodeficiency virus infection) (HCC)    Hypertension    Sleep apnea      Past Surgical History:  Procedure Laterality Date   COLONOSCOPY WITH PROPOFOL N/A 03/22/2019   Procedure: COLONOSCOPY WITH BIOPSIES;  Surgeon: Midge Minium, MD;  Location: [facility];  Service: Endoscopy;  Laterality: N/A;   I & D EXTREMITY Left 05/21/2019   Procedure: IRRIGATION AND DEBRIDEMENT LEFT LEG;  Surgeon: Carolan Shiver, MD;  Location: [facility];  Service: General;  Laterality: Left;   I & D EXTREMITY Left 05/25/2019  Procedure: IRRIGATION AND DEBRIDEMENT Left Leg;  Surgeon: Carolan Shiver, MD;  Location: [facility];  Service: General;  Laterality: Left;   POLYPECTOMY N/A 03/22/2019   Procedure: POLYPECTOMY;  Surgeon: Midge Minium, MD;  Location: [facility];  Service: Endoscopy;  Laterality: N/A;      Review of Systems  Constitutional:  Negative for appetite change, chills, fatigue, fever and unexpected weight change.  Eyes:  Negative for visual disturbance.  Respiratory:  Negative for cough, chest tightness, shortness of breath and wheezing.   Cardiovascular:  Negative for  chest pain and leg swelling.  Gastrointestinal:  Negative for abdominal pain, constipation, diarrhea, nausea and vomiting.  Genitourinary:  Negative for dysuria, flank pain, frequency, genital sores, hematuria and urgency.  Musculoskeletal:  Positive for back pain.  Skin:  Negative for rash.  Allergic/Immunologic: Negative for immunocompromised state.  Neurological:  Negative for dizziness and headaches.      Objective:    BP 139/87   Pulse 75   Temp 97.7 F (36.5 C)   Wt 181 lb (82.1 kg)   SpO2 100%   BMI 26.73 kg/m  Nursing note and vital signs reviewed.  Physical Exam Constitutional:      General: He is not in acute distress.    Appearance: He is well-developed.  Eyes:     Conjunctiva/sclera: Conjunctivae normal.  Cardiovascular:     Rate and Rhythm: Normal rate and regular rhythm.     Heart sounds: Normal heart sounds. No murmur heard.    No friction rub. No gallop.  Pulmonary:     Effort: Pulmonary effort is normal. No respiratory distress.     Breath sounds: Normal breath sounds. No wheezing or rales.  Chest:     Chest wall: No tenderness.  Abdominal:     General: Bowel sounds are normal.     Palpations: Abdomen is soft.     Tenderness: There is no abdominal tenderness.  Musculoskeletal:     Cervical back: Neck supple.  Lymphadenopathy:     Cervical: No cervical adenopathy.  Skin:    General: Skin is warm and dry.     Findings: No rash.  Neurological:     Mental Status: He is alert and oriented to person, place, and time.  Psychiatric:        Behavior: Behavior normal.        Thought Content: Thought content normal.        Judgment: Judgment normal.         07/16/2022    9:07 AM 04/16/2022    9:40 AM 03/19/2022    8:58 AM 01/15/2022   10:54 AM 10/16/2021    9:48 AM  Depression screen PHQ 2/9  Decreased Interest 1 0 0 0 0  Down, Depressed, Hopeless 0 0 0 0 0  PHQ - 2 Score 1 0 0 0 0  Altered sleeping 2      Tired, decreased energy 1      Change  in appetite 0      Feeling bad or failure about yourself  0      Trouble concentrating 1      Moving slowly or fidgety/restless 0      Suicidal thoughts 0      PHQ-9 Score 5      Difficult doing work/chores Not difficult at all           Assessment & Plan:    Patient Active Problem List   Diagnosis Date Noted   Hypertriglyceridemia 07/16/2022  Lumbar radiculopathy 07/16/2022   Chronic obstructive pulmonary disease (HCC) 07/16/2022   Healthcare maintenance 03/19/2022   Obstructive sleep apnea 09/30/2021   Restless leg syndrome 09/30/2021   Elevated ferritin level 09/30/2021   Cellulitis 05/19/2019   Hypokalemia 05/19/2019   Polyp of transverse colon    Trochanteric bursitis of left hip 03/11/2019   Osteoarthritis of knee 03/11/2019   Cigarette nicotine dependence without complication 11/04/2018   Chronic insomnia 11/04/2018   Essential hypertension 03/24/2018   HIV (human immunodeficiency virus infection) (HCC) 03/03/2018   Acute bronchitis with COPD (HCC) 03/03/2018     Problem List Items Addressed This Visit       Other   HIV (human immunodeficiency virus infection) (HCC) - Primary    Fonzo continues to have well controlled virus with good adherence and tolerance to [place]. Reviewed lab work and discussed plan of care and U equals U. Continue current dose of q 2 month Cabenuva. Injections provided without complications. Plan for follow up in 2 months or sooner if needed with Pharmacy provider and 6 months with NP/MD.       Healthcare maintenance    Discussed importance of safe sexual practice and condom use. Condoms and STD testing offered.  Vaccinations up to date Routine dental care up to date Colon cancer screening up to date.         I am having Weldon Inches maintain his fluticasone, famciclovir, escitalopram, pramipexole, amLODipine, lisinopril-hydrochlorothiazide, methocarbamol, and albuterol. We administered cabotegravir & rilpivirine ER.   Meds ordered  this encounter  Medications   cabotegravir & rilpivirine ER (CABENUVA) 600 & 900 MG/3ML injection 1 kit   albuterol (VENTOLIN HFA) 108 (90 Base) MCG/ACT inhaler    Sig: TAKE 2 PUFFS BY MOUTH EVERY 6 HOURS AS NEEDED FOR WHEEZE OR SHORTNESS OF BREATH    Dispense:  18 each    Refill:  2    Order Specific Question:   Supervising Provider    Answer:   Judyann Munson [[id number]]     Follow-up: Return in about 2 months (around 11/13/2022), or if symptoms worsen or fail to improve.   Marcos Eke, MSN, FNP-C Nurse Practitioner [facility] for Infectious Disease [facility] Main number: [phone]

## 2022-09-13 NOTE — Assessment & Plan Note (Signed)
Discussed importance of safe sexual practice and condom use. Condoms and STD testing offered.  Vaccinations up to date Routine dental care up to date Colon cancer screening up to date.

## 2022-09-17 ENCOUNTER — Ambulatory Visit: Payer: BC Managed Care – PPO | Admitting: Pharmacist

## 2022-09-20 DIAGNOSIS — M5416 Radiculopathy, lumbar region: Secondary | ICD-10-CM | POA: Diagnosis not present

## 2022-09-26 ENCOUNTER — Other Ambulatory Visit: Payer: Self-pay | Admitting: Nurse Practitioner

## 2022-09-26 DIAGNOSIS — M5416 Radiculopathy, lumbar region: Secondary | ICD-10-CM

## 2022-10-06 ENCOUNTER — Other Ambulatory Visit: Payer: Self-pay | Admitting: Nurse Practitioner

## 2022-10-06 DIAGNOSIS — G2581 Restless legs syndrome: Secondary | ICD-10-CM

## 2022-10-16 ENCOUNTER — Encounter: Payer: Self-pay | Admitting: Nurse Practitioner

## 2022-10-16 ENCOUNTER — Ambulatory Visit: Payer: BC Managed Care – PPO | Admitting: Nurse Practitioner

## 2022-10-16 VITALS — BP 110/70 | HR 77 | Temp 99.1°F | Ht 69.0 in | Wt 180.0 lb

## 2022-10-16 DIAGNOSIS — E781 Pure hyperglyceridemia: Secondary | ICD-10-CM | POA: Diagnosis not present

## 2022-10-16 DIAGNOSIS — I10 Essential (primary) hypertension: Secondary | ICD-10-CM | POA: Diagnosis not present

## 2022-10-16 DIAGNOSIS — R7989 Other specified abnormal findings of blood chemistry: Secondary | ICD-10-CM

## 2022-10-16 DIAGNOSIS — G4733 Obstructive sleep apnea (adult) (pediatric): Secondary | ICD-10-CM

## 2022-10-16 DIAGNOSIS — Z125 Encounter for screening for malignant neoplasm of prostate: Secondary | ICD-10-CM

## 2022-10-16 DIAGNOSIS — Z1329 Encounter for screening for other suspected endocrine disorder: Secondary | ICD-10-CM

## 2022-10-16 DIAGNOSIS — B20 Human immunodeficiency virus [HIV] disease: Secondary | ICD-10-CM | POA: Diagnosis not present

## 2022-10-16 DIAGNOSIS — M5416 Radiculopathy, lumbar region: Secondary | ICD-10-CM

## 2022-10-16 DIAGNOSIS — J432 Centrilobular emphysema: Secondary | ICD-10-CM

## 2022-10-16 LAB — CBC WITH DIFFERENTIAL/PLATELET
Basophils Absolute: 0.1 10*3/uL (ref 0.0–0.1)
Basophils Relative: 1.3 % (ref 0.0–3.0)
Eosinophils Absolute: 0.3 10*3/uL (ref 0.0–0.7)
Eosinophils Relative: 4.2 % (ref 0.0–5.0)
HCT: 43.6 % (ref 39.0–52.0)
Hemoglobin: 14.6 g/dL (ref 13.0–17.0)
Lymphocytes Relative: 35.2 % (ref 12.0–46.0)
Lymphs Abs: 2.6 10*3/uL (ref 0.7–4.0)
MCHC: 33.6 g/dL (ref 30.0–36.0)
MCV: 97.2 fl (ref 78.0–100.0)
Monocytes Absolute: 0.7 10*3/uL (ref 0.1–1.0)
Monocytes Relative: 9.7 % (ref 3.0–12.0)
Neutro Abs: 3.7 10*3/uL (ref 1.4–7.7)
Neutrophils Relative %: 49.6 % (ref 43.0–77.0)
Platelets: 300 10*3/uL (ref 150.0–400.0)
RBC: 4.48 Mil/uL (ref 4.22–5.81)
RDW: 13.6 % (ref 11.5–15.5)
WBC: 7.4 10*3/uL (ref 4.0–10.5)

## 2022-10-16 LAB — IBC + FERRITIN
Ferritin: 311.4 ng/mL (ref 22.0–322.0)
Iron: 105 ug/dL (ref 42–165)
Saturation Ratios: 29.8 % (ref 20.0–50.0)
TIBC: 352.8 ug/dL (ref 250.0–450.0)
Transferrin: 252 mg/dL (ref 212.0–360.0)

## 2022-10-16 LAB — COMPREHENSIVE METABOLIC PANEL
ALT: 29 U/L (ref 0–53)
AST: 38 U/L — ABNORMAL HIGH (ref 0–37)
Albumin: 4.2 g/dL (ref 3.5–5.2)
Alkaline Phosphatase: 62 U/L (ref 39–117)
BUN: 16 mg/dL (ref 6–23)
CO2: 28 mEq/L (ref 19–32)
Calcium: 9.3 mg/dL (ref 8.4–10.5)
Chloride: 97 mEq/L (ref 96–112)
Creatinine, Ser: 1.19 mg/dL (ref 0.40–1.50)
GFR: 69.07 mL/min (ref >60.00)
Glucose, Bld: 92 mg/dL (ref 70–99)
Potassium: 4.1 mEq/L (ref 3.5–5.1)
Sodium: 132 mEq/L — ABNORMAL LOW (ref 135–145)
Total Bilirubin: 0.9 mg/dL (ref 0.2–1.2)
Total Protein: 6.8 g/dL (ref 6.0–8.3)

## 2022-10-16 LAB — LIPID PANEL
Cholesterol: 165 mg/dL (ref 0–200)
HDL: 33.1 mg/dL — ABNORMAL LOW (ref >39.00)
Total CHOL/HDL Ratio: 5
Triglycerides: 667 mg/dL — ABNORMAL HIGH (ref 0.0–149.0)

## 2022-10-16 LAB — PSA: PSA: 0.8 ng/mL (ref 0.10–4.00)

## 2022-10-16 LAB — TSH: TSH: 3.22 u[IU]/mL (ref 0.35–5.50)

## 2022-10-16 LAB — LDL CHOLESTEROL, DIRECT: Direct LDL: 49 mg/dL

## 2022-10-16 NOTE — Progress Notes (Signed)
Bethanie Dicker, NP-C Phone: [phone]  [name] is a 57 y.o. male who presents today for follow up.   HYPERTENSION Disease Monitoring Home BP Monitoring- 117-130/80s Chest pain- No    Dyspnea- No Medications Compliance-  Norvasc, Lisinopril- HCTZ. Lightheadedness-  No  Edema- No BMET    Component Value Date/Time   NA 132 (L) 10/16/2022 0857   NA 136 10/19/2021 1039   K 4.1 10/16/2022 0857   CL 97 10/16/2022 0857   CO2 28 10/16/2022 0857   GLUCOSE 92 10/16/2022 0857   BUN 16 10/16/2022 0857   BUN 13 10/19/2021 1039   CREATININE 1.19 10/16/2022 0857   CREATININE 1.08 08/27/2022 1018   CALCIUM 9.3 10/16/2022 0857   GFRNONAA 80 07/29/2019 1418   GFRAA 92 07/29/2019 1418   Hypertriglyceridemia-  Symptoms Chest pain on exertion:  No   Leg claudication:   No Medications: Compliance- None Right upper quadrant pain- No  Muscle aches- N0 Lab Results  Component Value Date   TRIG (H) 10/16/2022    667.0 Triglyceride is over 400; calculations on Lipids are invalid.   TRIG 1,760 (H) 03/19/2022   TRIG 723 (H) 03/05/2022    COPD: Medication compliance- None  Rescue inhaler use- PRN, 1-2 times per week Dyspnea- No  Wheezing- Occasionally  Cough- Yes  Productive- No   Social History   Tobacco Use  Smoking Status Every Day   Packs/day: 1.50   Years: 30.00   Additional pack years: 0.00   Total pack years: 45.00   Types: Cigarettes  Smokeless Tobacco Never  Tobacco Comments   1.5 packs daily    Current Outpatient Medications on File Prior to Visit  Medication Sig Dispense Refill   albuterol (VENTOLIN HFA) 108 (90 Base) MCG/ACT inhaler TAKE 2 PUFFS BY MOUTH EVERY 6 HOURS AS NEEDED FOR WHEEZE OR SHORTNESS OF BREATH 18 each 2   amLODipine (NORVASC) 10 MG tablet Take 1 tablet (10 mg total) by mouth daily. 90 tablet 0   escitalopram (LEXAPRO) 20 MG tablet Take 1 tablet (20 mg total) by mouth daily. 90 tablet 1   famciclovir (FAMVIR) 500 MG tablet TAKE 1 TABLET BY MOUTH TWICE  DAILY DURING A FLARE UP/OUTBREAK UNTIL RESOLVED. 60 tablet 3   fluticasone (FLONASE) 50 MCG/ACT nasal spray Place 1 spray into both nostrils daily.     lisinopril-hydrochlorothiazide (ZESTORETIC) 20-12.5 MG tablet Take 1 tablet by mouth daily. 90 tablet 3   pramipexole (MIRAPEX) 0.5 MG tablet Take 1 tablet (0.5 mg total) by mouth at bedtime. Take 2-3 hours prior to going to bed. 90 tablet 0   No current facility-administered medications on file prior to visit.     ROS see history of present illness  Objective  Physical Exam Vitals:   10/16/22 0834  BP: 110/70  Pulse: 77  Temp: 99.1 F (37.3 C)  SpO2: 96%    BP Readings from Last 3 Encounters:  10/16/22 110/70  09/13/22 139/87  07/16/22 (!) 143/88   Wt Readings from Last 3 Encounters:  10/16/22 180 lb (81.6 kg)  09/13/22 181 lb (82.1 kg)  07/16/22 180 lb 12.8 oz (82 kg)    Physical Exam Constitutional:      General: He is not in acute distress.    Appearance: Normal appearance.  HENT:     Head: Normocephalic.  Cardiovascular:     Rate and Rhythm: Normal rate and regular rhythm.     Heart sounds: Normal heart sounds.  Pulmonary:     Effort: Pulmonary  effort is normal.     Breath sounds: Examination of the right-upper field reveals wheezing. Wheezing present.  Skin:    General: Skin is warm and dry.  Neurological:     General: No focal deficit present.     Mental Status: He is alert.  Psychiatric:        Mood and Affect: Mood normal.        Behavior: Behavior normal.    Assessment/Plan: Please see individual problem list.  Essential hypertension Assessment & Plan: Chronic. Stable on Norvasc 10 mg daily and Lisinopril - Hydrochlorothiazide 20-12.5 mg daily. Continue. Lab work as outlined.   Orders: -     CBC with Differential/Platelet -     Comprehensive metabolic panel  Hypertriglyceridemia Assessment & Plan: Chronic. Not currently on any medications. No longer taking fish oil. Will check lipids today.  Encouraged healthy diet and exercise.   Orders: -     Lipid panel -     LDL cholesterol, direct  HIV infection, unspecified symptom status (HCC) Assessment & Plan: Managed by ID. Continue current dose of every 2 month Cabenuva injections. Encouraged to follow up as scheduled.    Centrilobular emphysema (HCC) Assessment & Plan: Patient continues to smoke 1.5 packs per day. He is not interested in quitting at this time. Counseled on smoking cessation. Using albuterol inhaler PRN approximately 1-2 times per week. Denies shortness of breath. Mild wheezing noted on exam. Due for CT Chest Lung Cancer Screening, order placed. Will continue to monitor.   Orders: -     Ambulatory Referral for Lung Cancer Scre  Obstructive sleep apnea Assessment & Plan: Not wearing CPAP. Seeing Pulmonology, encouraged to follow up as scheduled.   Lumbar radiculopathy Assessment & Plan: Followed by Orthopedics. Discontinued Robaxin and started on Zanaflex 4 mg Q6 PRN by Ortho at last office visit. Patient reports taking mostly at bedtime only. Completed physical therapy. Doing at home exercises and stretches. Encouraged follow up as scheduled.   Elevated ferritin level Assessment & Plan: Noted in lab work last year. Has not had rechecked. Will check iron panel today.  Orders: -     IBC + Ferritin  Thyroid disorder screen -     TSH  Screening PSA (prostate specific antigen) -     PSA   Return in about 6 months (around 04/17/2023) for Follow up.   Bethanie Dicker, NP-C [place] [facility] - [employer]

## 2022-10-22 ENCOUNTER — Telehealth: Payer: Self-pay

## 2022-10-22 ENCOUNTER — Encounter: Payer: Self-pay | Admitting: Nurse Practitioner

## 2022-10-22 NOTE — Assessment & Plan Note (Signed)
Chronic. Stable on Norvasc 10 mg daily and Lisinopril - Hydrochlorothiazide 20-12.5 mg daily. Continue. Lab work as outlined.

## 2022-10-22 NOTE — Assessment & Plan Note (Addendum)
Noted in lab work last year. Has not had rechecked. Will check iron panel today.

## 2022-10-22 NOTE — Assessment & Plan Note (Addendum)
Managed by ID. Continue current dose of every 2 month Cabenuva injections. Encouraged to follow up as scheduled.

## 2022-10-22 NOTE — Assessment & Plan Note (Signed)
Followed by Orthopedics. Discontinued Robaxin and started on Zanaflex 4 mg Q6 PRN by Ortho at last office visit. Patient reports taking mostly at bedtime only. Completed physical therapy. Doing at home exercises and stretches. Encouraged follow up as scheduled.

## 2022-10-22 NOTE — Assessment & Plan Note (Signed)
Chronic. Not currently on any medications. No longer taking fish oil. Will check lipids today. Encouraged healthy diet and exercise.

## 2022-10-22 NOTE — Telephone Encounter (Signed)
Called and LMOM to CB.

## 2022-10-22 NOTE — Assessment & Plan Note (Addendum)
Patient continues to smoke 1.5 packs per day. He is not interested in quitting at this time. Counseled on smoking cessation. Using albuterol inhaler PRN approximately 1-2 times per week. Denies shortness of breath. Mild wheezing noted on exam. Due for CT Chest Lung Cancer Screening, order placed. Will continue to monitor.

## 2022-10-22 NOTE — Assessment & Plan Note (Signed)
Not wearing CPAP. Seeing Pulmonology, encouraged to follow up as scheduled.

## 2022-10-23 ENCOUNTER — Other Ambulatory Visit: Payer: Self-pay | Admitting: Nurse Practitioner

## 2022-10-23 DIAGNOSIS — E781 Pure hyperglyceridemia: Secondary | ICD-10-CM

## 2022-10-23 MED ORDER — FENOFIBRATE 145 MG PO TABS
145.0000 mg | ORAL_TABLET | Freq: Every day | ORAL | 3 refills | Status: AC
Start: 2022-10-23 — End: ?

## 2022-10-23 NOTE — Telephone Encounter (Signed)
Pt returned [facility] CMA call. Unable to transfer.

## 2022-10-23 NOTE — Telephone Encounter (Signed)
Called pt back and was able to speak to him notes have been added to lab result.

## 2022-10-23 NOTE — Telephone Encounter (Signed)
Called pt for a 2nd attempt, did not leave a second vm

## 2022-10-29 NOTE — Progress Notes (Deleted)
  Bethanie Dicker, NP-C Phone: [phone]  [name] is a 57 y.o. male who presents today for ***  ***  Social History   Tobacco Use  Smoking Status Every Day   Packs/day: 1.50   Years: 30.00   Additional pack years: 0.00   Total pack years: 45.00   Types: Cigarettes  Smokeless Tobacco Never  Tobacco Comments   1.5 packs daily    Current Outpatient Medications on File Prior to Visit  Medication Sig Dispense Refill   albuterol (VENTOLIN HFA) 108 (90 Base) MCG/ACT inhaler TAKE 2 PUFFS BY MOUTH EVERY 6 HOURS AS NEEDED FOR WHEEZE OR SHORTNESS OF BREATH 18 each 2   amLODipine (NORVASC) 10 MG tablet Take 1 tablet (10 mg total) by mouth daily. 90 tablet 0   escitalopram (LEXAPRO) 20 MG tablet Take 1 tablet (20 mg total) by mouth daily. 90 tablet 1   famciclovir (FAMVIR) 500 MG tablet TAKE 1 TABLET BY MOUTH TWICE DAILY DURING A FLARE UP/OUTBREAK UNTIL RESOLVED. 60 tablet 3   fenofibrate (TRICOR) 145 MG tablet Take 1 tablet (145 mg total) by mouth daily. 90 tablet 3   fluticasone (FLONASE) 50 MCG/ACT nasal spray Place 1 spray into both nostrils daily.     lisinopril-hydrochlorothiazide (ZESTORETIC) 20-12.5 MG tablet Take 1 tablet by mouth daily. 90 tablet 3   pramipexole (MIRAPEX) 0.5 MG tablet Take 1 tablet (0.5 mg total) by mouth at bedtime. Take 2-3 hours prior to going to bed. 90 tablet 0   No current facility-administered medications on file prior to visit.     ROS see history of present illness  Objective  Physical Exam There were no vitals filed for this visit.  BP Readings from Last 3 Encounters:  10/16/22 110/70  09/13/22 139/87  07/16/22 (!) 143/88   Wt Readings from Last 3 Encounters:  10/16/22 180 lb (81.6 kg)  09/13/22 181 lb (82.1 kg)  07/16/22 180 lb 12.8 oz (82 kg)    Physical Exam   Assessment/Plan: Please see individual problem list.  There are no diagnoses linked to this encounter.   Health Maintenance: ***  No follow-ups on file.   Bethanie Dicker, NP-C  Primary Care - [employer]

## 2022-10-30 ENCOUNTER — Ambulatory Visit: Payer: BC Managed Care – PPO | Admitting: Nurse Practitioner

## 2022-11-01 ENCOUNTER — Other Ambulatory Visit: Payer: Self-pay | Admitting: Nurse Practitioner

## 2022-11-14 ENCOUNTER — Ambulatory Visit (INDEPENDENT_AMBULATORY_CARE_PROVIDER_SITE_OTHER): Payer: BC Managed Care – PPO | Admitting: Pharmacist

## 2022-11-14 ENCOUNTER — Other Ambulatory Visit: Payer: Self-pay

## 2022-11-14 DIAGNOSIS — Z21 Asymptomatic human immunodeficiency virus [HIV] infection status: Secondary | ICD-10-CM

## 2022-11-14 MED ORDER — CABOTEGRAVIR & RILPIVIRINE ER 600 & 900 MG/3ML IM SUER
1.0000 | Freq: Once | INTRAMUSCULAR | Status: AC
Start: 2022-11-14 — End: 2022-11-14
  Administered 2022-11-14: 1 via INTRAMUSCULAR

## 2022-11-14 NOTE — Progress Notes (Signed)
HPI: [name] is a 57 y.o. male who presents to the [facility] pharmacy clinic for [place] administration.  Patient Active Problem List   Diagnosis Date Noted   Hypertriglyceridemia 07/16/2022   Lumbar radiculopathy 07/16/2022   Chronic obstructive pulmonary disease (HCC) 07/16/2022   Healthcare maintenance 03/19/2022   Obstructive sleep apnea 09/30/2021   Restless leg syndrome 09/30/2021   Elevated ferritin level 09/30/2021   Hypokalemia 05/19/2019   Polyp of transverse colon    Trochanteric bursitis of left hip 03/11/2019   Osteoarthritis of knee 03/11/2019   Cigarette nicotine dependence without complication 11/04/2018   Chronic insomnia 11/04/2018   Essential hypertension 03/24/2018   HIV (human immunodeficiency virus infection) (HCC) 03/03/2018    Patient's Medications  New Prescriptions   No medications on file  Previous Medications   ALBUTEROL (VENTOLIN HFA) 108 (90 BASE) MCG/ACT INHALER    TAKE 2 PUFFS BY MOUTH EVERY 6 HOURS AS NEEDED FOR WHEEZE OR SHORTNESS OF BREATH   AMLODIPINE (NORVASC) 10 MG TABLET    Take 1 tablet (10 mg total) by mouth daily.   ESCITALOPRAM (LEXAPRO) 20 MG TABLET    Take 1 tablet (20 mg total) by mouth daily.   FAMCICLOVIR (FAMVIR) 500 MG TABLET    TAKE 1 TABLET BY MOUTH TWICE DAILY DURING A FLARE UP/OUTBREAK UNTIL RESOLVED.   FENOFIBRATE (TRICOR) 145 MG TABLET    Take 1 tablet (145 mg total) by mouth daily.   FLUTICASONE (FLONASE) 50 MCG/ACT NASAL SPRAY    Place 1 spray into both nostrils daily.   LISINOPRIL-HYDROCHLOROTHIAZIDE (ZESTORETIC) 20-12.5 MG TABLET    Take 1 tablet by mouth daily.   PRAMIPEXOLE (MIRAPEX) 0.5 MG TABLET    Take 1 tablet (0.5 mg total) by mouth at bedtime. Take 2-3 hours prior to going to bed.  Modified Medications   No medications on file  Discontinued Medications   No medications on file    Allergies: Allergies  Allergen Reactions   Other     Dust mites and roaches     Labs: Lab Results  Component Value Date    HIV1RNAQUANT 22 (H) 08/27/2022   HIV1RNAQUANT <20 (H) 07/17/2022   HIV1RNAQUANT Not Detected 06/20/2022   CD4TABS 865 08/27/2022   CD4TABS 562 03/19/2022   CD4TABS 768 03/05/2022    RPR and STI Lab Results  Component Value Date   LABRPR NON-REACTIVE 08/27/2022   LABRPR NON-REACTIVE 03/19/2022   LABRPR NON-REACTIVE 03/05/2022    STI Results GC CT  08/27/2022 10:44 AM Negative  Negative   03/05/2022  9:44 AM Negative  Negative     Hepatitis B Lab Results  Component Value Date   HEPBSAB REACTIVE (A) 03/05/2022   HEPBSAG NON-REACTIVE 03/05/2022   HEPBCAB NON-REACTIVE 03/05/2022   Hepatitis C Lab Results  Component Value Date   HEPCAB NON-REACTIVE 03/05/2022   Hepatitis A Lab Results  Component Value Date   HAV REACTIVE (A) 03/05/2022   Lipids: Lab Results  Component Value Date   CHOL 165 10/16/2022   TRIG (H) 10/16/2022    667.0 Triglyceride is over 400; calculations on Lipids are invalid.   HDL 33.10 (L) 10/16/2022   CHOLHDL 5 10/16/2022   LDLCALC  03/19/2022     Comment:     . LDL cholesterol not calculated. Triglyceride levels greater than 400 mg/dL invalidate calculated LDL results. . Reference range: <100 . Desirable range <100 mg/dL for primary prevention;   <70 mg/dL for patients with CHD or diabetic patients  with > or =  2 CHD risk factors. Marland Kitchen LDL-C is now calculated using the Martin-Hopkins  calculation, which is a validated novel method providing  better accuracy than the Friedewald equation in the  estimation of LDL-C.  Horald Pollen et al. Lenox Ahr. 3220;254(27): 2061-2068  (http://education.[employer].com/faq/FAQ164)     TARGET DATE: Thy 29th  Assessment: [name] presents today for his maintenance Cabenuva injections. Past injections were tolerated well without issues. Last HIV RNA was 22 in May. Will check again today.  Administered cabotegravir 600mg /69mL in left upper outer quadrant of the gluteal muscle. Administered rilpivirine 900  mg/5mL in the right upper outer quadrant of the gluteal muscle. No issues with injections. He will follow up in 2 months for next set of injections.  Plan: - Cabenuva injections administered - HIV RNA today - Next injections scheduled for 01/14/23 with me and 03/17/23 with Tammy Sours - Call with any issues or questions  Miela Desjardin L. Baylin Gamblin, PharmD, BCIDP, AAHIVP, CPP Clinical Pharmacist Practitioner Infectious Diseases Clinical Pharmacist [facility]

## 2022-11-29 ENCOUNTER — Encounter: Payer: Self-pay | Admitting: Nurse Practitioner

## 2022-12-01 ENCOUNTER — Other Ambulatory Visit: Payer: Self-pay | Admitting: Nurse Practitioner

## 2022-12-01 DIAGNOSIS — I10 Essential (primary) hypertension: Secondary | ICD-10-CM

## 2022-12-01 DIAGNOSIS — M5416 Radiculopathy, lumbar region: Secondary | ICD-10-CM

## 2022-12-05 ENCOUNTER — Other Ambulatory Visit: Payer: Self-pay | Admitting: Nurse Practitioner

## 2022-12-05 DIAGNOSIS — M5416 Radiculopathy, lumbar region: Secondary | ICD-10-CM

## 2022-12-05 NOTE — Telephone Encounter (Signed)
Medication was discontinued on 10/16/2022. Is it okay to refuse refill?

## 2022-12-17 ENCOUNTER — Ambulatory Visit: Payer: BC Managed Care – PPO | Admitting: Nurse Practitioner

## 2022-12-17 ENCOUNTER — Encounter: Payer: Self-pay | Admitting: Nurse Practitioner

## 2022-12-17 VITALS — BP 102/68 | HR 67 | Temp 97.8°F | Ht 69.0 in | Wt 180.8 lb

## 2022-12-17 DIAGNOSIS — M5416 Radiculopathy, lumbar region: Secondary | ICD-10-CM | POA: Diagnosis not present

## 2022-12-17 DIAGNOSIS — A630 Anogenital (venereal) warts: Secondary | ICD-10-CM | POA: Insufficient documentation

## 2022-12-17 MED ORDER — METHOCARBAMOL 750 MG PO TABS
750.0000 mg | ORAL_TABLET | Freq: Four times a day (QID) | ORAL | 5 refills | Status: DC | PRN
Start: 2022-12-17 — End: 2023-08-20

## 2022-12-17 NOTE — Progress Notes (Signed)
Bethanie Dicker, NP-C Phone: [phone]  [name] is a 57 y.o. male who presents today for referral and medication refill.   Lumbar Radiculopathy- Patient requesting refills on Robaxin. It does help with his symptoms. He is no longer doing physical therapy. He was last evaluated by [facility] on 08/07/2022 for his back.   Patient with history of anal warts. He had some removed while living in [place] approximately 10 years ago. He believes they have returned. He is requesting a referral to Gastroenterology for evaluation and removal. He does have itching and leakage. He denies problems with constipation. Denies bleeding or blood in his stool.   Social History   Tobacco Use  Smoking Status Every Day   Current packs/day: 1.50   Average packs/day: 1.5 packs/day for 30.0 years (45.0 ttl pk-yrs)   Types: Cigarettes  Smokeless Tobacco Never  Tobacco Comments   1.5 packs daily    Current Outpatient Medications on File Prior to Visit  Medication Sig Dispense Refill   albuterol (VENTOLIN HFA) 108 (90 Base) MCG/ACT inhaler TAKE 2 PUFFS BY MOUTH EVERY 6 HOURS AS NEEDED FOR WHEEZE OR SHORTNESS OF BREATH 18 each 2   amLODipine (NORVASC) 10 MG tablet Take 1 tablet (10 mg total) by mouth daily. 90 tablet 0   escitalopram (LEXAPRO) 20 MG tablet Take 1 tablet (20 mg total) by mouth daily. 90 tablet 1   famciclovir (FAMVIR) 500 MG tablet TAKE 1 TABLET BY MOUTH TWICE DAILY DURING A FLARE UP/OUTBREAK UNTIL RESOLVED. 60 tablet 3   fenofibrate (TRICOR) 145 MG tablet Take 1 tablet (145 mg total) by mouth daily. 90 tablet 3   fluticasone (FLONASE) 50 MCG/ACT nasal spray Place 1 spray into both nostrils daily.     lisinopril-hydrochlorothiazide (ZESTORETIC) 20-12.5 MG tablet Take 1 tablet by mouth daily. 90 tablet 3   pramipexole (MIRAPEX) 0.5 MG tablet Take 1 tablet (0.5 mg total) by mouth at bedtime. Take 2-3 hours prior to going to bed. 90 tablet 0   No current facility-administered medications on file  prior to visit.    ROS see history of present illness  Objective  Physical Exam Vitals:   12/17/22 0855  BP: 102/68  Pulse: 67  Temp: 97.8 F (36.6 C)  SpO2: 98%    BP Readings from Last 3 Encounters:  12/17/22 102/68  10/16/22 110/70  09/13/22 139/87   Wt Readings from Last 3 Encounters:  12/17/22 180 lb 12.8 oz (82 kg)  10/16/22 180 lb (81.6 kg)  09/13/22 181 lb (82.1 kg)    Physical Exam Constitutional:      General: He is not in acute distress.    Appearance: Normal appearance.  HENT:     Head: Normocephalic.  Cardiovascular:     Rate and Rhythm: Normal rate and regular rhythm.     Heart sounds: Normal heart sounds.  Pulmonary:     Effort: Pulmonary effort is normal.     Breath sounds: Normal breath sounds.  Skin:    General: Skin is warm and dry.  Neurological:     General: No focal deficit present.     Mental Status: He is alert.  Psychiatric:        Mood and Affect: Mood normal.        Behavior: Behavior normal.    Assessment/Plan: Please see individual problem list.  Anal warts Assessment & Plan: Will refer to Gastroenterology for further evaluation and management.   Orders: -     Ambulatory referral to Gastroenterology  Lumbar  radiculopathy Assessment & Plan: Chronic. Completed physical therapy, doing home exercises and stretches. Symptoms well managed with Robaxin 750 mg Q6 PRN. Continue. Refills sent. Encouraged to follow up with Ortho.   Orders: -     Methocarbamol; Take 1 tablet (750 mg total) by mouth every 6 (six) hours as needed for muscle spasms.  Dispense: 60 tablet; Refill: 5   Return in 4 months (on 04/17/2023) for Follow up as previously scheduled, sooner PRN.   Bethanie Dicker, NP-C [facility] - [employer]

## 2022-12-18 ENCOUNTER — Telehealth: Payer: Self-pay | Admitting: Gastroenterology

## 2022-12-18 NOTE — Telephone Encounter (Signed)
Patient is requesting for an sooner appointment base on his referral.

## 2022-12-19 ENCOUNTER — Other Ambulatory Visit: Payer: Self-pay

## 2022-12-19 ENCOUNTER — Telehealth: Payer: Self-pay

## 2022-12-19 DIAGNOSIS — F17210 Nicotine dependence, cigarettes, uncomplicated: Secondary | ICD-10-CM

## 2022-12-19 DIAGNOSIS — Z122 Encounter for screening for malignant neoplasm of respiratory organs: Secondary | ICD-10-CM

## 2022-12-19 DIAGNOSIS — Z87891 Personal history of nicotine dependence: Secondary | ICD-10-CM

## 2022-12-19 NOTE — Telephone Encounter (Signed)
Pt aware that appt is located in [place]. Pt understands if paperwork received has [place] listed as location pt knows to come to [facility].

## 2022-12-19 NOTE — Telephone Encounter (Signed)
Ask pt to disregard paperwork that had [place] direction listed. Pt is aware appt is located in [place].

## 2022-12-24 ENCOUNTER — Encounter: Payer: Self-pay | Admitting: Nurse Practitioner

## 2022-12-24 NOTE — Assessment & Plan Note (Signed)
Chronic. Completed physical therapy, doing home exercises and stretches. Symptoms well managed with Robaxin 750 mg Q6 PRN. Continue. Refills sent. Encouraged to follow up with Ortho.

## 2022-12-24 NOTE — Assessment & Plan Note (Signed)
Will refer to Gastroenterology for further evaluation and management.

## 2022-12-27 ENCOUNTER — Ambulatory Visit: Payer: BC Managed Care – PPO

## 2022-12-31 ENCOUNTER — Encounter: Payer: Self-pay | Admitting: Nurse Practitioner

## 2022-12-31 DIAGNOSIS — F411 Generalized anxiety disorder: Secondary | ICD-10-CM

## 2022-12-31 MED ORDER — ESCITALOPRAM OXALATE 20 MG PO TABS
20.0000 mg | ORAL_TABLET | Freq: Every day | ORAL | 3 refills | Status: DC
Start: 2022-12-31 — End: 2023-10-16

## 2023-01-03 NOTE — Progress Notes (Signed)
The 10-year ASCVD risk score (Arnett DK, et al., 2019) is: 9.4%   Values used to calculate the score:     Age: 57 years     Sex: Male     Is Non-Hispanic African American: No     Diabetic: No     Tobacco smoker: Yes     Systolic Blood Pressure: 102 mmHg     Is BP treated: Yes     HDL Cholesterol: 33.1 mg/dL     Total Cholesterol: 165 mg/dL  Sandie Ano, RN

## 2023-01-14 ENCOUNTER — Ambulatory Visit (INDEPENDENT_AMBULATORY_CARE_PROVIDER_SITE_OTHER): Payer: BC Managed Care – PPO | Admitting: Pharmacist

## 2023-01-14 ENCOUNTER — Other Ambulatory Visit: Payer: Self-pay

## 2023-01-14 DIAGNOSIS — B20 Human immunodeficiency virus [HIV] disease: Secondary | ICD-10-CM | POA: Diagnosis not present

## 2023-01-14 DIAGNOSIS — Z21 Asymptomatic human immunodeficiency virus [HIV] infection status: Secondary | ICD-10-CM

## 2023-01-14 MED ORDER — CABOTEGRAVIR & RILPIVIRINE ER 600 & 900 MG/3ML IM SUER
1.0000 | Freq: Once | INTRAMUSCULAR | Status: AC
Start: 2023-01-14 — End: 2023-01-14
  Administered 2023-01-14: 1 via INTRAMUSCULAR

## 2023-01-14 NOTE — Progress Notes (Signed)
HPI: [name] is a 57 y.o. male who presents to the [facility] pharmacy clinic for [place] administration.  Patient Active Problem List   Diagnosis Date Noted   Anal warts 12/17/2022   Hypertriglyceridemia 07/16/2022   Lumbar radiculopathy 07/16/2022   Chronic obstructive pulmonary disease (HCC) 07/16/2022   Healthcare maintenance 03/19/2022   Obstructive sleep apnea 09/30/2021   Restless leg syndrome 09/30/2021   Elevated ferritin level 09/30/2021   Hypokalemia 05/19/2019   Polyp of transverse colon    Trochanteric bursitis of left hip 03/11/2019   Osteoarthritis of knee 03/11/2019   Cigarette nicotine dependence without complication 11/04/2018   Chronic insomnia 11/04/2018   Essential hypertension 03/24/2018   HIV (human immunodeficiency virus infection) (HCC) 03/03/2018    Patient's Medications  New Prescriptions   No medications on file  Previous Medications   ALBUTEROL (VENTOLIN HFA) 108 (90 BASE) MCG/ACT INHALER    TAKE 2 PUFFS BY MOUTH EVERY 6 HOURS AS NEEDED FOR WHEEZE OR SHORTNESS OF BREATH   AMLODIPINE (NORVASC) 10 MG TABLET    Take 1 tablet (10 mg total) by mouth daily.   ESCITALOPRAM (LEXAPRO) 20 MG TABLET    Take 1 tablet (20 mg total) by mouth daily.   FAMCICLOVIR (FAMVIR) 500 MG TABLET    TAKE 1 TABLET BY MOUTH TWICE DAILY DURING A FLARE UP/OUTBREAK UNTIL RESOLVED.   FENOFIBRATE (TRICOR) 145 MG TABLET    Take 1 tablet (145 mg total) by mouth daily.   FLUTICASONE (FLONASE) 50 MCG/ACT NASAL SPRAY    Place 1 spray into both nostrils daily.   LISINOPRIL-HYDROCHLOROTHIAZIDE (ZESTORETIC) 20-12.5 MG TABLET    Take 1 tablet by mouth daily.   METHOCARBAMOL (ROBAXIN) 750 MG TABLET    Take 1 tablet (750 mg total) by mouth every 6 (six) hours as needed for muscle spasms.   PRAMIPEXOLE (MIRAPEX) 0.5 MG TABLET    Take 1 tablet (0.5 mg total) by mouth at bedtime. Take 2-3 hours prior to going to bed.  Modified Medications   No medications on file  Discontinued Medications   No  medications on file    Allergies: Allergies  Allergen Reactions   Other     Dust mites and roaches     Labs: Lab Results  Component Value Date   HIV1RNAQUANT Not Detected 11/14/2022   HIV1RNAQUANT 22 (H) 08/27/2022   HIV1RNAQUANT <20 (H) 07/17/2022   CD4TABS 865 08/27/2022   CD4TABS 562 03/19/2022   CD4TABS 768 03/05/2022    RPR and STI Lab Results  Component Value Date   LABRPR NON-REACTIVE 08/27/2022   LABRPR NON-REACTIVE 03/19/2022   LABRPR NON-REACTIVE 03/05/2022    STI Results GC CT  08/27/2022 10:44 AM Negative  Negative   03/05/2022  9:44 AM Negative  Negative     Hepatitis B Lab Results  Component Value Date   HEPBSAB REACTIVE (A) 03/05/2022   HEPBSAG NON-REACTIVE 03/05/2022   HEPBCAB NON-REACTIVE 03/05/2022   Hepatitis C Lab Results  Component Value Date   HEPCAB NON-REACTIVE 03/05/2022   Hepatitis A Lab Results  Component Value Date   HAV REACTIVE (A) 03/05/2022   Lipids: Lab Results  Component Value Date   CHOL 165 10/16/2022   TRIG (H) 10/16/2022    667.0 Triglyceride is over 400; calculations on Lipids are invalid.   HDL 33.10 (L) 10/16/2022   CHOLHDL 5 10/16/2022   LDLCALC  03/19/2022     Comment:     . LDL cholesterol not calculated. Triglyceride levels greater than 400 mg/dL  invalidate calculated LDL results. . Reference range: <100 . Desirable range <100 mg/dL for primary prevention;   <70 mg/dL for patients with CHD or diabetic patients  with > or = 2 CHD risk factors. Marland Kitchen LDL-C is now calculated using the Martin-Hopkins  calculation, which is a validated novel method providing  better accuracy than the Friedewald equation in the  estimation of LDL-C.  Horald Pollen et al. Lenox Ahr. 1610;960(45): 2061-2068  (http://education.[employer].com/faq/FAQ164)     TARGET DATE: The 29th  Assessment: Dainel presents today for his maintenance Cabenuva injections. Past injections were tolerated well without issues. Last HIV RNA was  undetectable in July. Will defer labs. Declines annual flu and COVID vaccines today.   Administered cabotegravir 600mg /68mL in left upper outer quadrant of the gluteal muscle. Administered rilpivirine 900 mg/12mL in the right upper outer quadrant of the gluteal muscle. No issues with injections. He will follow up in 2 months for next set of injections.  Plan: - Cabenuva injections administered - Next injections scheduled for 03/17/23 with Tammy Sours and 05/18/22 with me - Call with any issues or questions  Yerik Zeringue L. Donnelle Olmeda, PharmD, BCIDP, AAHIVP, CPP Clinical Pharmacist Practitioner Infectious Diseases Clinical Pharmacist [facility]

## 2023-01-29 ENCOUNTER — Ambulatory Visit: Payer: BC Managed Care – PPO | Admitting: Gastroenterology

## 2023-01-29 ENCOUNTER — Encounter: Payer: Self-pay | Admitting: Gastroenterology

## 2023-01-29 VITALS — BP 151/80 | HR 66 | Temp 98.7°F | Ht 67.0 in | Wt 188.0 lb

## 2023-01-29 DIAGNOSIS — A630 Anogenital (venereal) warts: Secondary | ICD-10-CM | POA: Diagnosis not present

## 2023-01-29 NOTE — Progress Notes (Signed)
Gastroenterology Consultation  Referring Provider:     Bethanie Dicker, NP Primary Care Physician:  Bethanie Dicker, NP Primary Gastroenterologist:  Dr. Servando Snare     Reason for Consultation:     Anal warts        HPI:   [name] is a 57 y.o. y/o male referred for consultation & management of anal warts by Dr. Bethanie Dicker, NP.  This patient comes in today after having a colonoscopy by me back in 2020 with a repeat port stating that he needs a repeat colonoscopy in 5 years from then.  The patient now comes in with lumps that he feels around his rectum.  The patient has a history of anal warts in the past and was wondering if he is having anal warts again.  He reports that since his diagnosis of HIV he always has loose stools and finds it hard to keep the area clean and dry.  There is no report of any unexplained weight loss fevers chills nausea vomiting black stools or bloody stools.  Past Medical History:  Diagnosis Date   Allergy    Colon polyps    COPD (chronic obstructive pulmonary disease) (HCC)    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   Genital warts    HIV (human immunodeficiency virus infection) (HCC)    Hypertension    Sleep apnea     Past Surgical History:  Procedure Laterality Date   COLONOSCOPY WITH PROPOFOL N/A 03/22/2019   Procedure: COLONOSCOPY WITH BIOPSIES;  Surgeon: Midge Minium, MD;  [facility];  Service: Endoscopy;  Laterality: N/A;   I & D EXTREMITY Left 05/21/2019   Procedure: IRRIGATION AND DEBRIDEMENT LEFT LEG;  Surgeon: Carolan Shiver, MD;  Location: [facility];  Service: General;  Laterality: Left;   I & D EXTREMITY Left 05/25/2019   Procedure: IRRIGATION AND DEBRIDEMENT Left Leg;  Surgeon: Carolan Shiver, MD;  Location: [facility];  Service: General;  Laterality: Left;   POLYPECTOMY N/A 03/22/2019   Procedure: POLYPECTOMY;  Surgeon: Midge Minium, MD;  Location: [facility];  Service: Endoscopy;  Laterality: N/A;    Prior  to Admission medications   Medication Sig Start Date End Date Taking? Authorizing Provider  albuterol (VENTOLIN HFA) 108 (90 Base) MCG/ACT inhaler TAKE 2 PUFFS BY MOUTH EVERY 6 HOURS AS NEEDED FOR WHEEZE OR SHORTNESS OF BREATH 09/13/22  Yes Veryl Speak, FNP  amLODipine (NORVASC) 10 MG tablet Take 1 tablet (10 mg total) by mouth daily. 07/03/22  Yes Abernathy, Alyssa, NP  escitalopram (LEXAPRO) 20 MG tablet Take 1 tablet (20 mg total) by mouth daily. 12/31/22  Yes Bethanie Dicker, NP  famciclovir (FAMVIR) 500 MG tablet TAKE 1 TABLET BY MOUTH TWICE DAILY DURING A FLARE UP/OUTBREAK UNTIL RESOLVED. 12/02/21  Yes Abernathy, Alyssa, NP  fenofibrate (TRICOR) 145 MG tablet Take 1 tablet (145 mg total) by mouth daily. 10/23/22  Yes Bethanie Dicker, NP  fluticasone (FLONASE) 50 MCG/ACT nasal spray Place 1 spray into both nostrils daily.   Yes [provider]  lisinopril-hydrochlorothiazide (ZESTORETIC) 20-12.5 MG tablet Take 1 tablet by mouth daily. 07/16/22  Yes Bethanie Dicker, NP  methocarbamol (ROBAXIN) 750 MG tablet Take 1 tablet (750 mg total) by mouth every 6 (six) hours as needed for muscle spasms. 12/17/22  Yes Bethanie Dicker, NP  pramipexole (MIRAPEX) 0.5 MG tablet Take 1 tablet (0.5 mg total) by mouth at bedtime. Take 2-3 hours prior to going to bed. 04/16/22  Yes  Sallyanne Kuster, NP    Family History  Adopted: Yes     Social History   Tobacco Use   Smoking status: Every Day    Current packs/day: 1.50    Average packs/day: 1.5 packs/day for 30.0 years (45.0 ttl pk-yrs)    Types: Cigarettes   Smokeless tobacco: Never   Tobacco comments:    1.5 packs daily  Vaping Use   Vaping status: Never Used  Substance Use Topics   Alcohol use: Yes    Alcohol/week: 21.0 standard drinks of alcohol    Types: 21 Shots of liquor per week    Comment: weekends   Drug use: Never    Allergies as of 01/29/2023 - Review Complete 01/29/2023  Allergen Reaction Noted   Other  03/24/2018    Review of  Systems:    All systems reviewed and negative except where noted in HPI.   Physical Exam:  There were no vitals taken for this visit. No LMP for male patient. General:   Alert,  Well-developed, well-nourished, pleasant and cooperative in NAD Head:  Normocephalic and atraumatic. Eyes:  Sclera clear, no icterus.   Conjunctiva pink. Ears:  Normal auditory acuity. Rectal: External exam without any obvious abnormalities except the area he is reporting appears to be a scar from his previous anal wart treatment.  Pulses:  Normal pulses noted. Extremities:  No clubbing or edema.  No cyanosis. Neurologic:  Alert and oriented x3;  grossly normal neurologically. Skin:  Intact without significant lesions or rashes.  No jaundice. Lymph Nodes:  No significant cervical adenopathy. Psych:  Alert and cooperative. Normal mood and affect.  Imaging Studies: No results found.  Assessment and Plan:   [name] is a 57 y.o. y/o male who comes in today with a concern about anal warts.  The patient's rectal exam did not show anything consistent with anal warts and he has been told to take Imodium to help his stools firm up there by decreasing the amount of problems he is having from his anal irritation.  The patient will need a repeat colonoscopy in 1 years time.  The patient has been explained the plan and agrees with it.    Midge Minium, MD. Clementeen Graham    Note: This dictation was prepared with Dragon dictation along with smaller phrase technology. Any transcriptional errors that result from this process are unintentional.

## 2023-01-31 ENCOUNTER — Ambulatory Visit
Admission: RE | Admit: 2023-01-31 | Discharge: 2023-01-31 | Disposition: A | Discharge status: Home / Self Care | Payer: BC Managed Care – PPO | Source: Ambulatory Visit | Attending: Acute Care | Admitting: Acute Care

## 2023-01-31 DIAGNOSIS — Z122 Encounter for screening for malignant neoplasm of respiratory organs: Secondary | ICD-10-CM | POA: Diagnosis not present

## 2023-01-31 DIAGNOSIS — Z87891 Personal history of nicotine dependence: Secondary | ICD-10-CM | POA: Insufficient documentation

## 2023-01-31 DIAGNOSIS — F17210 Nicotine dependence, cigarettes, uncomplicated: Secondary | ICD-10-CM | POA: Insufficient documentation

## 2023-02-10 ENCOUNTER — Other Ambulatory Visit: Payer: Self-pay | Admitting: Acute Care

## 2023-02-10 DIAGNOSIS — Z87891 Personal history of nicotine dependence: Secondary | ICD-10-CM

## 2023-02-10 DIAGNOSIS — Z122 Encounter for screening for malignant neoplasm of respiratory organs: Secondary | ICD-10-CM

## 2023-03-17 ENCOUNTER — Encounter: Payer: Self-pay | Admitting: Family

## 2023-03-17 ENCOUNTER — Other Ambulatory Visit (HOSPITAL_COMMUNITY)
Admission: RE | Admit: 2023-03-17 | Discharge: 2023-03-17 | Disposition: A | Discharge status: Home / Self Care | Payer: BC Managed Care – PPO | Source: Ambulatory Visit | Attending: Family | Admitting: Family

## 2023-03-17 ENCOUNTER — Other Ambulatory Visit: Payer: Self-pay

## 2023-03-17 ENCOUNTER — Ambulatory Visit: Payer: BC Managed Care – PPO | Admitting: Family

## 2023-03-17 VITALS — BP 119/77 | HR 67 | Temp 96.4°F | Ht 69.5 in | Wt 189.0 lb

## 2023-03-17 DIAGNOSIS — Z113 Encounter for screening for infections with a predominantly sexual mode of transmission: Secondary | ICD-10-CM

## 2023-03-17 DIAGNOSIS — F17210 Nicotine dependence, cigarettes, uncomplicated: Secondary | ICD-10-CM

## 2023-03-17 DIAGNOSIS — Z1212 Encounter for screening for malignant neoplasm of rectum: Secondary | ICD-10-CM | POA: Insufficient documentation

## 2023-03-17 DIAGNOSIS — Z23 Encounter for immunization: Secondary | ICD-10-CM

## 2023-03-17 DIAGNOSIS — Z21 Asymptomatic human immunodeficiency virus [HIV] infection status: Secondary | ICD-10-CM | POA: Diagnosis not present

## 2023-03-17 DIAGNOSIS — B20 Human immunodeficiency virus [HIV] disease: Secondary | ICD-10-CM | POA: Diagnosis not present

## 2023-03-17 DIAGNOSIS — Z0000 Encounter for general adult medical examination without abnormal findings: Secondary | ICD-10-CM

## 2023-03-17 MED ORDER — CABOTEGRAVIR & RILPIVIRINE ER 600 & 900 MG/3ML IM SUER
1.0000 | Freq: Once | INTRAMUSCULAR | Status: AC
Start: 2023-03-17 — End: 2023-03-17
  Administered 2023-03-17: 1 via INTRAMUSCULAR

## 2023-03-17 NOTE — Assessment & Plan Note (Signed)
[name] continues to have well-controlled virus with good adherence and tolerance to [place].  Reviewed previous lab work and discussed plan of care and U equals U.  Check blood work.  Continue current dose of Cabenuva.  Plan for follow-up in 6 months or sooner if needed with pharmacy provider visits in between every 2 months.

## 2023-03-17 NOTE — Patient Instructions (Addendum)
Nice to see you.  We will check your lab work today.  Continue to take your medication daily as prescribed.  Refills have been sent to the pharmacy.  Plan for follow up in 6 months or sooner if needed with lab work on the same day and follow up with Pharmacy Provider in between every 2 months.   Have a great day and stay safe!

## 2023-03-17 NOTE — Progress Notes (Signed)
Brief Narrative   Patient ID: [name], male    DOB: Jan 15, 1966, 57 y.o.   MRN: [id number]  Mr. Farquhar is a 57 y/o caucasian male diagnosed with HIV-1 disease in 2007 with CD4 count <100 and viral load >1 million. No genosure available. Entered care at [facility] Stage 3. [id number] negative. ART experienced with Abavavir/lamivudine and raltegravir, Triumeq and Biktarvy   Subjective:    Chief Complaint  Patient presents with   Follow-up    B20-Cabenuva    HPI:  [name] is a 57 y.o. male with HIV disease last seen by [name] on 01/14/2023 with good adherence and tolerance to [place].  Last lab work completed on 11/14/2022 with viral load that is undetectable and CD4 count 865.  Kidney function, liver function, electrolytes within normal ranges.  Here today for routine follow-up.  [name] has been doing well since his last office visit and has noted decreased muscle soreness with certain injection techniques.  No new concerns/complaints.  Condoms and STD testing offered.  Healthcare maintenance reviewed.  Routine dental care and colonoscopy up-to-date per recommendations.  Denies feelings of being down, depressed, or hopeless.  Denies fevers, chills, night sweats, headaches, changes in vision, neck pain/stiffness, nausea, diarrhea, vomiting, lesions or rashes.  Lab Results  Component Value Date   CD4TCELL 34 08/27/2022   CD4TABS 865 08/27/2022   Lab Results  Component Value Date   HIV1RNAQUANT Not Detected 11/14/2022     Allergies  Allergen Reactions   Other     Dust mites and roaches       Outpatient Medications Prior to Visit  Medication Sig Dispense Refill   albuterol (VENTOLIN HFA) 108 (90 Base) MCG/ACT inhaler TAKE 2 PUFFS BY MOUTH EVERY 6 HOURS AS NEEDED FOR WHEEZE OR SHORTNESS OF BREATH 18 each 2   amLODipine (NORVASC) 10 MG tablet Take 1 tablet (10 mg total) by mouth daily. 90 tablet 0   escitalopram (LEXAPRO) 20 MG tablet Take 1 tablet (20 mg total) by  mouth daily. 90 tablet 3   famciclovir (FAMVIR) 500 MG tablet TAKE 1 TABLET BY MOUTH TWICE DAILY DURING A FLARE UP/OUTBREAK UNTIL RESOLVED. 60 tablet 3   fenofibrate (TRICOR) 145 MG tablet Take 1 tablet (145 mg total) by mouth daily. 90 tablet 3   fluticasone (FLONASE) 50 MCG/ACT nasal spray Place 1 spray into both nostrils daily.     lisinopril-hydrochlorothiazide (ZESTORETIC) 20-12.5 MG tablet Take 1 tablet by mouth daily. 90 tablet 3   methocarbamol (ROBAXIN) 750 MG tablet Take 1 tablet (750 mg total) by mouth every 6 (six) hours as needed for muscle spasms. 60 tablet 5   pramipexole (MIRAPEX) 0.5 MG tablet Take 1 tablet (0.5 mg total) by mouth at bedtime. Take 2-3 hours prior to going to bed. 90 tablet 0   No facility-administered medications prior to visit.     Past Medical History:  Diagnosis Date   Allergy    Colon polyps    COPD (chronic obstructive pulmonary disease) (HCC)    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   Genital warts    HIV (human immunodeficiency virus infection) (HCC)    Hypertension    Sleep apnea      Past Surgical History:  Procedure Laterality Date   COLONOSCOPY WITH PROPOFOL N/A 03/22/2019   Procedure: COLONOSCOPY WITH BIOPSIES;  Surgeon: Midge Minium, MD;  Location: [facility];  Service: Endoscopy;  Laterality: N/A;   I & D EXTREMITY Left  05/21/2019   Procedure: IRRIGATION AND DEBRIDEMENT LEFT LEG;  Surgeon: Carolan Shiver, MD;  Location: [facility];  Service: General;  Laterality: Left;   I & D EXTREMITY Left 05/25/2019   Procedure: IRRIGATION AND DEBRIDEMENT Left Leg;  Surgeon: Carolan Shiver, MD;  Location: [facility];  Service: General;  Laterality: Left;   POLYPECTOMY N/A 03/22/2019   Procedure: POLYPECTOMY;  Surgeon: Midge Minium, MD;  Location: [facility];  Service: Endoscopy;  Laterality: N/A;      Review of Systems  Constitutional:  Negative for appetite change, chills, fatigue, fever and unexpected weight  change.  Eyes:  Negative for visual disturbance.  Respiratory:  Negative for cough, chest tightness, shortness of breath and wheezing.   Cardiovascular:  Negative for chest pain and leg swelling.  Gastrointestinal:  Negative for abdominal pain, constipation, diarrhea, nausea and vomiting.  Genitourinary:  Negative for dysuria, flank pain, frequency, genital sores, hematuria and urgency.  Skin:  Negative for rash.  Allergic/Immunologic: Negative for immunocompromised state.  Neurological:  Negative for dizziness and headaches.      Objective:    BP 119/77   Pulse 67   Temp (!) 96.4 F (35.8 C) (Temporal)   Ht 5' 9.5" (1.765 m)   Wt 189 lb (85.7 kg)   BMI 27.51 kg/m  Nursing note and vital signs reviewed.  Physical Exam Constitutional:      General: He is not in acute distress.    Appearance: He is well-developed.  Eyes:     Conjunctiva/sclera: Conjunctivae normal.  Cardiovascular:     Rate and Rhythm: Normal rate and regular rhythm.     Heart sounds: Normal heart sounds. No murmur heard.    No friction rub. No gallop.  Pulmonary:     Effort: Pulmonary effort is normal. No respiratory distress.     Breath sounds: Normal breath sounds. No wheezing or rales.  Chest:     Chest wall: No tenderness.  Abdominal:     General: Bowel sounds are normal.     Palpations: Abdomen is soft.     Tenderness: There is no abdominal tenderness.  Musculoskeletal:     Cervical back: Neck supple.  Lymphadenopathy:     Cervical: No cervical adenopathy.  Skin:    General: Skin is warm and dry.     Findings: No rash.  Neurological:     Mental Status: He is alert and oriented to person, place, and time.  Psychiatric:        Behavior: Behavior normal.        Thought Content: Thought content normal.        Judgment: Judgment normal.         03/17/2023   10:25 AM 07/16/2022    9:07 AM 04/16/2022    9:40 AM 03/19/2022    8:58 AM 01/15/2022   10:54 AM  Depression screen PHQ 2/9   Decreased Interest 0 1 0 0 0  Down, Depressed, Hopeless 0 0 0 0 0  PHQ - 2 Score 0 1 0 0 0  Altered sleeping  2     Tired, decreased energy  1     Change in appetite  0     Feeling bad or failure about yourself   0     Trouble concentrating  1     Moving slowly or fidgety/restless  0     Suicidal thoughts  0     PHQ-9 Score  5     Difficult doing work/chores  Not  difficult at all          Assessment & Plan:    Patient Active Problem List   Diagnosis Date Noted   Anal warts 12/17/2022   Hypertriglyceridemia 07/16/2022   Lumbar radiculopathy 07/16/2022   Chronic obstructive pulmonary disease (HCC) 07/16/2022   Healthcare maintenance 03/19/2022   Obstructive sleep apnea 09/30/2021   Restless leg syndrome 09/30/2021   Elevated ferritin level 09/30/2021   Hypokalemia 05/19/2019   Polyp of transverse colon    Trochanteric bursitis of left hip 03/11/2019   Osteoarthritis of knee 03/11/2019   Cigarette nicotine dependence without complication 11/04/2018   Chronic insomnia 11/04/2018   Essential hypertension 03/24/2018   HIV (human immunodeficiency virus infection) (HCC) 03/03/2018     Problem List Items Addressed This Visit       Other   HIV (human immunodeficiency virus infection) (HCC) - Primary    Mr. Galli continues to have well-controlled virus with good adherence and tolerance to [place].  Reviewed previous lab work and discussed plan of care and U equals U.  Check blood work.  Continue current dose of Cabenuva.  Plan for follow-up in 6 months or sooner if needed with pharmacy provider visits in between every 2 months.      Relevant Orders   COMPLETE METABOLIC PANEL WITH GFR   HIV-1 RNA quant-no reflex-bld   T-helper cell (CD4)- ([facility] clinic only)   Cigarette nicotine dependence without complication    Mr. Breig continues to smoke tobacco daily.  Discussed importance of tobacco cessation to reduce risk of development of disease and/or complications in the future.   Not ready to quit at this time.      Healthcare maintenance    Discussed importance of safe sexual practice and condom use. Condoms and STD testing offered.  Prevnar 20 updated.  Routine dental care and colonoscopy up to date Anal pap smear collected.       Other Visit Diagnoses     Need for pneumococcal 20-valent conjugate vaccination       Relevant Orders   Pneumococcal conjugate vaccine 20-valent (Prevnar-20) (Completed)   Screening for STDs (sexually transmitted diseases)       Relevant Orders   RPR   Screening for rectal cancer       Relevant Orders   Cytology - PAP        I am having Weldon Inches maintain his fluticasone, famciclovir, pramipexole, amLODipine, lisinopril-hydrochlorothiazide, albuterol, fenofibrate, methocarbamol, and escitalopram. We administered cabotegravir & rilpivirine ER.   Meds ordered this encounter  Medications   cabotegravir & rilpivirine ER (CABENUVA) 600 & 900 MG/3ML injection 1 kit     Follow-up: Return in about 6 months (around 09/14/2023). or sooner if needed.    Marcos Eke, MSN, FNP-C Nurse Practitioner [facility] for [facility]ious Disease [facility] Main number: [phone]

## 2023-03-17 NOTE — Assessment & Plan Note (Signed)
[name] continues to smoke tobacco daily.  Discussed importance of tobacco cessation to reduce risk of development of disease and/or complications in the future.  Not ready to quit at this time.

## 2023-03-17 NOTE — Assessment & Plan Note (Signed)
Discussed importance of safe sexual practice and condom use. Condoms and STD testing offered.  Prevnar 20 updated.  Routine dental care and colonoscopy up to date Anal pap smear collected.

## 2023-03-18 LAB — T-HELPER CELL (CD4) - (RCID CLINIC ONLY)
CD4 % Helper T Cell: 43 % (ref 33–65)
CD4 T Cell Abs: 891 /uL (ref 400–1790)

## 2023-03-19 LAB — COMPLETE METABOLIC PANEL WITH GFR
AG Ratio: 1.8 (calc) (ref 1.0–2.5)
ALT: 16 U/L (ref 9–46)
AST: 24 U/L (ref 10–35)
Albumin: 4.6 g/dL (ref 3.6–5.1)
Alkaline phosphatase (APISO): 64 U/L (ref 35–144)
BUN: 15 mg/dL (ref 7–25)
CO2: 24 mmol/L (ref 20–32)
Calcium: 9.4 mg/dL (ref 8.6–10.3)
Chloride: 100 mmol/L (ref 98–110)
Creat: 1.06 mg/dL (ref 0.70–1.30)
Globulin: 2.6 g/dL (ref 1.9–3.7)
Glucose, Bld: 95 mg/dL (ref 65–99)
Potassium: 4.3 mmol/L (ref 3.5–5.3)
Sodium: 135 mmol/L (ref 135–146)
Total Bilirubin: 0.4 mg/dL (ref 0.2–1.2)
Total Protein: 7.2 g/dL (ref 6.1–8.1)
eGFR: 83 mL/min/{1.73_m2} (ref >=60)

## 2023-03-19 LAB — HIV-1 RNA QUANT-NO REFLEX-BLD
HIV 1 RNA Quant: <20 {copies}/mL — ABNORMAL HIGH
HIV-1 RNA Quant, Log: <1.3 {Log} — ABNORMAL HIGH

## 2023-03-19 LAB — RPR: RPR Ser Ql: NONREACTIVE

## 2023-03-26 LAB — CYTOLOGY - PAP: Diagnosis: NEGATIVE

## 2023-04-05 ENCOUNTER — Other Ambulatory Visit: Payer: Self-pay | Admitting: Nurse Practitioner

## 2023-04-05 DIAGNOSIS — I10 Essential (primary) hypertension: Secondary | ICD-10-CM

## 2023-04-10 DIAGNOSIS — I10 Essential (primary) hypertension: Secondary | ICD-10-CM | POA: Diagnosis not present

## 2023-04-10 DIAGNOSIS — Z720 Tobacco use: Secondary | ICD-10-CM | POA: Diagnosis not present

## 2023-04-10 DIAGNOSIS — J449 Chronic obstructive pulmonary disease, unspecified: Secondary | ICD-10-CM | POA: Diagnosis not present

## 2023-04-10 DIAGNOSIS — E781 Pure hyperglyceridemia: Secondary | ICD-10-CM | POA: Diagnosis not present

## 2023-04-17 ENCOUNTER — Ambulatory Visit: Payer: BC Managed Care – PPO | Admitting: Nurse Practitioner

## 2023-04-17 ENCOUNTER — Encounter: Payer: Self-pay | Admitting: Nurse Practitioner

## 2023-04-17 VITALS — BP 132/86 | HR 97 | Temp 98.2°F | Ht 69.5 in | Wt 185.2 lb

## 2023-04-17 DIAGNOSIS — I10 Essential (primary) hypertension: Secondary | ICD-10-CM | POA: Diagnosis not present

## 2023-04-17 DIAGNOSIS — Z1329 Encounter for screening for other suspected endocrine disorder: Secondary | ICD-10-CM

## 2023-04-17 DIAGNOSIS — Z21 Asymptomatic human immunodeficiency virus [HIV] infection status: Secondary | ICD-10-CM

## 2023-04-17 DIAGNOSIS — E781 Pure hyperglyceridemia: Secondary | ICD-10-CM

## 2023-04-17 DIAGNOSIS — J432 Centrilobular emphysema: Secondary | ICD-10-CM | POA: Diagnosis not present

## 2023-04-17 DIAGNOSIS — F419 Anxiety disorder, unspecified: Secondary | ICD-10-CM | POA: Insufficient documentation

## 2023-04-17 DIAGNOSIS — F32A Depression, unspecified: Secondary | ICD-10-CM

## 2023-04-17 LAB — LIPID PANEL
Cholesterol: 124 mg/dL (ref 0–200)
HDL: 32.2 mg/dL — ABNORMAL LOW (ref >39.00)
Total CHOL/HDL Ratio: 4
Triglycerides: 454 mg/dL — ABNORMAL HIGH (ref 0.0–149.0)

## 2023-04-17 LAB — COMPREHENSIVE METABOLIC PANEL
ALT: 33 U/L (ref 0–53)
AST: 44 U/L — ABNORMAL HIGH (ref 0–37)
Albumin: 4.2 g/dL (ref 3.5–5.2)
Alkaline Phosphatase: 56 U/L (ref 39–117)
BUN: 17 mg/dL (ref 6–23)
CO2: 27 meq/L (ref 19–32)
Calcium: 8.9 mg/dL (ref 8.4–10.5)
Chloride: 97 meq/L (ref 96–112)
Creatinine, Ser: 1.23 mg/dL (ref 0.40–1.50)
GFR: 66.15 mL/min (ref >60.00)
Glucose, Bld: 94 mg/dL (ref 70–99)
Potassium: 4.3 meq/L (ref 3.5–5.1)
Sodium: 132 meq/L — ABNORMAL LOW (ref 135–145)
Total Bilirubin: 0.5 mg/dL (ref 0.2–1.2)
Total Protein: 7 g/dL (ref 6.0–8.3)

## 2023-04-17 LAB — CBC WITH DIFFERENTIAL/PLATELET
Basophils Absolute: 0.1 10*3/uL (ref 0.0–0.1)
Basophils Relative: 1.1 % (ref 0.0–3.0)
Eosinophils Absolute: 0.2 10*3/uL (ref 0.0–0.7)
Eosinophils Relative: 2.7 % (ref 0.0–5.0)
HCT: 47.3 % (ref 39.0–52.0)
Hemoglobin: 16.1 g/dL (ref 13.0–17.0)
Lymphocytes Relative: 30.9 % (ref 12.0–46.0)
Lymphs Abs: 2.4 10*3/uL (ref 0.7–4.0)
MCHC: 34.1 g/dL (ref 30.0–36.0)
MCV: 93.4 fL (ref 78.0–100.0)
Monocytes Absolute: 1 10*3/uL (ref 0.1–1.0)
Monocytes Relative: 13.1 % — ABNORMAL HIGH (ref 3.0–12.0)
Neutro Abs: 4 10*3/uL (ref 1.4–7.7)
Neutrophils Relative %: 52.2 % (ref 43.0–77.0)
Platelets: 303 10*3/uL (ref 150.0–400.0)
RBC: 5.06 Mil/uL (ref 4.22–5.81)
RDW: 14.2 % (ref 11.5–15.5)
WBC: 7.7 10*3/uL (ref 4.0–10.5)

## 2023-04-17 LAB — TSH: TSH: 2.18 u[IU]/mL (ref 0.35–5.50)

## 2023-04-17 LAB — LDL CHOLESTEROL, DIRECT: Direct LDL: 37 mg/dL

## 2023-04-17 MED ORDER — UMECLIDINIUM-VILANTEROL 62.5-25 MCG/ACT IN AEPB: 1.0000 | INHALATION_SPRAY | Freq: Every day | RESPIRATORY_TRACT | 11 refills | Status: DC

## 2023-04-17 NOTE — Assessment & Plan Note (Signed)
They are on fenofibrate for hypertriglyceridemia. We will continue fenofibrate 145mg  daily and order a lipid panel to monitor.

## 2023-04-17 NOTE — Assessment & Plan Note (Signed)
Managed by ID. Continue current medication regimen. Follow up as scheduled.

## 2023-04-17 NOTE — Assessment & Plan Note (Signed)
They attempted to discontinue Lexapro and experienced significant depressive symptoms but are currently back on Lexapro. We advised against abrupt discontinuation and will continue Lexapro 20mg  daily. Encouraged to contact if worsening symptoms, unusual behavior changes or suicidal thoughts occur.

## 2023-04-17 NOTE — Progress Notes (Signed)
Bethanie Dicker, NP-C Phone: [phone]  [name] is a 57 y.o. male who presents today for follow up.   Discussed the use of AI scribe software for clinical note transcription with the patient, who gave verbal consent to proceed.  History of Present Illness   The patient, with a history of hypertension, hyperlipidemia, COPD, and anxiety, presents for a routine follow-up. They report recent cardiology consultation with changes in their antihypertensive regimen. The patient was previously on lisinopril and amlodipine, but due to leg swelling, the regimen was changed to losartan and hydrochlorothiazide. The patient confirms adherence to the new regimen and reports satisfactory blood pressure control at home.  The patient also reports intermittent episodes of 'head rushes,' described as sudden, transient episodes of lightheadedness occurring at random times, including while driving. They deny associated chest pain, shortness of breath, or significant dizziness.  The patient is on Lexapro for anxiety, which they recently attempted to discontinue due to side effects. However, they quickly experienced a significant increase in depressive symptoms and have since resumed the medication.  The patient continues to smoke and uses an albuterol inhaler for COPD management. They report increased use of the inhaler, approximately three times a week, due to increased coughing and wheezing, particularly in the mornings. They have previously tried another inhaler, which they did not prefer over albuterol.  They are also under the care of an infectious disease specialist, but no specific issues or concerns were discussed during this visit.      Social History   Tobacco Use  Smoking Status Every Day   Current packs/day: 1.50   Average packs/day: 1.5 packs/day for 30.0 years (45.0 ttl pk-yrs)   Types: Cigarettes  Smokeless Tobacco Never  Tobacco Comments   1.5 packs daily    Current Outpatient Medications  on File Prior to Visit  Medication Sig Dispense Refill   losartan-hydrochlorothiazide (HYZAAR) 50-12.5 MG tablet Take 1 tablet by mouth daily.     albuterol (VENTOLIN HFA) 108 (90 Base) MCG/ACT inhaler TAKE 2 PUFFS BY MOUTH EVERY 6 HOURS AS NEEDED FOR WHEEZE OR SHORTNESS OF BREATH 18 each 2   escitalopram (LEXAPRO) 20 MG tablet Take 1 tablet (20 mg total) by mouth daily. 90 tablet 3   famciclovir (FAMVIR) 500 MG tablet TAKE 1 TABLET BY MOUTH TWICE DAILY DURING A FLARE UP/OUTBREAK UNTIL RESOLVED. 60 tablet 3   fenofibrate (TRICOR) 145 MG tablet Take 1 tablet (145 mg total) by mouth daily. 90 tablet 3   fluticasone (FLONASE) 50 MCG/ACT nasal spray Place 1 spray into both nostrils daily.     methocarbamol (ROBAXIN) 750 MG tablet Take 1 tablet (750 mg total) by mouth every 6 (six) hours as needed for muscle spasms. 60 tablet 5   pramipexole (MIRAPEX) 0.5 MG tablet Take 1 tablet (0.5 mg total) by mouth at bedtime. Take 2-3 hours prior to going to bed. 90 tablet 0   No current facility-administered medications on file prior to visit.     ROS see history of present illness  Objective  Physical Exam Vitals:   04/17/23 1058  BP: 132/86  Pulse: 97  Temp: 98.2 F (36.8 C)  SpO2: 97%    BP Readings from Last 3 Encounters:  04/17/23 132/86  03/17/23 119/77  01/29/23 (!) 151/80   Wt Readings from Last 3 Encounters:  04/17/23 185 lb 3.2 oz (84 kg)  03/17/23 189 lb (85.7 kg)  01/31/23 185 lb (83.9 kg)    Physical Exam Constitutional:  General: He is not in acute distress.    Appearance: Normal appearance.  HENT:     Head: Normocephalic.  Cardiovascular:     Rate and Rhythm: Normal rate and regular rhythm.     Heart sounds: Normal heart sounds.  Pulmonary:     Effort: Pulmonary effort is normal.     Breath sounds: Normal breath sounds.  Skin:    General: Skin is warm and dry.  Neurological:     General: No focal deficit present.     Mental Status: He is alert.   Psychiatric:        Mood and Affect: Mood normal.        Behavior: Behavior normal.     Assessment/Plan: Please see individual problem list.  Centrilobular emphysema (HCC) Assessment & Plan: They use an albuterol inhaler approximately three times per week and report coughing and wheezing, particularly in the mornings. We will start a maintenance inhaler for COPD and continue albuterol as needed.   Orders: -     Umeclidinium-Vilanterol; Inhale 1 puff into the lungs daily at 6 (six) AM.  Dispense: 60 each; Refill: 11  Essential hypertension Assessment & Plan: Confusion regarding antihypertensive medications was addressed. They were taking both lisinopril and losartan, but cardiology recommended switching to losartan and hydrochlorothiazide, which has controlled their blood pressure effectively. We will discontinue lisinopril and continue losartan and hydrochlorothiazide as per cardiology's recommendation.   Orders: -     CBC with Differential/Platelet -     Comprehensive metabolic panel  Anxiety and depression Assessment & Plan: They attempted to discontinue Lexapro and experienced significant depressive symptoms but are currently back on Lexapro. We advised against abrupt discontinuation and will continue Lexapro 20mg  daily. Encouraged to contact if worsening symptoms, unusual behavior changes or suicidal thoughts occur.    HIV infection, unspecified symptom status (HCC) Assessment & Plan: Managed by ID. Continue current medication regimen. Follow up as scheduled.    Hypertriglyceridemia Assessment & Plan: They are on fenofibrate for hypertriglyceridemia. We will continue fenofibrate 145mg  daily and order a lipid panel to monitor.  Orders: -     Lipid panel  Thyroid disorder screen -     TSH   Return in about 6 months (around 10/16/2023) for Follow up.   Bethanie Dicker, NP-C [facility]

## 2023-04-17 NOTE — Assessment & Plan Note (Signed)
Confusion regarding antihypertensive medications was addressed. They were taking both lisinopril and losartan, but cardiology recommended switching to losartan and hydrochlorothiazide, which has controlled their blood pressure effectively. We will discontinue lisinopril and continue losartan and hydrochlorothiazide as per cardiology's recommendation.

## 2023-04-17 NOTE — Assessment & Plan Note (Signed)
They use an albuterol inhaler approximately three times per week and report coughing and wheezing, particularly in the mornings. We will start a maintenance inhaler for COPD and continue albuterol as needed.

## 2023-05-18 NOTE — Progress Notes (Unsigned)
HPI: [name] is a 58 y.o. male who presents to the [facility] pharmacy clinic for [place] administration.  Patient Active Problem List   Diagnosis Date Noted   Anxiety and depression 04/17/2023   Anal warts 12/17/2022   Hypertriglyceridemia 07/16/2022   Lumbar radiculopathy 07/16/2022   Chronic obstructive pulmonary disease (HCC) 07/16/2022   Healthcare maintenance 03/19/2022   Obstructive sleep apnea 09/30/2021   Restless leg syndrome 09/30/2021   Elevated ferritin level 09/30/2021   Hypokalemia 05/19/2019   Polyp of transverse colon    Trochanteric bursitis of left hip 03/11/2019   Osteoarthritis of knee 03/11/2019   Cigarette nicotine dependence without complication 11/04/2018   Chronic insomnia 11/04/2018   Essential hypertension 03/24/2018   HIV (human immunodeficiency virus infection) (HCC) 03/03/2018    Patient's Medications  New Prescriptions   No medications on file  Previous Medications   ALBUTEROL (VENTOLIN HFA) 108 (90 BASE) MCG/ACT INHALER    TAKE 2 PUFFS BY MOUTH EVERY 6 HOURS AS NEEDED FOR WHEEZE OR SHORTNESS OF BREATH   ESCITALOPRAM (LEXAPRO) 20 MG TABLET    Take 1 tablet (20 mg total) by mouth daily.   FAMCICLOVIR (FAMVIR) 500 MG TABLET    TAKE 1 TABLET BY MOUTH TWICE DAILY DURING A FLARE UP/OUTBREAK UNTIL RESOLVED.   FENOFIBRATE (TRICOR) 145 MG TABLET    Take 1 tablet (145 mg total) by mouth daily.   FLUTICASONE (FLONASE) 50 MCG/ACT NASAL SPRAY    Place 1 spray into both nostrils daily.   LOSARTAN-HYDROCHLOROTHIAZIDE (HYZAAR) 50-12.5 MG TABLET    Take 1 tablet by mouth daily.   METHOCARBAMOL (ROBAXIN) 750 MG TABLET    Take 1 tablet (750 mg total) by mouth every 6 (six) hours as needed for muscle spasms.   PRAMIPEXOLE (MIRAPEX) 0.5 MG TABLET    Take 1 tablet (0.5 mg total) by mouth at bedtime. Take 2-3 hours prior to going to bed.   UMECLIDINIUM-VILANTEROL (ANORO ELLIPTA) 62.5-25 MCG/ACT AEPB    Inhale 1 puff into the lungs daily at 6 (six) AM.  Modified  Medications   No medications on file  Discontinued Medications   No medications on file    Allergies: Allergies  Allergen Reactions   Other     Dust mites and roaches     Labs: Lab Results  Component Value Date   HIV1RNAQUANT <20 (H) 03/17/2023   HIV1RNAQUANT Not Detected 11/14/2022   HIV1RNAQUANT 22 (H) 08/27/2022   CD4TABS 891 03/17/2023   CD4TABS 865 08/27/2022   CD4TABS 562 03/19/2022    RPR and STI Lab Results  Component Value Date   LABRPR NON-REACTIVE 03/17/2023   LABRPR NON-REACTIVE 08/27/2022   LABRPR NON-REACTIVE 03/19/2022   LABRPR NON-REACTIVE 03/05/2022    STI Results GC CT  08/27/2022 10:44 AM Negative  Negative   03/05/2022  9:44 AM Negative  Negative     Hepatitis B Lab Results  Component Value Date   HEPBSAB REACTIVE (A) 03/05/2022   HEPBSAG NON-REACTIVE 03/05/2022   HEPBCAB NON-REACTIVE 03/05/2022   Hepatitis C Lab Results  Component Value Date   HEPCAB NON-REACTIVE 03/05/2022   Hepatitis A Lab Results  Component Value Date   HAV REACTIVE (A) 03/05/2022   Lipids: Lab Results  Component Value Date   CHOL 124 04/17/2023   TRIG (H) 04/17/2023    454.0 Triglyceride is over 400; calculations on Lipids are invalid.   HDL 32.20 (L) 04/17/2023   CHOLHDL 4 04/17/2023   LDLCALC  03/19/2022     Comment:     .  LDL cholesterol not calculated. Triglyceride levels greater than 400 mg/dL invalidate calculated LDL results. . Reference range: <100 . Desirable range <100 mg/dL for primary prevention;   <70 mg/dL for patients with CHD or diabetic patients  with > or = 2 CHD risk factors. Marland Kitchen LDL-C is now calculated using the Martin-Hopkins  calculation, which is a validated novel method providing  better accuracy than the Friedewald equation in the  estimation of LDL-C.  Horald Pollen et al. Lenox Ahr. 7846;962(95): 2061-2068  (http://education.[employer].com/faq/FAQ164)     TARGET DATE: 29th day of the month  Assessment: Sky presents  today for his maintenance Cabenuva injections. Past injections were tolerated well without issues. Last HIV RNA was undetectable from 03/17/23. From the same visit, his CD4 was 891. Last STI testing was on 03/17/23 with an RPR which was non-reactive. Urine cytology from 08/2022 was negative for chlamydia/gonorrhea. Denies signs and symptoms of STI today. *** Agrees to full STI testing with RPR and oral/urine/rectal cytologies today.   Vaccination: Received PCV20 on last visit with Greg on 03/17/23. Declined influenza and COVID-19 vaccines on last visit with  Cassie on 01/14/23.   Labs: Most recent CMP, CBC, and lipid panel was on 04/17/23. His Na+ was low at 132 and his triglycerides (454) and LDL (209) were elevated. Will defer labs due to most recent labs being from a month ago.   Administered cabotegravir 600mg /9mL in left upper outer quadrant of the gluteal muscle. Administered rilpivirine 900 mg/56mL in the right upper outer quadrant of the gluteal muscle. No issues with injections. He will follow up in 2 months for next set of injections.  Plan: - Cabenuva injections administered - Next injections scheduled for *** - Call with any issues or questions  Dimple Casey. Edwena Blow, PharmD Candidate [employer]

## 2023-05-19 ENCOUNTER — Other Ambulatory Visit (HOSPITAL_COMMUNITY): Payer: Self-pay

## 2023-05-19 ENCOUNTER — Ambulatory Visit: Payer: BC Managed Care – PPO | Admitting: Pharmacist

## 2023-05-20 NOTE — Progress Notes (Unsigned)
HPI: [name] is a 58 y.o. male who presents to the [facility] pharmacy clinic for [place] administration.  Patient Active Problem List   Diagnosis Date Noted   Anxiety and depression 04/17/2023   Anal warts 12/17/2022   Hypertriglyceridemia 07/16/2022   Lumbar radiculopathy 07/16/2022   Chronic obstructive pulmonary disease (HCC) 07/16/2022   Healthcare maintenance 03/19/2022   Obstructive sleep apnea 09/30/2021   Restless leg syndrome 09/30/2021   Elevated ferritin level 09/30/2021   Hypokalemia 05/19/2019   Polyp of transverse colon    Trochanteric bursitis of left hip 03/11/2019   Osteoarthritis of knee 03/11/2019   Cigarette nicotine dependence without complication 11/04/2018   Chronic insomnia 11/04/2018   Essential hypertension 03/24/2018   HIV (human immunodeficiency virus infection) (HCC) 03/03/2018    Patient's Medications  New Prescriptions   No medications on file  Previous Medications   ALBUTEROL (VENTOLIN HFA) 108 (90 BASE) MCG/ACT INHALER    TAKE 2 PUFFS BY MOUTH EVERY 6 HOURS AS NEEDED FOR WHEEZE OR SHORTNESS OF BREATH   ESCITALOPRAM (LEXAPRO) 20 MG TABLET    Take 1 tablet (20 mg total) by mouth daily.   FAMCICLOVIR (FAMVIR) 500 MG TABLET    TAKE 1 TABLET BY MOUTH TWICE DAILY DURING A FLARE UP/OUTBREAK UNTIL RESOLVED.   FENOFIBRATE (TRICOR) 145 MG TABLET    Take 1 tablet (145 mg total) by mouth daily.   FLUTICASONE (FLONASE) 50 MCG/ACT NASAL SPRAY    Place 1 spray into both nostrils daily.   LOSARTAN-HYDROCHLOROTHIAZIDE (HYZAAR) 50-12.5 MG TABLET    Take 1 tablet by mouth daily.   METHOCARBAMOL (ROBAXIN) 750 MG TABLET    Take 1 tablet (750 mg total) by mouth every 6 (six) hours as needed for muscle spasms.   PRAMIPEXOLE (MIRAPEX) 0.5 MG TABLET    Take 1 tablet (0.5 mg total) by mouth at bedtime. Take 2-3 hours prior to going to bed.   UMECLIDINIUM-VILANTEROL (ANORO ELLIPTA) 62.5-25 MCG/ACT AEPB    Inhale 1 puff into the lungs daily at 6 (six) AM.  Modified  Medications   No medications on file  Discontinued Medications   No medications on file    Allergies: Allergies  Allergen Reactions   Other     Dust mites and roaches     Labs: Lab Results  Component Value Date   HIV1RNAQUANT <20 (H) 03/17/2023   HIV1RNAQUANT Not Detected 11/14/2022   HIV1RNAQUANT 22 (H) 08/27/2022   CD4TABS 891 03/17/2023   CD4TABS 865 08/27/2022   CD4TABS 562 03/19/2022    RPR and STI Lab Results  Component Value Date   LABRPR NON-REACTIVE 03/17/2023   LABRPR NON-REACTIVE 08/27/2022   LABRPR NON-REACTIVE 03/19/2022   LABRPR NON-REACTIVE 03/05/2022    STI Results GC CT  08/27/2022 10:44 AM Negative  Negative   03/05/2022  9:44 AM Negative  Negative     Hepatitis B Lab Results  Component Value Date   HEPBSAB REACTIVE (A) 03/05/2022   HEPBSAG NON-REACTIVE 03/05/2022   HEPBCAB NON-REACTIVE 03/05/2022   Hepatitis C Lab Results  Component Value Date   HEPCAB NON-REACTIVE 03/05/2022   Hepatitis A Lab Results  Component Value Date   HAV REACTIVE (A) 03/05/2022   Lipids: Lab Results  Component Value Date   CHOL 124 04/17/2023   TRIG (H) 04/17/2023    454.0 Triglyceride is over 400; calculations on Lipids are invalid.   HDL 32.20 (L) 04/17/2023   CHOLHDL 4 04/17/2023   LDLCALC  03/19/2022     Comment:     .  LDL cholesterol not calculated. Triglyceride levels greater than 400 mg/dL invalidate calculated LDL results. . Reference range: <100 . Desirable range <100 mg/dL for primary prevention;   <70 mg/dL for patients with CHD or diabetic patients  with > or = 2 CHD risk factors. Marland Kitchen LDL-C is now calculated using the Martin-Hopkins  calculation, which is a validated novel method providing  better accuracy than the Friedewald equation in the  estimation of LDL-C.  Horald Pollen et al. Lenox Ahr. 1610;960(45): 2061-2068  (http://education.[facility].com/faq/FAQ164)     TARGET DATE: 29th day of the month  Assessment: Armanii presents  today for his maintenance Cabenuva injections. Past injections were tolerated well without issues. Last HIV RNA was undetectable from 03/17/23. From the same visit, his CD4 was 891. Last STI testing was on 03/17/23 with an RPR which was non-reactive. Urine cytology from 08/2022 was negative for chlamydia/gonorrhea. Denies signs and symptoms of STI today. Declined STI testing today, stating no recent exposure or sexual activity.   He also shared that he received a letter from [employer] stating his Cabenuva shots from 03/17/23 were not covered. He told us that he recently changed his insurance plan but it's still with [employer]. Provided him the email and fax number of one of [facility] pharmacy's patient advocates so he can send [place] a copy of the letter he received. We will investigate and follow up with him after we receive a copy of the letter.    Vaccination: Received PCV20 on last visit with Greg on 03/17/23. Declined influenza and COVID-19 vaccines on last visit with Cassie on 01/14/23. Declined both vaccines again today.    Labs: Most recent CMP, CBC, and lipid panel was on 04/17/23. His Na+ was low at 132 and his triglycerides (454) and LDL (209) were elevated. Will defer labs due to most recent labs being from a month ago.   Administered cabotegravir 600mg /1mL in right upper outer quadrant of the gluteal muscle. Administered rilpivirine 900 mg/20mL in the left upper outer quadrant of the gluteal muscle. No issues with injections. He will follow up in 2 months for next set of injections.  Plan: - Cabenuva injections administered - Provided Donna's ([facility] pharmacy patient advocate) contact information  - Next injections scheduled for 07/15/23 with Marchelle Folks - Call with any issues or questions  Dimple Casey. Edwena Blow, PharmD Candidate [employer] [facility] [employer]

## 2023-05-21 ENCOUNTER — Telehealth: Payer: Self-pay

## 2023-05-21 ENCOUNTER — Other Ambulatory Visit (HOSPITAL_COMMUNITY): Payer: Self-pay

## 2023-05-21 ENCOUNTER — Ambulatory Visit: Payer: BC Managed Care – PPO | Admitting: Pharmacist

## 2023-05-21 ENCOUNTER — Other Ambulatory Visit: Payer: Self-pay

## 2023-05-21 DIAGNOSIS — B20 Human immunodeficiency virus [HIV] disease: Secondary | ICD-10-CM | POA: Diagnosis not present

## 2023-05-21 DIAGNOSIS — Z21 Asymptomatic human immunodeficiency virus [HIV] infection status: Secondary | ICD-10-CM

## 2023-05-21 MED ORDER — CABOTEGRAVIR & RILPIVIRINE ER 600 & 900 MG/3ML IM SUER
1.0000 | Freq: Once | INTRAMUSCULAR | Status: AC
  Administered 2023-05-21: 1 via INTRAMUSCULAR

## 2023-05-21 NOTE — Telephone Encounter (Signed)
[facility] Patient Advocate Encounter   Received notification from [place] that prior authorization for [name] is required. (Medical Benefits)               [facility] will continue to follow.   Clearance Coots, CPhT Specialty Pharmacy Patient [facility] for Infectious Disease Phone: [phone] Fax:  [phone]

## 2023-05-27 ENCOUNTER — Other Ambulatory Visit (HOSPITAL_COMMUNITY): Payer: Self-pay

## 2023-06-02 DIAGNOSIS — I10 Essential (primary) hypertension: Secondary | ICD-10-CM | POA: Diagnosis not present

## 2023-06-02 NOTE — Telephone Encounter (Signed)
 Medical PA for Cabenuva  denied - submitted and faxed appeal today.  Will await response. Mylinda Asa

## 2023-06-12 ENCOUNTER — Other Ambulatory Visit (HOSPITAL_COMMUNITY): Payer: Self-pay

## 2023-06-12 ENCOUNTER — Telehealth: Payer: Self-pay

## 2023-06-12 DIAGNOSIS — Z21 Asymptomatic human immunodeficiency virus [HIV] infection status: Secondary | ICD-10-CM

## 2023-06-12 MED ORDER — CABOTEGRAVIR & RILPIVIRINE ER 600 & 900 MG/3ML IM SUER: 1.0000 | INTRAMUSCULAR | 5 refills | Status: AC

## 2023-06-12 NOTE — Addendum Note (Signed)
Addended by: Jennette Kettle on: 06/12/2023 11:20 AM   Modules accepted: Orders

## 2023-06-12 NOTE — Telephone Encounter (Signed)
Pharmacy Patient Advocate Encounter- Cabenuva BIV-Pharmacy Benefit:  PA was submitted to [employer] and has been approved through: 06/10/23-06/08/24 Authorization# [id number]  Please send prescription to Specialty Pharmacy:  [facility] [place] Estimated Copay is: 0.00

## 2023-06-13 ENCOUNTER — Telehealth: Payer: Self-pay

## 2023-06-13 NOTE — Telephone Encounter (Signed)
Spoke with Royal Hawthorn, let him know our pharmacy team would [facility] of Cabenuva to the clinic.   Sandie Ano, RN

## 2023-06-13 NOTE — Telephone Encounter (Signed)
Patient called, states he received a message from [facility] stating his [name] was ready for pick up.   Sandie Ano, RN

## 2023-06-26 ENCOUNTER — Other Ambulatory Visit: Payer: Self-pay | Admitting: Nurse Practitioner

## 2023-06-26 ENCOUNTER — Other Ambulatory Visit: Payer: Self-pay | Admitting: Family

## 2023-06-26 NOTE — Telephone Encounter (Signed)
 Patient has PCP.

## 2023-07-10 ENCOUNTER — Telehealth: Payer: Self-pay

## 2023-07-10 NOTE — Telephone Encounter (Signed)
 [facility] Patient Advocate Encounter  Patient's medications CABENUVA have been couriered to [facility] from [facility] and will be administered at the patients appointment on 07/15/23.  Kae Heller, CPhT Specialty Pharmacy Patient [facility] for Infectious Disease Phone: [phone] Fax:  [phone]

## 2023-07-14 NOTE — Progress Notes (Unsigned)
 HPI: [name] is a 58 y.o. male who presents to the [facility] pharmacy clinic for [place] administration.  Patient Active Problem List   Diagnosis Date Noted   Anxiety and depression 04/17/2023   Anal warts 12/17/2022   Hypertriglyceridemia 07/16/2022   Lumbar radiculopathy 07/16/2022   Chronic obstructive pulmonary disease (HCC) 07/16/2022   Healthcare maintenance 03/19/2022   Obstructive sleep apnea 09/30/2021   Restless leg syndrome 09/30/2021   Elevated ferritin level 09/30/2021   Hypokalemia 05/19/2019   Polyp of transverse colon    Trochanteric bursitis of left hip 03/11/2019   Osteoarthritis of knee 03/11/2019   Cigarette nicotine dependence without complication 11/04/2018   Chronic insomnia 11/04/2018   Essential hypertension 03/24/2018   HIV (human immunodeficiency virus infection) (HCC) 03/03/2018    Patient's Medications  New Prescriptions   No medications on file  Previous Medications   ALBUTEROL (VENTOLIN HFA) 108 (90 BASE) MCG/ACT INHALER    TAKE 2 PUFFS BY MOUTH EVERY 6 HOURS AS NEEDED FOR WHEEZE OR SHORTNESS OF BREATH   CABOTEGRAVIR & RILPIVIRINE ER (CABENUVA) 600 & 900 MG/3ML INJECTION    Inject 1 kit into the muscle every 2 (two) months.   ESCITALOPRAM (LEXAPRO) 20 MG TABLET    Take 1 tablet (20 mg total) by mouth daily.   FAMCICLOVIR (FAMVIR) 500 MG TABLET    TAKE 1 TABLET BY MOUTH TWICE DAILY DURING A FLARE UP/OUTBREAK UNTIL RESOLVED.   FENOFIBRATE (TRICOR) 145 MG TABLET    Take 1 tablet (145 mg total) by mouth daily.   FLUTICASONE (FLONASE) 50 MCG/ACT NASAL SPRAY    Place 1 spray into both nostrils daily.   LOSARTAN-HYDROCHLOROTHIAZIDE (HYZAAR) 50-12.5 MG TABLET    Take 1 tablet by mouth daily.   METHOCARBAMOL (ROBAXIN) 750 MG TABLET    Take 1 tablet (750 mg total) by mouth every 6 (six) hours as needed for muscle spasms.   PRAMIPEXOLE (MIRAPEX) 0.5 MG TABLET    Take 1 tablet (0.5 mg total) by mouth at bedtime. Take 2-3 hours prior to going to bed.    UMECLIDINIUM-VILANTEROL (ANORO ELLIPTA) 62.5-25 MCG/ACT AEPB    Inhale 1 puff into the lungs daily at 6 (six) AM.  Modified Medications   No medications on file  Discontinued Medications   No medications on file    Allergies: Allergies  Allergen Reactions   Other     Dust mites and roaches     Past Medical History: Past Medical History:  Diagnosis Date   Allergy    Colon polyps    COPD (chronic obstructive pulmonary disease) (HCC)    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   Genital warts    HIV (human immunodeficiency virus infection) (HCC)    Hypertension    Sleep apnea     Social History: Social History   Socioeconomic History   Marital status: Divorced    Spouse name: Not on file   Number of children: Not on file   Years of education: Not on file   Highest education level: Associate degree: occupational, Scientist, product/process development, or vocational program  Occupational History   Not on file  Tobacco Use   Smoking status: Every Day    Current packs/day: 1.50    Average packs/day: 1.5 packs/day for 30.0 years (45.0 ttl pk-yrs)    Types: Cigarettes   Smokeless tobacco: Never   Tobacco comments:    1.5 packs daily  Vaping Use   Vaping status: Never Used  Substance and Sexual  Activity   Alcohol use: Yes    Alcohol/week: 21.0 standard drinks of alcohol    Types: 21 Shots of liquor per week    Comment: weekends   Drug use: Never   Sexual activity: Not Currently    Comment: declined condoms  Other Topics Concern   Not on file  Social History Narrative   Not on file   Social Drivers of Health   Financial Resource Strain: Medium Risk (04/13/2023)   Overall Financial Resource Strain (CARDIA)    Difficulty of Paying Living Expenses: Somewhat hard  Food Insecurity: Food Insecurity Present (04/13/2023)   Hunger Vital Sign    Worried About Running Out of Food in the Last Year: Sometimes true    Ran Out of Food in the Last Year: Sometimes true  Transportation Needs: No  Transportation Needs (04/13/2023)   PRAPARE - Administrator, Civil Service (Medical): No    Lack of Transportation (Non-Medical): No  Physical Activity: Insufficiently Active (04/13/2023)   Exercise Vital Sign    Days of Exercise per Week: 2 days    Minutes of Exercise per Session: 10 min  Stress: No Stress Concern Present (04/13/2023)   [employer] of Occupational Health - Occupational Stress Questionnaire    Feeling of Stress : Only a little  Social Connections: Unknown (04/13/2023)   Social Connection and Isolation Panel [NHANES]    Frequency of Communication with Friends and Family: Patient declined    Frequency of Social Gatherings with Friends and Family: Patient declined    Attends Religious Services: Never    Database administrator or Organizations: No    Attends Engineer, structural: Not on file    Marital Status: Divorced    Labs: Lab Results  Component Value Date   HIV1RNAQUANT <20 (H) 03/17/2023   HIV1RNAQUANT Not Detected 11/14/2022   HIV1RNAQUANT 22 (H) 08/27/2022   CD4TABS 891 03/17/2023   CD4TABS 865 08/27/2022   CD4TABS 562 03/19/2022    RPR and STI Lab Results  Component Value Date   LABRPR NON-REACTIVE 03/17/2023   LABRPR NON-REACTIVE 08/27/2022   LABRPR NON-REACTIVE 03/19/2022   LABRPR NON-REACTIVE 03/05/2022    STI Results GC CT  08/27/2022 10:44 AM Negative  Negative   03/05/2022  9:44 AM Negative  Negative     Hepatitis B Lab Results  Component Value Date   HEPBSAB REACTIVE (A) 03/05/2022   HEPBSAG NON-REACTIVE 03/05/2022   HEPBCAB NON-REACTIVE 03/05/2022   Hepatitis C Lab Results  Component Value Date   HEPCAB NON-REACTIVE 03/05/2022   Hepatitis A Lab Results  Component Value Date   HAV REACTIVE (A) 03/05/2022   Lipids: Lab Results  Component Value Date   CHOL 124 04/17/2023   TRIG (H) 04/17/2023    454.0 Triglyceride is over 400; calculations on Lipids are invalid.   HDL 32.20 (L) 04/17/2023    CHOLHDL 4 04/17/2023   LDLCALC  03/19/2022     Comment:     . LDL cholesterol not calculated. Triglyceride levels greater than 400 mg/dL invalidate calculated LDL results. . Reference range: <100 . Desirable range <100 mg/dL for primary prevention;   <70 mg/dL for patients with CHD or diabetic patients  with > or = 2 CHD risk factors. Marland Kitchen LDL-C is now calculated using the Martin-Hopkins  calculation, which is a validated novel method providing  better accuracy than the Friedewald equation in the  estimation of LDL-C.  Horald Pollen et al. Lenox Ahr. 1610;960(45): 2061-2068  (http://education.[facility].com/faq/FAQ164)  TARGET DATE:  The 29th of the month  Assessment: Pasquale presents today for their maintenance Cabenuva injections. States he has noted significant change in his energy levels and sleep since starting Cabenuva. Describes consistent feeling of fatigue, asthenia, and inability to stay asleep. These were reported in ~1% of the 600 patients (~6 patients) who received Cabenuva in the FLAIR and ATLAS trials. He also has multiple other risk factors for these symptoms including restless leg syndrome and sleep apnea. He would like to continue Cabenuva at this time but can discuss concerns further with Tammy Sours at his next visit. States another provider has prescribed sleep medication that has not helped him.   Also states he has questions about another ~$1300 bill he received ~4 weeks ago; sent message to Lupita Leash for review as she has worked with his insurance barriers before.   Administered cabotegravir 600mg /35mL in left upper outer quadrant of the gluteal muscle. Administered rilpivirine 900 mg/69mL in the right upper outer quadrant of the gluteal muscle. Monitored patient for 10 minutes after injection. Injections were tolerated well without issue. Patient will follow up in 2 months for next injection. Will defer HIV RNA testing until next visit.  Plan: - Cabenuva injections  administered - Next injections scheduled for 5/23 with Tammy Sours and 7/22 with Cassie  - Call with any issues or questions  Margarite Gouge, PharmD, CPP, BCIDP, AAHIVP Clinical Pharmacist Practitioner Infectious Diseases Clinical Pharmacist [facility]

## 2023-07-15 ENCOUNTER — Ambulatory Visit: Payer: BC Managed Care – PPO | Admitting: Pharmacist

## 2023-07-17 ENCOUNTER — Ambulatory Visit: Admitting: Pharmacist

## 2023-07-17 ENCOUNTER — Other Ambulatory Visit: Payer: Self-pay

## 2023-07-17 DIAGNOSIS — B20 Human immunodeficiency virus [HIV] disease: Secondary | ICD-10-CM | POA: Diagnosis not present

## 2023-07-17 DIAGNOSIS — Z21 Asymptomatic human immunodeficiency virus [HIV] infection status: Secondary | ICD-10-CM

## 2023-07-17 MED ORDER — CABOTEGRAVIR & RILPIVIRINE ER 600 & 900 MG/3ML IM SUER
1.0000 | Freq: Once | INTRAMUSCULAR | Status: AC
  Administered 2023-07-17: 1 via INTRAMUSCULAR

## 2023-08-20 ENCOUNTER — Other Ambulatory Visit: Payer: Self-pay | Admitting: Nurse Practitioner

## 2023-08-20 DIAGNOSIS — M5416 Radiculopathy, lumbar region: Secondary | ICD-10-CM

## 2023-08-24 ENCOUNTER — Other Ambulatory Visit: Payer: Self-pay | Admitting: Nurse Practitioner

## 2023-08-24 DIAGNOSIS — I10 Essential (primary) hypertension: Secondary | ICD-10-CM

## 2023-09-03 NOTE — Progress Notes (Signed)
 The ASCVD Risk score (Arnett DK, et al., 2019) failed to calculate for the following reasons:   The valid total cholesterol range is 130 to 320 mg/dL  Arlon Bergamo, BSN, RN

## 2023-09-10 ENCOUNTER — Telehealth: Payer: Self-pay

## 2023-09-10 NOTE — Telephone Encounter (Signed)
 [facility] Patient Advocate Encounter  Patient's medications CABENUVA  have been [facility] to [facility] and will be administered at the patients appointment on 09/12/23.  Verline Glow, CPhT Specialty Pharmacy Patient [facility] for Infectious Disease Phone: [phone] Fax:  [phone]

## 2023-09-12 ENCOUNTER — Encounter: Payer: Self-pay | Admitting: Family

## 2023-09-12 ENCOUNTER — Other Ambulatory Visit: Payer: Self-pay

## 2023-09-12 ENCOUNTER — Ambulatory Visit: Admitting: Family

## 2023-09-12 VITALS — BP 125/80 | HR 67 | Temp 97.9°F | Ht 69.5 in | Wt 186.0 lb

## 2023-09-12 DIAGNOSIS — Z0000 Encounter for general adult medical examination without abnormal findings: Secondary | ICD-10-CM | POA: Diagnosis not present

## 2023-09-12 DIAGNOSIS — Z21 Asymptomatic human immunodeficiency virus [HIV] infection status: Secondary | ICD-10-CM

## 2023-09-12 MED ORDER — CABOTEGRAVIR & RILPIVIRINE ER 600 & 900 MG/3ML IM SUER
1.0000 | Freq: Once | INTRAMUSCULAR | Status: AC
  Administered 2023-09-12: 1 via INTRAMUSCULAR

## 2023-09-12 NOTE — Assessment & Plan Note (Signed)
 Discussed importance of safe sexual practice and condom use. Condoms and site specific STD testing offered.  Vaccinations are up to date Routine dental care up to date. Lung cancer screening completed last November.  Colon cancer screening up to date.

## 2023-09-12 NOTE — Assessment & Plan Note (Signed)
 [name] continues to have well controlled virus with good adherence and tolerance to Cabenuva . Reviewed previous lab work and discussed plan of care and U equals U. Social determinants of health reviewed with no interventions indicated. Cabenuva  injection provided with no complications. Check lab work. Plan for follow up in 6 months or sooner if needed with lab work 1-2 weeks prior to appointment and with Pharmacy providers in between.

## 2023-09-12 NOTE — Progress Notes (Signed)
 Brief Narrative   Patient ID: [name], male    DOB: 10-12-1965, 58 y.o.   MRN: [id number]  [name] is a 58 y/o caucasian male diagnosed with HIV-1 disease in 2007 with CD4 count <100 and viral load >1 million. No genosure available. Entered care at [facility] Stage 3. [id number] negative. ART experienced with Abavavir/lamivudine and raltegravir, Triumeq and Biktarvy    Subjective:   Chief Complaint  Patient presents with   Follow-up    B20    HPI:  [name] is a 58 y.o. male with HIV disease last seen on 07/17/2023 by Nicklas Barns, PharmD, CPP with well-tolerated and good adherence to Cabenuva .  Last lab work completed 03/17/2023 with well-controlled virus and CD4 count of 891.  Kidney function, liver function, electrolytes within normal ranges.  RPR was nonreactive for syphilis.  Here today for routine follow-up.  [name] has been doing okay since his last office visit and continues to receive Cabenuva  with no adverse side effects. No new concerns/complaints. Housing, transportation and access to food are stable. Routine dental care up to date. Health care maintenance reviewed. Site specific STD testing and condoms offered.   Denies fevers, chills, night sweats, headaches, changes in vision, neck pain/stiffness, nausea, diarrhea, vomiting, lesions or rashes.  Lab Results  Component Value Date   CD4TCELL 43 03/17/2023   CD4TABS 891 03/17/2023   Lab Results  Component Value Date   HIV1RNAQUANT <20 (H) 03/17/2023     Allergies  Allergen Reactions   Other     Dust mites and roaches       Outpatient Medications Prior to Visit  Medication Sig Dispense Refill   albuterol  (VENTOLIN  HFA) 108 (90 Base) MCG/ACT inhaler TAKE 2 PUFFS BY MOUTH EVERY 6 HOURS AS NEEDED FOR WHEEZE OR SHORTNESS OF BREATH 18 each 2   cabotegravir  & rilpivirine  ER (CABENUVA ) 600 & 900 MG/3ML injection Inject 1 kit into the muscle every 2 (two) months. 6 mL 5   escitalopram  (LEXAPRO ) 20 MG tablet Take 1  tablet (20 mg total) by mouth daily. 90 tablet 3   famciclovir  (FAMVIR ) 500 MG tablet TAKE 1 TABLET BY MOUTH TWICE DAILY DURING A FLARE UP/OUTBREAK UNTIL RESOLVED. 60 tablet 3   fenofibrate  (TRICOR ) 145 MG tablet Take 1 tablet (145 mg total) by mouth daily. 90 tablet 3   fluticasone  (FLONASE ) 50 MCG/ACT nasal spray Place 1 spray into both nostrils daily.     losartan-hydrochlorothiazide  (HYZAAR) 50-12.5 MG tablet Take 1 tablet by mouth daily.     methocarbamol  (ROBAXIN ) 750 MG tablet TAKE 1 TABLET (750 MG TOTAL) BY MOUTH EVERY 6 (SIX) HOURS AS NEEDED FOR MUSCLE SPASMS 60 tablet 5   pramipexole  (MIRAPEX ) 0.5 MG tablet Take 1 tablet (0.5 mg total) by mouth at bedtime. Take 2-3 hours prior to going to bed. 90 tablet 0   umeclidinium-vilanterol (ANORO ELLIPTA ) 62.5-25 MCG/ACT AEPB Inhale 1 puff into the lungs daily at 6 (six) AM. 60 each 11   No facility-administered medications prior to visit.     Past Medical History:  Diagnosis Date   Allergy    Colon polyps    COPD (chronic obstructive pulmonary disease) (HCC)    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   Genital warts    HIV (human immunodeficiency virus infection) (HCC)    Hypertension    Sleep apnea      Past Surgical History:  Procedure Laterality Date   COLONOSCOPY WITH PROPOFOL  N/A 03/22/2019  Procedure: COLONOSCOPY WITH BIOPSIES;  Surgeon: Marnee Sink, MD;  Location: [facility];  Service: Endoscopy;  Laterality: N/A;   I & D EXTREMITY Left 05/21/2019   Procedure: IRRIGATION AND DEBRIDEMENT LEFT LEG;  Surgeon: Eldred Grego, MD;  Location: [facility];  Service: General;  Laterality: Left;   I & D EXTREMITY Left 05/25/2019   Procedure: IRRIGATION AND DEBRIDEMENT Left Leg;  Surgeon: Eldred Grego, MD;  Location: [facility];  Service: General;  Laterality: Left;   POLYPECTOMY N/A 03/22/2019   Procedure: POLYPECTOMY;  Surgeon: Marnee Sink, MD;  Location: [facility];  Service: Endoscopy;  Laterality:  N/A;        Review of Systems  Constitutional:  Negative for appetite change, chills, fatigue, fever and unexpected weight change.  Eyes:  Negative for visual disturbance.  Respiratory:  Negative for cough, chest tightness, shortness of breath and wheezing.   Cardiovascular:  Negative for chest pain and leg swelling.  Gastrointestinal:  Negative for abdominal pain, constipation, diarrhea, nausea and vomiting.  Genitourinary:  Negative for dysuria, flank pain, frequency, genital sores, hematuria and urgency.  Skin:  Negative for rash.  Allergic/Immunologic: Negative for immunocompromised state.  Neurological:  Negative for dizziness and headaches.     Objective:   BP 125/80   Pulse 67   Temp 97.9 F (36.6 C) (Oral)   Ht 5' 9.5" (1.765 m)   Wt 186 lb (84.4 kg)   SpO2 97%   BMI 27.07 kg/m  Nursing note and vital signs reviewed.  Physical Exam Constitutional:      General: He is not in acute distress.    Appearance: He is well-developed.  Eyes:     Conjunctiva/sclera: Conjunctivae normal.  Cardiovascular:     Rate and Rhythm: Normal rate and regular rhythm.     Heart sounds: Normal heart sounds. No murmur heard.    No friction rub. No gallop.  Pulmonary:     Effort: Pulmonary effort is normal. No respiratory distress.     Breath sounds: Normal breath sounds. No wheezing or rales.  Chest:     Chest wall: No tenderness.  Abdominal:     General: Bowel sounds are normal.     Palpations: Abdomen is soft.     Tenderness: There is no abdominal tenderness.  Musculoskeletal:     Cervical back: Neck supple.  Lymphadenopathy:     Cervical: No cervical adenopathy.  Skin:    General: Skin is warm and dry.     Findings: No rash.  Neurological:     Mental Status: He is alert and oriented to person, place, and time.  Psychiatric:        Behavior: Behavior normal.        Thought Content: Thought content normal.        Judgment: Judgment normal.          04/17/2023    11:00 AM 03/17/2023   10:25 AM 07/16/2022    9:07 AM 04/16/2022    9:40 AM 03/19/2022    8:58 AM  Depression screen PHQ 2/9  Decreased Interest 1 0 1 0 0  Down, Depressed, Hopeless 0 0 0 0 0  PHQ - 2 Score 1 0 1 0 0  Altered sleeping 1  2    Tired, decreased energy 1  1    Change in appetite 0  0    Feeling bad or failure about yourself  0  0    Trouble concentrating 0  1  Moving slowly or fidgety/restless 0  0    Suicidal thoughts 0  0    PHQ-9 Score 3  5    Difficult doing work/chores Not difficult at all  Not difficult at all          04/17/2023   11:00 AM 07/16/2022    9:07 AM  GAD 7 : Generalized Anxiety Score  Nervous, Anxious, on Edge 0 0  Control/stop worrying 0 0  Worry too much - different things 0 0  Trouble relaxing 0 0  Restless 1 3  Easily annoyed or irritable 1 1  Afraid - awful might happen 0 0  Total GAD 7 Score 2 4  Anxiety Difficulty Not difficult at all Not difficult at all     The ASCVD Risk score (Arnett DK, et al., 2019) failed to calculate for the following reasons:   The valid total cholesterol range is 130 to 320 mg/dL      Assessment & Plan:    Patient Active Problem List   Diagnosis Date Noted   Anxiety and depression 04/17/2023   Anal warts 12/17/2022   Hypertriglyceridemia 07/16/2022   Lumbar radiculopathy 07/16/2022   Chronic obstructive pulmonary disease (HCC) 07/16/2022   Healthcare maintenance 03/19/2022   Obstructive sleep apnea 09/30/2021   Restless leg syndrome 09/30/2021   Elevated ferritin level 09/30/2021   Hypokalemia 05/19/2019   Polyp of transverse colon    Trochanteric bursitis of left hip 03/11/2019   Osteoarthritis of knee 03/11/2019   Cigarette nicotine dependence without complication 11/04/2018   Chronic insomnia 11/04/2018   Essential hypertension 03/24/2018   HIV (human immunodeficiency virus infection) (HCC) 03/03/2018     Problem List Items Addressed This Visit       Other   HIV (human  immunodeficiency virus infection) (HCC) - Primary   [name] continues to have well controlled virus with good adherence and tolerance to Cabenuva . Reviewed previous lab work and discussed plan of care and U equals U. Social determinants of health reviewed with no interventions indicated. Cabenuva  injection provided with no complications. Check lab work. Plan for follow up in 6 months or sooner if needed with lab work 1-2 weeks prior to appointment and with Pharmacy providers in between.      Relevant Orders   HIV-1 RNA quant-no reflex-bld   T-helper cells (CD4) count (not at [facility])   Comprehensive metabolic panel with GFR   HIV-1 RNA quant-no reflex-bld   T-helper cell (CD4)- ([facility] clinic only)   Healthcare maintenance   Discussed importance of safe sexual practice and condom use. Condoms and site specific STD testing offered.  Vaccinations are up to date Routine dental care up to date. Lung cancer screening completed last November.  Colon cancer screening up to date.         I am having Lucia Russian maintain his fluticasone , famciclovir , pramipexole , albuterol , fenofibrate , escitalopram , losartan-hydrochlorothiazide , umeclidinium-vilanterol, cabotegravir  & rilpivirine  ER, and methocarbamol . We administered cabotegravir  & rilpivirine  ER.   Meds ordered this encounter  Medications   cabotegravir  & rilpivirine  ER (CABENUVA ) 600 & 900 MG/3ML injection 1 kit     Follow-up: Return in about 6 months (around 03/14/2024). or sooner if needed.    Marlan Silva, MSN, FNP-C Nurse Practitioner [facility] for Infectious Disease [facility] Main number: [phone]

## 2023-09-12 NOTE — Patient Instructions (Signed)
 Nice to see you.  We will check your lab work today.  Continue to take your medication daily as prescribed.  Plan for follow up in 6 months or sooner if needed with lab work 1-2 weeks prior to appointment and with Pharmacy Provider in between.   Have a great day and stay safe!

## 2023-09-15 LAB — T-HELPER CELLS (CD4) COUNT (NOT AT ARMC)
Absolute CD4: 1028 {cells}/uL (ref 490–1740)
CD4 T Helper %: 42 % (ref 30–61)
Total lymphocyte count: 2466 {cells}/uL (ref 850–3900)

## 2023-09-15 LAB — HIV-1 RNA QUANT-NO REFLEX-BLD
HIV 1 RNA Quant: NOT DETECTED {copies}/mL
HIV-1 RNA Quant, Log: NOT DETECTED {Log_copies}/mL

## 2023-09-16 ENCOUNTER — Ambulatory Visit: Payer: Self-pay | Admitting: Family

## 2023-10-06 ENCOUNTER — Other Ambulatory Visit: Payer: Self-pay | Admitting: Nurse Practitioner

## 2023-10-06 DIAGNOSIS — E781 Pure hyperglyceridemia: Secondary | ICD-10-CM

## 2023-10-16 ENCOUNTER — Ambulatory Visit: Payer: BC Managed Care – PPO | Admitting: Nurse Practitioner

## 2023-10-16 ENCOUNTER — Encounter: Payer: Self-pay | Admitting: Nurse Practitioner

## 2023-10-16 VITALS — BP 96/60 | HR 54 | Temp 98.2°F | Ht 69.5 in | Wt 185.4 lb

## 2023-10-16 DIAGNOSIS — J432 Centrilobular emphysema: Secondary | ICD-10-CM | POA: Diagnosis not present

## 2023-10-16 DIAGNOSIS — E781 Pure hyperglyceridemia: Secondary | ICD-10-CM | POA: Diagnosis not present

## 2023-10-16 DIAGNOSIS — F419 Anxiety disorder, unspecified: Secondary | ICD-10-CM

## 2023-10-16 DIAGNOSIS — F17210 Nicotine dependence, cigarettes, uncomplicated: Secondary | ICD-10-CM

## 2023-10-16 DIAGNOSIS — I10 Essential (primary) hypertension: Secondary | ICD-10-CM

## 2023-10-16 DIAGNOSIS — M79671 Pain in right foot: Secondary | ICD-10-CM

## 2023-10-16 DIAGNOSIS — R7989 Other specified abnormal findings of blood chemistry: Secondary | ICD-10-CM | POA: Diagnosis not present

## 2023-10-16 DIAGNOSIS — F32A Depression, unspecified: Secondary | ICD-10-CM

## 2023-10-16 LAB — LIPID PANEL
Cholesterol: 148 mg/dL (ref 0–200)
HDL: 35 mg/dL — ABNORMAL LOW (ref >39.00)
NonHDL: 112.59
Total CHOL/HDL Ratio: 4
Triglycerides: 579 mg/dL — ABNORMAL HIGH (ref 0.0–149.0)
VLDL: 115.8 mg/dL — ABNORMAL HIGH (ref 0.0–40.0)

## 2023-10-16 LAB — COMPREHENSIVE METABOLIC PANEL WITH GFR
ALT: 16 U/L (ref 0–53)
AST: 24 U/L (ref 0–37)
Albumin: 4.2 g/dL (ref 3.5–5.2)
Alkaline Phosphatase: 49 U/L (ref 39–117)
BUN: 15 mg/dL (ref 6–23)
CO2: 28 meq/L (ref 19–32)
Calcium: 8.9 mg/dL (ref 8.4–10.5)
Chloride: 93 meq/L — ABNORMAL LOW (ref 96–112)
Creatinine, Ser: 1.04 mg/dL (ref 0.40–1.50)
GFR: 80.62 mL/min (ref >60.00)
Glucose, Bld: 91 mg/dL (ref 70–99)
Potassium: 4.4 meq/L (ref 3.5–5.1)
Sodium: 129 meq/L — ABNORMAL LOW (ref 135–145)
Total Bilirubin: 0.5 mg/dL (ref 0.2–1.2)
Total Protein: 6.5 g/dL (ref 6.0–8.3)

## 2023-10-16 LAB — IBC + FERRITIN
Ferritin: 425.8 ng/mL — ABNORMAL HIGH (ref 22.0–322.0)
Iron: 150 ug/dL (ref 42–165)
Saturation Ratios: 39.5 % (ref 20.0–50.0)
TIBC: 379.4 ug/dL (ref 250.0–450.0)
Transferrin: 271 mg/dL (ref 212.0–360.0)

## 2023-10-16 LAB — LDL CHOLESTEROL, DIRECT: Direct LDL: 61 mg/dL

## 2023-10-16 MED ORDER — BUDESONIDE-FORMOTEROL FUMARATE 160-4.5 MCG/ACT IN AERO: 2.0000 | INHALATION_SPRAY | Freq: Two times a day (BID) | RESPIRATORY_TRACT | 5 refills | Status: AC

## 2023-10-16 MED ORDER — ESCITALOPRAM OXALATE 10 MG PO TABS: 10.0000 mg | ORAL_TABLET | Freq: Every day | ORAL | 3 refills | Status: AC

## 2023-10-16 NOTE — Progress Notes (Signed)
 Justin Glance, [name] Phone: [phone]  [name] is a 58 y.o. male who presents today for follow up.   Discussed the use of AI scribe software for clinical note transcription with the patient, who gave verbal consent to proceed.  History of Present Illness   [name] is a 58 year old male with hypertension and COPD who presents with right foot swelling and pain.  He has significant swelling and pain in his right foot, particularly across the top of his toes, described as if his toes have been 'jammed'. The swelling worsens with prolonged standing on concrete floors at work, leading to a limp. Pain is not elicited by touch. He uses ibuprofen or naproxen at night for pain and swelling, and finds relief with ice water  soaks. No redness or heat is noted in the area.  He has hypertension, managed with a combination pill of losartan and hydrochlorothiazide . Home blood pressure readings typically range from 130/90 mmHg. Today, his blood pressure was 96/60 mmHg. No dizziness or chest pain.  He has COPD and uses an albuterol  inhaler two to three times a week. He reports a persistent cough and occasional wheezing, which he can manage by coughing. He previously used an Anoro inhaler but discontinued it due to concerns about side effects.  He takes escitalopram  for mood stabilization but wishes to wean off the medication. Previous attempts to stop resulted in significant depression, necessitating resumption of the medication.  He smokes and has declined lung cancer screening due to concerns about radiation exposure. He has a history of using Mirapex  for restless leg syndrome but currently uses muscle relaxers prescribed by an orthopedic specialist.      Social History   Tobacco Use  Smoking Status Every Day   Current packs/day: 1.50   Average packs/day: 1.5 packs/day for 30.0 years (45.0 ttl pk-yrs)   Types: Cigarettes  Smokeless Tobacco Never  Tobacco Comments   1.5 packs daily    Current  Outpatient Medications on File Prior to Visit  Medication Sig Dispense Refill   albuterol  (VENTOLIN  HFA) 108 (90 Base) MCG/ACT inhaler TAKE 2 PUFFS BY MOUTH EVERY 6 HOURS AS NEEDED FOR WHEEZE OR SHORTNESS OF BREATH 18 each 2   cabotegravir  & rilpivirine  ER (CABENUVA ) 600 & 900 MG/3ML injection Inject 1 kit into the muscle every 2 (two) months. 6 mL 5   famciclovir  (FAMVIR ) 500 MG tablet TAKE 1 TABLET BY MOUTH TWICE DAILY DURING A FLARE UP/OUTBREAK UNTIL RESOLVED. 60 tablet 3   fenofibrate  (TRICOR ) 145 MG tablet TAKE 1 TABLET BY MOUTH EVERY DAY 90 tablet 3   fluticasone  (FLONASE ) 50 MCG/ACT nasal spray Place 1 spray into both nostrils daily.     losartan-hydrochlorothiazide  (HYZAAR) 50-12.5 MG tablet Take 1 tablet by mouth daily.     methocarbamol  (ROBAXIN ) 750 MG tablet TAKE 1 TABLET (750 MG TOTAL) BY MOUTH EVERY 6 (SIX) HOURS AS NEEDED FOR MUSCLE SPASMS 60 tablet 5   pramipexole  (MIRAPEX ) 0.5 MG tablet Take 1 tablet (0.5 mg total) by mouth at bedtime. Take 2-3 hours prior to going to bed. 90 tablet 0   No current facility-administered medications on file prior to visit.     ROS see history of present illness  Objective  Physical Exam Vitals:   10/16/23 1009  BP: 96/60  Pulse: (!) 54  Temp: 98.2 F (36.8 C)  SpO2: 92%    BP Readings from Last 3 Encounters:  10/16/23 96/60  09/12/23 125/80  04/17/23 132/86   Wt Readings from  Last 3 Encounters:  10/16/23 185 lb 6.4 oz (84.1 kg)  09/12/23 186 lb (84.4 kg)  04/17/23 185 lb 3.2 oz (84 kg)    Physical Exam Constitutional:      General: He is not in acute distress.    Appearance: Normal appearance.  HENT:     Head: Normocephalic.  Cardiovascular:     Rate and Rhythm: Normal rate and regular rhythm.     Heart sounds: Normal heart sounds.  Pulmonary:     Effort: Pulmonary effort is normal.     Breath sounds: Normal breath sounds.  Musculoskeletal:     Right foot: Decreased range of motion.       Feet:  Feet:     Right  foot:     Skin integrity: Skin integrity normal.     Comments: Tenderness and mild swelling noted across top of foot, below toes. Skin:    General: Skin is warm and dry.  Neurological:     General: No focal deficit present.     Mental Status: He is alert.  Psychiatric:        Mood and Affect: Mood normal.        Behavior: Behavior normal.      Assessment/Plan: Please see individual problem list..  Right foot pain Assessment & Plan: He experiences swelling and pain in the right foot, worsened by prolonged standing. Refer to podiatry for evaluation. Recommend anti-inflammatory medications such as ibuprofen or naproxen. Politely declined x-ray today, consider x-ray if symptoms persist or complete with podiatry.  Orders: -     Ambulatory referral to Podiatry  Essential hypertension Assessment & Plan: His blood pressure today is 96/60 mmHg, lower than usual, but he reports no dizziness. Normally well controlled, at goal. He is on losartan - hydrochlorothiazide . Continue the current antihypertensive regimen and monitor blood pressure regularly. Encourage adequate fluid intake.   Orders: -     Comprehensive metabolic panel with GFR  Centrilobular emphysema (HCC) Assessment & Plan: He uses an albuterol  inhaler 2-3 times a week and is not using the Anoro inhaler due to side effect concerns. He reports a chronic cough and occasional wheezing. Start Symbicort  twice daily. Discuss inhaler side effects and benefits.  Orders: -     Budesonide -Formoterol  Fumarate; Inhale 2 puffs into the lungs 2 (two) times daily.  Dispense: 1 each; Refill: 5  Cigarette nicotine dependence without complication Assessment & Plan: He is a smoker and declined lung cancer screening due to radiation concerns after two years of screening. Discuss smoking cessation options. Not ready to quit at this time. Counseling provided.    Anxiety and depression Assessment & Plan: He is on escitalopram  and wishes to wean  off. He previously experienced worsening depression when stopping. Agreed to decrease the dose to 10 mg with monitoring. Decrease escitalopram  dose to 10 mg. Monitor for worsening symptoms and advise a return to the previous dose if symptoms worsen.  Orders: -     Escitalopram  Oxalate; Take 1 tablet (10 mg total) by mouth daily.  Dispense: 90 tablet; Refill: 3  Hypertriglyceridemia Assessment & Plan: They are on fenofibrate  for hypertriglyceridemia. We will continue fenofibrate  145mg  daily and order a lipid panel to monitor.  Orders: -     Lipid panel -     LDL cholesterol, direct  Elevated ferritin level -     IBC + Ferritin     Return in about 6 months (around 04/16/2024) for Follow up.   Justin Glance, [name] [facility] [employer]

## 2023-10-22 DIAGNOSIS — M7989 Other specified soft tissue disorders: Secondary | ICD-10-CM | POA: Diagnosis not present

## 2023-10-22 DIAGNOSIS — M545 Low back pain, unspecified: Secondary | ICD-10-CM | POA: Diagnosis not present

## 2023-10-27 ENCOUNTER — Encounter: Payer: Self-pay | Admitting: Nurse Practitioner

## 2023-10-27 ENCOUNTER — Ambulatory Visit: Payer: Self-pay | Admitting: Nurse Practitioner

## 2023-10-27 DIAGNOSIS — E781 Pure hyperglyceridemia: Secondary | ICD-10-CM

## 2023-10-27 DIAGNOSIS — M79671 Pain in right foot: Secondary | ICD-10-CM | POA: Insufficient documentation

## 2023-10-27 DIAGNOSIS — E871 Hypo-osmolality and hyponatremia: Secondary | ICD-10-CM

## 2023-10-27 NOTE — Assessment & Plan Note (Signed)
 He uses an albuterol  inhaler 2-3 times a week and is not using the Anoro inhaler due to side effect concerns. He reports a chronic cough and occasional wheezing. Start Symbicort  twice daily. Discuss inhaler side effects and benefits.

## 2023-10-27 NOTE — Assessment & Plan Note (Signed)
 He is on escitalopram  and wishes to wean off. He previously experienced worsening depression when stopping. Agreed to decrease the dose to 10 mg with monitoring. Decrease escitalopram  dose to 10 mg. Monitor for worsening symptoms and advise a return to the previous dose if symptoms worsen.

## 2023-10-27 NOTE — Assessment & Plan Note (Signed)
 They are on fenofibrate for hypertriglyceridemia. We will continue fenofibrate 145mg  daily and order a lipid panel to monitor.

## 2023-10-27 NOTE — Assessment & Plan Note (Addendum)
 He experiences swelling and pain in the right foot, worsened by prolonged standing. Refer to podiatry for evaluation. Recommend anti-inflammatory medications such as ibuprofen or naproxen. Politely declined x-ray today, consider x-ray if symptoms persist or complete with podiatry.

## 2023-10-27 NOTE — Assessment & Plan Note (Signed)
 His blood pressure today is 96/60 mmHg, lower than usual, but he reports no dizziness. Normally well controlled, at goal. He is on losartan - hydrochlorothiazide . Continue the current antihypertensive regimen and monitor blood pressure regularly. Encourage adequate fluid intake.

## 2023-10-27 NOTE — Assessment & Plan Note (Signed)
 He is a smoker and declined lung cancer screening due to radiation concerns after two years of screening. Discuss smoking cessation options. Not ready to quit at this time. Counseling provided.

## 2023-11-10 NOTE — Progress Notes (Signed)
 HPI: [name] is a 58 y.o. male who presents to the [facility] pharmacy clinic for Cabenuva  administration.  Patient Active Problem List   Diagnosis Date Noted   Right foot pain 10/27/2023   Anxiety and depression 04/17/2023   Anal warts 12/17/2022   Hypertriglyceridemia 07/16/2022   Lumbar radiculopathy 07/16/2022   Chronic obstructive pulmonary disease (HCC) 07/16/2022   Healthcare maintenance 03/19/2022   Obstructive sleep apnea 09/30/2021   Restless leg syndrome 09/30/2021   Elevated ferritin level 09/30/2021   Hypokalemia 05/19/2019   Polyp of transverse colon    Trochanteric bursitis of left hip 03/11/2019   Osteoarthritis of knee 03/11/2019   Cigarette nicotine dependence without complication 11/04/2018   Chronic insomnia 11/04/2018   Essential hypertension 03/24/2018   HIV (human immunodeficiency virus infection) (HCC) 03/03/2018    Patient's Medications  New Prescriptions   No medications on file  Previous Medications   ALBUTEROL  (VENTOLIN  HFA) 108 (90 BASE) MCG/ACT INHALER    TAKE 2 PUFFS BY MOUTH EVERY 6 HOURS AS NEEDED FOR WHEEZE OR SHORTNESS OF BREATH   BUDESONIDE -FORMOTEROL  (SYMBICORT ) 160-4.5 MCG/ACT INHALER    Inhale 2 puffs into the lungs 2 (two) times daily.   CABOTEGRAVIR  & RILPIVIRINE  ER (CABENUVA ) 600 & 900 MG/3ML INJECTION    Inject 1 kit into the muscle every 2 (two) months.   ESCITALOPRAM  (LEXAPRO ) 10 MG TABLET    Take 1 tablet (10 mg total) by mouth daily.   FAMCICLOVIR  (FAMVIR ) 500 MG TABLET    TAKE 1 TABLET BY MOUTH TWICE DAILY DURING A FLARE UP/OUTBREAK UNTIL RESOLVED.   FENOFIBRATE  (TRICOR ) 145 MG TABLET    TAKE 1 TABLET BY MOUTH EVERY DAY   FLUTICASONE  (FLONASE ) 50 MCG/ACT NASAL SPRAY    Place 1 spray into both nostrils daily.   LOSARTAN-HYDROCHLOROTHIAZIDE  (HYZAAR) 50-12.5 MG TABLET    Take 1 tablet by mouth daily.   METHOCARBAMOL  (ROBAXIN ) 750 MG TABLET    TAKE 1 TABLET (750 MG TOTAL) BY MOUTH EVERY 6 (SIX) HOURS AS NEEDED FOR MUSCLE SPASMS    PRAMIPEXOLE  (MIRAPEX ) 0.5 MG TABLET    Take 1 tablet (0.5 mg total) by mouth at bedtime. Take 2-3 hours prior to going to bed.  Modified Medications   No medications on file  Discontinued Medications   No medications on file    Allergies: Allergies  Allergen Reactions   Other     Dust mites and roaches     Labs: Lab Results  Component Value Date   HIV1RNAQUANT NOT DETECTED 09/12/2023   HIV1RNAQUANT <20 (H) 03/17/2023   HIV1RNAQUANT Not Detected 11/14/2022   CD4TABS 891 03/17/2023   CD4TABS 865 08/27/2022   CD4TABS 562 03/19/2022    RPR and STI Lab Results  Component Value Date   LABRPR NON-REACTIVE 03/17/2023   LABRPR NON-REACTIVE 08/27/2022   LABRPR NON-REACTIVE 03/19/2022   LABRPR NON-REACTIVE 03/05/2022    STI Results GC CT  08/27/2022 10:44 AM Negative  Negative   03/05/2022  9:44 AM Negative  Negative     Hepatitis B Lab Results  Component Value Date   HEPBSAB REACTIVE (A) 03/05/2022   HEPBSAG NON-REACTIVE 03/05/2022   HEPBCAB NON-REACTIVE 03/05/2022   Hepatitis C Lab Results  Component Value Date   HEPCAB NON-REACTIVE 03/05/2022   Hepatitis A Lab Results  Component Value Date   HAV REACTIVE (A) 03/05/2022   Lipids: Lab Results  Component Value Date   CHOL 148 10/16/2023   TRIG (H) 10/16/2023    579.0 Triglyceride is over 400;  calculations on Lipids are invalid.   HDL 35.00 (L) 10/16/2023   CHOLHDL 4 10/16/2023   VLDL 115.8 (H) 10/16/2023   LDLCALC  03/19/2022     Comment:     . LDL cholesterol not calculated. Triglyceride levels greater than 400 mg/dL invalidate calculated LDL results. . Reference range: <100 . Desirable range <100 mg/dL for primary prevention;   <70 mg/dL for patients with CHD or diabetic patients  with > or = 2 CHD risk factors. SABRA LDL-C is now calculated using the Martin-Hopkins  calculation, which is a validated novel method providing  better accuracy than the Friedewald equation in the  estimation of LDL-C.   Gladis APPLETHWAITE et al. SANDREA. 7986;689(80): 2061-2068  (http://education.[employer].com/faq/FAQ164)     TARGET DATE: The 29th  Assessment: [name] presents today for his maintenance Cabenuva  injections. Past injections were tolerated well without issues. Last HIV RNA was not detected in May. Doing well with no issues today.  Administered cabotegravir  600mg /30mL in left upper outer quadrant of the gluteal muscle. Administered rilpivirine  900 mg/3mL in the right upper outer quadrant of the gluteal muscle. No issues with injections. He will follow up in 2 months for next set of injections.  Plan: - Cabenuva  injections administered - Next injections scheduled for 01/13/24 - Call with any issues or questions  [name] L. Harl Wiechmann, PharmD, BCIDP, AAHIVP, CPP Clinical Pharmacist Practitioner - Infectious Diseases Clinical Pharmacist Lead - Specialty Pharmacy [facility] for Infectious Disease

## 2023-11-11 ENCOUNTER — Ambulatory Visit: Payer: Self-pay | Admitting: Pharmacist

## 2023-11-11 ENCOUNTER — Other Ambulatory Visit: Payer: Self-pay

## 2023-11-11 ENCOUNTER — Telehealth: Payer: Self-pay

## 2023-11-11 DIAGNOSIS — Z21 Asymptomatic human immunodeficiency virus [HIV] infection status: Secondary | ICD-10-CM | POA: Diagnosis not present

## 2023-11-11 MED ORDER — CABOTEGRAVIR & RILPIVIRINE ER 600 & 900 MG/3ML IM SUER
1.0000 | Freq: Once | INTRAMUSCULAR | Status: AC
  Administered 2023-11-11: 1 via INTRAMUSCULAR

## 2023-11-11 NOTE — Telephone Encounter (Signed)
 [facility] Patient Advocate Encounter  Patient's medications Cabenuva  have been couriered to [facility] from [facility] and will be administered at the patients appointment on 11/11/23.  Arland Hutchinson, CPhT Specialty Pharmacy Patient [facility] for Infectious Disease Phone: [phone] Fax:  [phone]

## 2024-01-07 ENCOUNTER — Telehealth: Payer: Self-pay

## 2024-01-07 NOTE — Telephone Encounter (Signed)
 [facility] Patient Advocate Encounter  Patient's medications CABENUVA  have been couriered to [facility] from [facility] and will be administered at the patients appointment on 01/13/24.  Charmaine Sharps, CPhT Specialty Pharmacy Patient [facility] for Infectious Disease Phone: [phone] Fax:  [phone]

## 2024-01-13 ENCOUNTER — Ambulatory Visit: Payer: Self-pay | Admitting: Pharmacist

## 2024-01-19 ENCOUNTER — Ambulatory Visit: Payer: Self-pay | Admitting: Pharmacist

## 2024-01-20 NOTE — Progress Notes (Unsigned)
 HPI: [name] is a 58 y.o. male who presents to the [facility] pharmacy clinic for Cabenuva  administration.  Patient Active Problem List   Diagnosis Date Noted   Right foot pain 10/27/2023   Anxiety and depression 04/17/2023   Anal warts 12/17/2022   Hypertriglyceridemia 07/16/2022   Lumbar radiculopathy 07/16/2022   Chronic obstructive pulmonary disease (HCC) 07/16/2022   Healthcare maintenance 03/19/2022   Obstructive sleep apnea 09/30/2021   Restless leg syndrome 09/30/2021   Elevated ferritin level 09/30/2021   Hypokalemia 05/19/2019   Polyp of transverse colon    Trochanteric bursitis of left hip 03/11/2019   Osteoarthritis of knee 03/11/2019   Cigarette nicotine dependence without complication 11/04/2018   Chronic insomnia 11/04/2018   Essential hypertension 03/24/2018   HIV (human immunodeficiency virus infection) (HCC) 03/03/2018    Patient's Medications  New Prescriptions   No medications on file  Previous Medications   ALBUTEROL  (VENTOLIN  HFA) 108 (90 BASE) MCG/ACT INHALER    TAKE 2 PUFFS BY MOUTH EVERY 6 HOURS AS NEEDED FOR WHEEZE OR SHORTNESS OF BREATH   BUDESONIDE -FORMOTEROL  (SYMBICORT ) 160-4.5 MCG/ACT INHALER    Inhale 2 puffs into the lungs 2 (two) times daily.   CABOTEGRAVIR  & RILPIVIRINE  ER (CABENUVA ) 600 & 900 MG/3ML INJECTION    Inject 1 kit into the muscle every 2 (two) months.   ESCITALOPRAM  (LEXAPRO ) 10 MG TABLET    Take 1 tablet (10 mg total) by mouth daily.   FAMCICLOVIR  (FAMVIR ) 500 MG TABLET    TAKE 1 TABLET BY MOUTH TWICE DAILY DURING A FLARE UP/OUTBREAK UNTIL RESOLVED.   FENOFIBRATE  (TRICOR ) 145 MG TABLET    TAKE 1 TABLET BY MOUTH EVERY DAY   FLUTICASONE  (FLONASE ) 50 MCG/ACT NASAL SPRAY    Place 1 spray into both nostrils daily.   LOSARTAN-HYDROCHLOROTHIAZIDE  (HYZAAR) 50-12.5 MG TABLET    Take 1 tablet by mouth daily.   METHOCARBAMOL  (ROBAXIN ) 750 MG TABLET    TAKE 1 TABLET (750 MG TOTAL) BY MOUTH EVERY 6 (SIX) HOURS AS NEEDED FOR MUSCLE SPASMS    PRAMIPEXOLE  (MIRAPEX ) 0.5 MG TABLET    Take 1 tablet (0.5 mg total) by mouth at bedtime. Take 2-3 hours prior to going to bed.  Modified Medications   No medications on file  Discontinued Medications   No medications on file    Allergies: Allergies  Allergen Reactions   Other     Dust mites and roaches     Labs: Lab Results  Component Value Date   HIV1RNAQUANT NOT DETECTED 09/12/2023   HIV1RNAQUANT <20 (H) 03/17/2023   HIV1RNAQUANT Not Detected 11/14/2022   CD4TABS 891 03/17/2023   CD4TABS 865 08/27/2022   CD4TABS 562 03/19/2022    RPR and STI Lab Results  Component Value Date   LABRPR NON-REACTIVE 03/17/2023   LABRPR NON-REACTIVE 08/27/2022   LABRPR NON-REACTIVE 03/19/2022   LABRPR NON-REACTIVE 03/05/2022    STI Results GC CT  08/27/2022 10:44 AM Negative  Negative   03/05/2022  9:44 AM Negative  Negative     Hepatitis B Lab Results  Component Value Date   HEPBSAB REACTIVE (A) 03/05/2022   HEPBSAG NON-REACTIVE 03/05/2022   HEPBCAB NON-REACTIVE 03/05/2022   Hepatitis C Lab Results  Component Value Date   HEPCAB NON-REACTIVE 03/05/2022   Hepatitis A Lab Results  Component Value Date   HAV REACTIVE (A) 03/05/2022   Lipids: Lab Results  Component Value Date   CHOL 148 10/16/2023   TRIG (H) 10/16/2023    579.0 Triglyceride is over 400;  calculations on Lipids are invalid.   HDL 35.00 (L) 10/16/2023   CHOLHDL 4 10/16/2023   VLDL 115.8 (H) 10/16/2023   LDLCALC  03/19/2022     Comment:     . LDL cholesterol not calculated. Triglyceride levels greater than 400 mg/dL invalidate calculated LDL results. . Reference range: <100 . Desirable range <100 mg/dL for primary prevention;   <70 mg/dL for patients with CHD or diabetic patients  with > or = 2 CHD risk factors. SABRA LDL-C is now calculated using the Martin-Hopkins  calculation, which is a validated novel method providing  better accuracy than the Friedewald equation in the  estimation of LDL-C.   Gladis APPLETHWAITE et al. SANDREA. 7986;689(80): 2061-2068  (http://education.[facility].com/faq/FAQ164)     TARGET DATE: The 29th  Assessment: [name] presents today for his maintenance Cabenuva  injections. Past injections were tolerated well without issues. Last HIV RNA was not detected in May. He complains of fatigue today and is asking if it is associated with Cabenuva . Explained that about 4-5% of patients experienced fatigue in the trials. He states it has been ongoing since the Spring, but he wishes to continue the injections for now. Declines annual flu and COVID vaccines today.   Administered cabotegravir  600mg /44mL in left upper outer quadrant of the gluteal muscle. Administered rilpivirine  900 mg/3mL in the right upper outer quadrant of the gluteal muscle. No issues with injections. He will follow up in 2 months for next set of injections.  Plan: - Cabenuva  injections administered - Next injections scheduled for 03/15/24 with Cathlyn and 05/25/24 with  me - Call with any issues or questions  [name] L. Asiah Browder, PharmD, BCIDP, AAHIVP, CPP Clinical Pharmacist Practitioner - Infectious Diseases Clinical Pharmacist Lead - Specialty Pharmacy [facility]

## 2024-01-21 ENCOUNTER — Other Ambulatory Visit: Payer: Self-pay

## 2024-01-21 ENCOUNTER — Ambulatory Visit (INDEPENDENT_AMBULATORY_CARE_PROVIDER_SITE_OTHER): Payer: Self-pay | Admitting: Pharmacist

## 2024-01-21 DIAGNOSIS — Z21 Asymptomatic human immunodeficiency virus [HIV] infection status: Secondary | ICD-10-CM | POA: Diagnosis not present

## 2024-01-21 MED ORDER — CABOTEGRAVIR & RILPIVIRINE ER 600 & 900 MG/3ML IM SUER
1.0000 | Freq: Once | INTRAMUSCULAR | Status: AC
  Administered 2024-01-21: 1 via INTRAMUSCULAR

## 2024-01-30 ENCOUNTER — Other Ambulatory Visit: Payer: Self-pay | Admitting: Nurse Practitioner

## 2024-02-03 ENCOUNTER — Encounter: Payer: Self-pay | Admitting: Acute Care

## 2024-02-04 ENCOUNTER — Telehealth: Payer: Self-pay

## 2024-02-04 NOTE — Telephone Encounter (Signed)
 Copied from CRM #[id number]. Topic: Referral - Question >> Feb 03, 2024  4:12 PM Nurse Isaiah SQUIBB wrote: [name]-patient returned your call.

## 2024-02-04 NOTE — Telephone Encounter (Signed)
 Pt does not want to continue with the [facility] CTs. I will document & close the order.

## 2024-03-01 ENCOUNTER — Telehealth: Payer: Self-pay

## 2024-03-01 NOTE — Telephone Encounter (Signed)
 Left patient a voice mail to call back to schedule a lab appointment before office visit scheduled for 11/24 with Gregory Calone.

## 2024-03-01 NOTE — Telephone Encounter (Signed)
-----   Message from Nurse Enis S sent at 02/26/2024  2:05 PM EST ----- Regarding: lab appt. needed Patient LVM in triage requesting lab appt prior to visit on 11/24 with Calone. Please contact patient for appt.  thanks

## 2024-03-02 ENCOUNTER — Other Ambulatory Visit

## 2024-03-02 ENCOUNTER — Other Ambulatory Visit: Payer: Self-pay

## 2024-03-02 DIAGNOSIS — Z21 Asymptomatic human immunodeficiency virus [HIV] infection status: Secondary | ICD-10-CM

## 2024-03-02 NOTE — Telephone Encounter (Signed)
 Pt called back to schedule lab appt. Will come in today at 20. Does not want to do STD testing.  Lorenda CHRISTELLA Code, RMA

## 2024-03-03 ENCOUNTER — Telehealth: Payer: Self-pay

## 2024-03-03 LAB — T-HELPER CELL (CD4) - (RCID CLINIC ONLY)
CD4 % Helper T Cell: 41 % (ref 33–65)
CD4 T Cell Abs: 725 /uL (ref 400–1790)

## 2024-03-03 NOTE — Telephone Encounter (Signed)
 [facility] Patient Advocate Encounter  Patient's medications CABENUVA  have been couriered to [facility] from [facility] and will be administered at the patients appointment on 03/15/24.  Charmaine Sharps, CPhT Specialty Pharmacy Patient [facility] for Infectious Disease Phone: [phone] Fax:  [phone]

## 2024-03-04 LAB — COMPREHENSIVE METABOLIC PANEL WITH GFR
AG Ratio: 1.7 (calc) (ref 1.0–2.5)
ALT: 18 U/L (ref 9–46)
AST: 23 U/L (ref 10–35)
Albumin: 4.1 g/dL (ref 3.6–5.1)
Alkaline phosphatase (APISO): 64 U/L (ref 35–144)
BUN: 13 mg/dL (ref 7–25)
CO2: 27 mmol/L (ref 20–32)
Calcium: 9.1 mg/dL (ref 8.6–10.3)
Chloride: 100 mmol/L (ref 98–110)
Creat: 1.07 mg/dL (ref 0.70–1.30)
Globulin: 2.4 g/dL (ref 1.9–3.7)
Glucose, Bld: 96 mg/dL (ref 65–99)
Potassium: 4.3 mmol/L (ref 3.5–5.3)
Sodium: 134 mmol/L — ABNORMAL LOW (ref 135–146)
Total Bilirubin: 0.4 mg/dL (ref 0.2–1.2)
Total Protein: 6.5 g/dL (ref 6.1–8.1)
eGFR: 81 mL/min/1.73m2 (ref >=60)

## 2024-03-04 LAB — HIV-1 RNA QUANT-NO REFLEX-BLD
HIV 1 RNA Quant: NOT DETECTED {copies}/mL
HIV-1 RNA Quant, Log: NOT DETECTED {Log_copies}/mL

## 2024-03-15 ENCOUNTER — Ambulatory Visit: Admitting: Family

## 2024-03-15 NOTE — Progress Notes (Unsigned)
 HPI: [name] is a 58 y.o. male who presents to the [facility] pharmacy clinic for Cabenuva  administration.  Referring ID Provider: Cathlyn July, NP   Patient Active Problem List   Diagnosis Date Noted   Right foot pain 10/27/2023   Anxiety and depression 04/17/2023   Anal warts 12/17/2022   Hypertriglyceridemia 07/16/2022   Lumbar radiculopathy 07/16/2022   Chronic obstructive pulmonary disease (HCC) 07/16/2022   Healthcare maintenance 03/19/2022   Obstructive sleep apnea 09/30/2021   Restless leg syndrome 09/30/2021   Elevated ferritin level 09/30/2021   Hypokalemia 05/19/2019   Polyp of transverse colon    Trochanteric bursitis of left hip 03/11/2019   Osteoarthritis of knee 03/11/2019   Cigarette nicotine dependence without complication 11/04/2018   Chronic insomnia 11/04/2018   Essential hypertension 03/24/2018   HIV (human immunodeficiency virus infection) (HCC) 03/03/2018    Patient's Medications  New Prescriptions   No medications on file  Previous Medications   ALBUTEROL  (VENTOLIN  HFA) 108 (90 BASE) MCG/ACT INHALER    TAKE 2 PUFFS BY MOUTH EVERY 6 HOURS AS NEEDED FOR WHEEZE OR SHORTNESS OF BREATH   BUDESONIDE -FORMOTEROL  (SYMBICORT ) 160-4.5 MCG/ACT INHALER    Inhale 2 puffs into the lungs 2 (two) times daily.   CABOTEGRAVIR  & RILPIVIRINE  ER (CABENUVA ) 600 & 900 MG/3ML INJECTION    Inject 1 kit into the muscle every 2 (two) months.   ESCITALOPRAM  (LEXAPRO ) 10 MG TABLET    Take 1 tablet (10 mg total) by mouth daily.   FAMCICLOVIR  (FAMVIR ) 500 MG TABLET    TAKE 1 TABLET BY MOUTH TWICE DAILY DURING A FLARE UP/OUTBREAK UNTIL RESOLVED.   FENOFIBRATE  (TRICOR ) 145 MG TABLET    TAKE 1 TABLET BY MOUTH EVERY DAY   FLUTICASONE  (FLONASE ) 50 MCG/ACT NASAL SPRAY    Place 1 spray into both nostrils daily.   LOSARTAN-HYDROCHLOROTHIAZIDE  (HYZAAR) 50-12.5 MG TABLET    Take 1 tablet by mouth daily.   METHOCARBAMOL  (ROBAXIN ) 750 MG TABLET    TAKE 1 TABLET (750 MG TOTAL) BY MOUTH EVERY 6 (SIX)  HOURS AS NEEDED FOR MUSCLE SPASMS   PRAMIPEXOLE  (MIRAPEX ) 0.5 MG TABLET    Take 1 tablet (0.5 mg total) by mouth at bedtime. Take 2-3 hours prior to going to bed.  Modified Medications   No medications on file  Discontinued Medications   No medications on file    Allergies: Allergies  Allergen Reactions   Other     Dust mites and roaches     Labs: Lab Results  Component Value Date   HIV1RNAQUANT NOT DETECTED 03/02/2024   HIV1RNAQUANT NOT DETECTED 09/12/2023   HIV1RNAQUANT <20 (H) 03/17/2023   CD4TABS 725 03/02/2024   CD4TABS 891 03/17/2023   CD4TABS 865 08/27/2022    RPR and STI Lab Results  Component Value Date   LABRPR NON-REACTIVE 03/17/2023   LABRPR NON-REACTIVE 08/27/2022   LABRPR NON-REACTIVE 03/19/2022   LABRPR NON-REACTIVE 03/05/2022    STI Results GC CT  08/27/2022 10:44 AM Negative  Negative   03/05/2022  9:44 AM Negative  Negative     Hepatitis B Lab Results  Component Value Date   HEPBSAB REACTIVE (A) 03/05/2022   HEPBSAG NON-REACTIVE 03/05/2022   HEPBCAB NON-REACTIVE 03/05/2022   Hepatitis C Lab Results  Component Value Date   HEPCAB NON-REACTIVE 03/05/2022   Hepatitis A Lab Results  Component Value Date   HAV REACTIVE (A) 03/05/2022   Lipids: Lab Results  Component Value Date   CHOL 148 10/16/2023   TRIG (H) 10/16/2023  579.0 Triglyceride is over 400; calculations on Lipids are invalid.   HDL 35.00 (L) 10/16/2023   CHOLHDL 4 10/16/2023   VLDL 115.8 (H) 10/16/2023   LDLCALC  03/19/2022     Comment:     . LDL cholesterol not calculated. Triglyceride levels greater than 400 mg/dL invalidate calculated LDL results. . Reference range: <100 . Desirable range <100 mg/dL for primary prevention;   <70 mg/dL for patients with CHD or diabetic patients  with > or = 2 CHD risk factors. SABRA LDL-C is now calculated using the Martin-Hopkins  calculation, which is a validated novel method providing  better accuracy than the Friedewald  equation in the  estimation of LDL-C.  Gladis APPLETHWAITE et al. SANDREA. 7986;689(80): 2061-2068  (http://education.[facility].com/faq/FAQ164)     Target Date: 29th  Assessment: [name] presents today for his maintenance Cabenuva  injections. Past injections were tolerated well without issues. Last HIV RNA was undetectable on 03/02/2024. Doing well with no issues today.  Lab work:  - None today, declined STI testing  Eligible vaccinations: Influenza, Covid  - Patient politely declines vaccines today   Cabenuva : Administered cabotegravir  600mg /29mL in left upper outer quadrant of the gluteal muscle. Administered rilpivirine  900 mg/3mL in the right upper outer quadrant of the gluteal muscle. No issues with injections. [name] will follow up in 2 months for next set of injections.  Plan: - Cabenuva  injections administered - Next injections scheduled for 05/17/2024 with Cathlyn and 07/12/2024 with Cassie  - Call with any issues or questions  Feliciano Close, PharmD PGY2 Infectious Diseases Pharmacy Resident

## 2024-03-16 ENCOUNTER — Other Ambulatory Visit: Payer: Self-pay

## 2024-03-16 ENCOUNTER — Ambulatory Visit (INDEPENDENT_AMBULATORY_CARE_PROVIDER_SITE_OTHER): Admitting: Pharmacist

## 2024-03-16 DIAGNOSIS — Z113 Encounter for screening for infections with a predominantly sexual mode of transmission: Secondary | ICD-10-CM

## 2024-03-16 DIAGNOSIS — Z21 Asymptomatic human immunodeficiency virus [HIV] infection status: Secondary | ICD-10-CM

## 2024-03-16 MED ORDER — CABOTEGRAVIR & RILPIVIRINE ER 600 & 900 MG/3ML IM SUER
1.0000 | Freq: Once | INTRAMUSCULAR | Status: AC
  Administered 2024-03-16: 1 via INTRAMUSCULAR

## 2024-04-16 ENCOUNTER — Ambulatory Visit: Admitting: Nurse Practitioner

## 2024-04-28 ENCOUNTER — Other Ambulatory Visit (HOSPITAL_COMMUNITY): Payer: Self-pay

## 2024-05-03 ENCOUNTER — Other Ambulatory Visit (HOSPITAL_COMMUNITY): Payer: Self-pay

## 2024-05-03 ENCOUNTER — Telehealth: Payer: Self-pay

## 2024-05-03 NOTE — Telephone Encounter (Signed)
 Pharmacy Patient Advocate Encounter- Cabenuva  BIV-Medical Benefit:  J code: [id number]  CPT code: 03627  Dx Code: B20  NO PA was submitted to [facility] portal the plan may require clinical chart notes to be submitted with claims.

## 2024-05-17 ENCOUNTER — Ambulatory Visit: Payer: Self-pay | Admitting: Family

## 2024-05-18 ENCOUNTER — Ambulatory Visit: Admitting: Pharmacist

## 2024-05-18 ENCOUNTER — Other Ambulatory Visit: Payer: Self-pay

## 2024-05-18 DIAGNOSIS — Z21 Asymptomatic human immunodeficiency virus [HIV] infection status: Secondary | ICD-10-CM | POA: Diagnosis not present

## 2024-05-18 MED ORDER — CABOTEGRAVIR & RILPIVIRINE ER 600 & 900 MG/3ML IM SUER
1.0000 | Freq: Once | INTRAMUSCULAR | Status: AC
  Administered 2024-05-18: 1 via INTRAMUSCULAR

## 2024-05-18 NOTE — Progress Notes (Signed)
 [name]

## 2024-05-18 NOTE — Progress Notes (Signed)
 "  HPI: [name] is a 59 y.o. male who presents to the [facility] pharmacy clinic for Cabenuva  administration.  Referring ID Provider: Cathlyn July, NP  Patient Active Problem List   Diagnosis Date Noted   Right foot pain 10/27/2023   Anxiety and depression 04/17/2023   Anal warts 12/17/2022   Hypertriglyceridemia 07/16/2022   Lumbar radiculopathy 07/16/2022   Chronic obstructive pulmonary disease (HCC) 07/16/2022   Healthcare maintenance 03/19/2022   Obstructive sleep apnea 09/30/2021   Restless leg syndrome 09/30/2021   Elevated ferritin level 09/30/2021   Hypokalemia 05/19/2019   Polyp of transverse colon    Trochanteric bursitis of left hip 03/11/2019   Osteoarthritis of knee 03/11/2019   Cigarette nicotine dependence without complication 11/04/2018   Chronic insomnia 11/04/2018   Essential hypertension 03/24/2018   HIV (human immunodeficiency virus infection) (HCC) 03/03/2018    Patient's Medications  New Prescriptions   No medications on file  Previous Medications   ALBUTEROL  (VENTOLIN  HFA) 108 (90 BASE) MCG/ACT INHALER    TAKE 2 PUFFS BY MOUTH EVERY 6 HOURS AS NEEDED FOR WHEEZE OR SHORTNESS OF BREATH   BUDESONIDE -FORMOTEROL  (SYMBICORT ) 160-4.5 MCG/ACT INHALER    Inhale 2 puffs into the lungs 2 (two) times daily.   CABOTEGRAVIR  & RILPIVIRINE  ER (CABENUVA ) 600 & 900 MG/3ML INJECTION    Inject 1 kit into the muscle every 2 (two) months.   ESCITALOPRAM  (LEXAPRO ) 10 MG TABLET    Take 1 tablet (10 mg total) by mouth daily.   FAMCICLOVIR  (FAMVIR ) 500 MG TABLET    TAKE 1 TABLET BY MOUTH TWICE DAILY DURING A FLARE UP/OUTBREAK UNTIL RESOLVED.   FENOFIBRATE  (TRICOR ) 145 MG TABLET    TAKE 1 TABLET BY MOUTH EVERY DAY   FLUTICASONE  (FLONASE ) 50 MCG/ACT NASAL SPRAY    Place 1 spray into both nostrils daily.   LOSARTAN-HYDROCHLOROTHIAZIDE  (HYZAAR) 50-12.5 MG TABLET    Take 1 tablet by mouth daily.   METHOCARBAMOL  (ROBAXIN ) 750 MG TABLET    TAKE 1 TABLET (750 MG TOTAL) BY MOUTH EVERY 6 (SIX)  HOURS AS NEEDED FOR MUSCLE SPASMS   PRAMIPEXOLE  (MIRAPEX ) 0.5 MG TABLET    Take 1 tablet (0.5 mg total) by mouth at bedtime. Take 2-3 hours prior to going to bed.  Modified Medications   No medications on file  Discontinued Medications   No medications on file    Allergies: Allergies[1]  Labs: Lab Results  Component Value Date   HIV1RNAQUANT NOT DETECTED 03/02/2024   HIV1RNAQUANT NOT DETECTED 09/12/2023   HIV1RNAQUANT <20 (H) 03/17/2023   CD4TABS 725 03/02/2024   CD4TABS 891 03/17/2023   CD4TABS 865 08/27/2022    RPR and STI Lab Results  Component Value Date   LABRPR NON-REACTIVE 03/17/2023   LABRPR NON-REACTIVE 08/27/2022   LABRPR NON-REACTIVE 03/19/2022   LABRPR NON-REACTIVE 03/05/2022    STI Results GC CT  08/27/2022 10:44 AM Negative  Negative   03/05/2022  9:44 AM Negative  Negative     Hepatitis B Lab Results  Component Value Date   HEPBSAB REACTIVE (A) 03/05/2022   HEPBSAG NON-REACTIVE 03/05/2022   HEPBCAB NON-REACTIVE 03/05/2022   Hepatitis C Lab Results  Component Value Date   HEPCAB NON-REACTIVE 03/05/2022   Hepatitis A Lab Results  Component Value Date   HAV REACTIVE (A) 03/05/2022   Lipids: Lab Results  Component Value Date   CHOL 148 10/16/2023   TRIG (H) 10/16/2023    579.0 Triglyceride is over 400; calculations on Lipids are invalid.   HDL 35.00 (  L) 10/16/2023   CHOLHDL 4 10/16/2023   VLDL 115.8 (H) 10/16/2023   LDLCALC  03/19/2022     Comment:     . LDL cholesterol not calculated. Triglyceride levels greater than 400 mg/dL invalidate calculated LDL results. . Reference range: <100 . Desirable range <100 mg/dL for primary prevention;   <70 mg/dL for patients with CHD or diabetic patients  with > or = 2 CHD risk factors. SABRA LDL-C is now calculated using the Martin-Hopkins  calculation, which is a validated novel method providing  better accuracy than the Friedewald equation in the  estimation of LDL-C.  Gladis APPLETHWAITE et al. SANDREA.  7986;689(80): 2061-2068  (http://education.[facility].com/faq/FAQ164)     Target Date: 29th  Assessment: Ala presents today for his maintenance Cabenuva  injections. Past injections were tolerated well without issues. Last HIV RNA was negative on 03/02/2024. Doing well with no issues today.  Lab work:  - No labs today - Patient politely declines STI testing   Eligible vaccinations: Flu - Patient politely declines flu vaccine   Cabenuva : Administered cabotegravir  600mg /7mL in left upper outer quadrant of the gluteal muscle. Administered rilpivirine  900 mg/3mL in the right upper outer quadrant of the gluteal muscle. No issues with injections. Vu will follow up in 2 months for next set of injections.  Plan: - Cabenuva  injections administered - Next injections scheduled for 07/13/2024 with Cathlyn and 09/14/2024 with Cassie  - Call with any issues or questions     [1]  Allergies Allergen Reactions   Other     Dust mites and roaches    "

## 2024-05-25 ENCOUNTER — Ambulatory Visit: Payer: Self-pay | Admitting: Pharmacist

## 2024-06-08 ENCOUNTER — Ambulatory Visit: Admitting: Nurse Practitioner

## 2024-07-12 ENCOUNTER — Ambulatory Visit: Admitting: Pharmacist

## 2024-07-13 ENCOUNTER — Ambulatory Visit: Payer: Self-pay | Admitting: Family

## 2024-09-14 ENCOUNTER — Ambulatory Visit: Admitting: Pharmacist
